# Patient Record
Sex: Male | Born: 1950 | Race: White | Hispanic: No | Marital: Married | Smoking: Former smoker
Health system: Southern US, Community
[De-identification: ages and names within clinical notes are randomized; demographics above are authoritative.]

## PROBLEM LIST (undated history)

## (undated) DIAGNOSIS — K589 Irritable bowel syndrome without diarrhea: Secondary | ICD-10-CM

## (undated) DIAGNOSIS — K219 Gastro-esophageal reflux disease without esophagitis: Secondary | ICD-10-CM

## (undated) DIAGNOSIS — R7303 Prediabetes: Secondary | ICD-10-CM

## (undated) DIAGNOSIS — R0989 Other specified symptoms and signs involving the circulatory and respiratory systems: Secondary | ICD-10-CM

## (undated) DIAGNOSIS — J449 Chronic obstructive pulmonary disease, unspecified: Secondary | ICD-10-CM

## (undated) DIAGNOSIS — Z8639 Personal history of other endocrine, nutritional and metabolic disease: Secondary | ICD-10-CM

## (undated) DIAGNOSIS — D126 Benign neoplasm of colon, unspecified: Secondary | ICD-10-CM

## (undated) DIAGNOSIS — E785 Hyperlipidemia, unspecified: Secondary | ICD-10-CM

## (undated) DIAGNOSIS — E559 Vitamin D deficiency, unspecified: Secondary | ICD-10-CM

## (undated) HISTORY — DX: Vitamin D deficiency, unspecified: E55.9

## (undated) HISTORY — DX: Personal history of other endocrine, nutritional and metabolic disease: Z86.39

## (undated) HISTORY — DX: Other specified symptoms and signs involving the circulatory and respiratory systems: R09.89

## (undated) HISTORY — DX: Prediabetes: R73.03

## (undated) HISTORY — PX: TESTICLE SURGERY: SHX794

## (undated) HISTORY — DX: Irritable bowel syndrome, unspecified: K58.9

## (undated) HISTORY — DX: Chronic obstructive pulmonary disease, unspecified: J44.9

## (undated) HISTORY — DX: Benign neoplasm of colon, unspecified: D12.6

## (undated) HISTORY — DX: Gastro-esophageal reflux disease without esophagitis: K21.9

---

## 1998-04-06 ENCOUNTER — Ambulatory Visit (HOSPITAL_COMMUNITY): Admission: RE | Admit: 1998-04-06 | Discharge: 1998-04-06 | Payer: Self-pay | Admitting: Internal Medicine

## 1998-08-27 ENCOUNTER — Ambulatory Visit (HOSPITAL_COMMUNITY): Admission: RE | Admit: 1998-08-27 | Discharge: 1998-08-27 | Payer: Self-pay | Admitting: Urology

## 1999-09-11 ENCOUNTER — Encounter: Payer: Self-pay | Admitting: Internal Medicine

## 1999-09-11 ENCOUNTER — Ambulatory Visit (HOSPITAL_COMMUNITY): Admission: RE | Admit: 1999-09-11 | Discharge: 1999-09-11 | Payer: Self-pay | Admitting: Internal Medicine

## 2001-10-25 ENCOUNTER — Ambulatory Visit (HOSPITAL_COMMUNITY): Admission: RE | Admit: 2001-10-25 | Discharge: 2001-10-25 | Payer: Self-pay | Admitting: Internal Medicine

## 2001-10-25 ENCOUNTER — Encounter: Payer: Self-pay | Admitting: Internal Medicine

## 2004-12-30 ENCOUNTER — Ambulatory Visit (HOSPITAL_COMMUNITY): Admission: RE | Admit: 2004-12-30 | Discharge: 2004-12-30 | Payer: Self-pay | Admitting: Internal Medicine

## 2005-12-31 ENCOUNTER — Ambulatory Visit (HOSPITAL_COMMUNITY): Admission: RE | Admit: 2005-12-31 | Discharge: 2005-12-31 | Payer: Self-pay | Admitting: Internal Medicine

## 2006-09-01 HISTORY — PX: BASAL CELL CARCINOMA EXCISION: SHX1214

## 2008-10-19 ENCOUNTER — Encounter (INDEPENDENT_AMBULATORY_CARE_PROVIDER_SITE_OTHER): Payer: Self-pay | Admitting: *Deleted

## 2011-03-06 ENCOUNTER — Other Ambulatory Visit (HOSPITAL_COMMUNITY): Payer: Self-pay | Admitting: Internal Medicine

## 2011-03-06 ENCOUNTER — Ambulatory Visit (HOSPITAL_COMMUNITY)
Admission: RE | Admit: 2011-03-06 | Discharge: 2011-03-06 | Disposition: A | Discharge status: Home / Self Care | Payer: Managed Care, Other (non HMO) | Source: Ambulatory Visit | Attending: Internal Medicine | Admitting: Internal Medicine

## 2011-03-06 DIAGNOSIS — R0602 Shortness of breath: Secondary | ICD-10-CM | POA: Insufficient documentation

## 2011-03-06 DIAGNOSIS — R059 Cough, unspecified: Secondary | ICD-10-CM | POA: Insufficient documentation

## 2011-03-06 DIAGNOSIS — J4489 Other specified chronic obstructive pulmonary disease: Secondary | ICD-10-CM | POA: Insufficient documentation

## 2011-03-06 DIAGNOSIS — J449 Chronic obstructive pulmonary disease, unspecified: Secondary | ICD-10-CM | POA: Insufficient documentation

## 2011-03-06 DIAGNOSIS — R05 Cough: Secondary | ICD-10-CM

## 2011-03-06 DIAGNOSIS — F17200 Nicotine dependence, unspecified, uncomplicated: Secondary | ICD-10-CM | POA: Insufficient documentation

## 2011-11-07 ENCOUNTER — Encounter: Payer: Self-pay | Admitting: Gastroenterology

## 2012-06-10 ENCOUNTER — Other Ambulatory Visit (HOSPITAL_COMMUNITY): Payer: Self-pay | Admitting: Internal Medicine

## 2012-06-10 ENCOUNTER — Ambulatory Visit (HOSPITAL_COMMUNITY)
Admission: RE | Admit: 2012-06-10 | Discharge: 2012-06-10 | Disposition: A | Discharge status: Home / Self Care | Payer: 59 | Source: Ambulatory Visit | Attending: Internal Medicine | Admitting: Internal Medicine

## 2012-06-10 DIAGNOSIS — J449 Chronic obstructive pulmonary disease, unspecified: Secondary | ICD-10-CM | POA: Insufficient documentation

## 2012-06-10 DIAGNOSIS — R911 Solitary pulmonary nodule: Secondary | ICD-10-CM | POA: Insufficient documentation

## 2012-06-10 DIAGNOSIS — J4489 Other specified chronic obstructive pulmonary disease: Secondary | ICD-10-CM | POA: Insufficient documentation

## 2012-06-10 DIAGNOSIS — I10 Essential (primary) hypertension: Secondary | ICD-10-CM

## 2012-06-10 DIAGNOSIS — Z0000 Encounter for general adult medical examination without abnormal findings: Secondary | ICD-10-CM

## 2012-06-10 DIAGNOSIS — F17200 Nicotine dependence, unspecified, uncomplicated: Secondary | ICD-10-CM | POA: Insufficient documentation

## 2012-06-11 ENCOUNTER — Other Ambulatory Visit (HOSPITAL_COMMUNITY): Payer: Self-pay | Admitting: Internal Medicine

## 2012-06-11 ENCOUNTER — Ambulatory Visit (HOSPITAL_COMMUNITY)
Admission: RE | Admit: 2012-06-11 | Discharge: 2012-06-11 | Disposition: A | Discharge status: Home / Self Care | Payer: PRIVATE HEALTH INSURANCE | Source: Ambulatory Visit | Attending: Internal Medicine | Admitting: Internal Medicine

## 2012-06-11 DIAGNOSIS — R918 Other nonspecific abnormal finding of lung field: Secondary | ICD-10-CM

## 2012-06-11 DIAGNOSIS — R911 Solitary pulmonary nodule: Secondary | ICD-10-CM | POA: Insufficient documentation

## 2012-12-06 ENCOUNTER — Encounter: Payer: Self-pay | Admitting: Gastroenterology

## 2013-06-27 ENCOUNTER — Other Ambulatory Visit: Payer: Self-pay | Admitting: Internal Medicine

## 2013-06-27 ENCOUNTER — Ambulatory Visit (HOSPITAL_COMMUNITY)
Admission: RE | Admit: 2013-06-27 | Discharge: 2013-06-27 | Disposition: A | Discharge status: Home / Self Care | Payer: BC Managed Care – PPO | Source: Ambulatory Visit | Attending: Internal Medicine | Admitting: Internal Medicine

## 2013-06-27 DIAGNOSIS — J449 Chronic obstructive pulmonary disease, unspecified: Secondary | ICD-10-CM | POA: Insufficient documentation

## 2013-06-27 DIAGNOSIS — J4489 Other specified chronic obstructive pulmonary disease: Secondary | ICD-10-CM | POA: Insufficient documentation

## 2013-06-27 DIAGNOSIS — J984 Other disorders of lung: Secondary | ICD-10-CM | POA: Insufficient documentation

## 2013-06-27 DIAGNOSIS — I10 Essential (primary) hypertension: Secondary | ICD-10-CM

## 2013-06-27 DIAGNOSIS — R0989 Other specified symptoms and signs involving the circulatory and respiratory systems: Secondary | ICD-10-CM | POA: Insufficient documentation

## 2013-06-30 ENCOUNTER — Encounter: Payer: Self-pay | Admitting: Gastroenterology

## 2013-09-06 ENCOUNTER — Ambulatory Visit (AMBULATORY_SURGERY_CENTER): Payer: Self-pay

## 2013-09-06 VITALS — Ht 64.0 in | Wt 170.4 lb

## 2013-09-06 DIAGNOSIS — Z1211 Encounter for screening for malignant neoplasm of colon: Secondary | ICD-10-CM

## 2013-09-06 MED ORDER — MOVIPREP 100 G PO SOLR: ORAL | Status: DC

## 2013-09-09 ENCOUNTER — Encounter: Payer: Self-pay | Admitting: Gastroenterology

## 2013-09-14 ENCOUNTER — Other Ambulatory Visit: Payer: Self-pay | Admitting: Emergency Medicine

## 2013-09-14 DIAGNOSIS — J449 Chronic obstructive pulmonary disease, unspecified: Secondary | ICD-10-CM

## 2013-09-14 MED ORDER — FORMOTEROL FUMARATE 20 MCG/2ML IN NEBU: INHALATION_SOLUTION | RESPIRATORY_TRACT | Status: DC

## 2013-09-20 ENCOUNTER — Ambulatory Visit (AMBULATORY_SURGERY_CENTER): Payer: BC Managed Care – PPO | Admitting: Gastroenterology

## 2013-09-20 ENCOUNTER — Encounter: Payer: Self-pay | Admitting: Gastroenterology

## 2013-09-20 VITALS — BP 98/67 | HR 75 | Temp 96.8°F | Resp 17 | Ht 64.0 in | Wt 170.0 lb

## 2013-09-20 DIAGNOSIS — Z1211 Encounter for screening for malignant neoplasm of colon: Secondary | ICD-10-CM

## 2013-09-20 DIAGNOSIS — D126 Benign neoplasm of colon, unspecified: Secondary | ICD-10-CM

## 2013-09-20 MED ORDER — SODIUM CHLORIDE 0.9 % IV SOLN: 500.0000 mL | INTRAVENOUS | Status: DC

## 2013-09-20 NOTE — Op Note (Signed)
[place]  [address] [place], [id number]   COLONOSCOPY PROCEDURE REPORT  PATIENT: Douglas Barrera, Douglas Barrera  MR#: [id number] BIRTHDATE: [birth year] , 62  yrs. old GENDER: Male ENDOSCOPIST: Ladene Artist, MD, [facility] REFERRED UX:[id number] Melford Aase, M.D. PROCEDURE DATE:  09/20/2013 PROCEDURE:   Colonoscopy with snare polypectomy First Screening Colonoscopy - Avg.  risk and is 50 yrs.  old or older - No.  Prior Negative Screening - Now for repeat screening. 10 or more years since last screening  History of Adenoma - Now for follow-up colonoscopy & has been > or = to 3 yrs.  N/A  Polyps Removed Today? Yes. ASA CLASS:   Class II INDICATIONS:average risk screening. MEDICATIONS: MAC sedation, administered by CRNA and propofol (Diprivan) 350mg  IV DESCRIPTION OF PROCEDURE:   After the risks benefits and alternatives of the procedure were thoroughly explained, informed consent was obtained.  A digital rectal exam revealed no abnormalities of the rectum.   The     endoscope was introduced through the anus and advanced to the cecum, which was identified by both the appendix and ileocecal valve. No adverse events experienced.   The quality of the prep was good, using MoviPrep The instrument was then slowly withdrawn as the colon was fully examined.  COLON FINDINGS: A sessile polyp measuring 7 mm in size was found in the transverse colon.  A polypectomy was performed with a cold snare.  The resection was complete and the polyp tissue was completely retrieved. Moderate diverticulosis was noted in the descending colon and sigmoid colon.   The colon was otherwise normal.  There was no diverticulosis, inflammation, polyps or cancers unless previously stated. Retroflexed views revealed mdoerate internal hemorrhoids. The time to cecum=1 minutes 22 seconds.  Withdrawal time=11 minutes 48 seconds.  The scope was withdrawn and the procedure completed. COMPLICATIONS: There were no  complications.  ENDOSCOPIC IMPRESSION: 1.   Sessile polyp measuring 7 mm in the transverse colon; polypectomy performed with a cold snare 2.   Moderate diverticulosis in the descending colon and sigmoid colon 3.   Moderate internal hemorrhoids  RECOMMENDATIONS: 1.  Await pathology results 2.  Repeat colonoscopy in 5 years if polyp adenomatous; otherwise 10 years 3.  High fiber diet with liberal fluid intake.  eSigned:  Ladene Artist, MD, [facility] 09/20/2013 8:52 AM

## 2013-09-20 NOTE — Progress Notes (Signed)
Report to pacu rn, vss, bbs=clear 

## 2013-09-20 NOTE — Patient Instructions (Signed)

## 2013-09-20 NOTE — Progress Notes (Signed)
Called to room to assist during endoscopic procedure.  Patient ID and intended procedure confirmed with present staff. Received instructions for my participation in the procedure from the performing physician.  

## 2013-09-21 ENCOUNTER — Telehealth: Payer: Self-pay

## 2013-09-21 NOTE — Telephone Encounter (Signed)
  Follow up Call-  Call back number 09/20/2013  Post procedure Call Back phone  # [phone]  Permission to leave phone message Yes     Patient questions:  Do you have a fever, pain , or abdominal swelling? no Pain Score  0 *  Have you tolerated food without any problems? yes  Have you been able to return to your normal activities? yes  Do you have any questions about your discharge instructions: Diet   no Medications  no Follow up visit  no  Do you have questions or concerns about your Care? no  Actions: * If pain score is 4 or above: No action needed, pain <4.

## 2013-09-27 ENCOUNTER — Encounter: Payer: Self-pay | Admitting: Gastroenterology

## 2014-02-07 DIAGNOSIS — R0989 Other specified symptoms and signs involving the circulatory and respiratory systems: Secondary | ICD-10-CM | POA: Insufficient documentation

## 2014-02-07 DIAGNOSIS — K589 Irritable bowel syndrome without diarrhea: Secondary | ICD-10-CM | POA: Insufficient documentation

## 2014-02-07 DIAGNOSIS — E782 Mixed hyperlipidemia: Secondary | ICD-10-CM | POA: Insufficient documentation

## 2014-02-07 DIAGNOSIS — R7309 Other abnormal glucose: Secondary | ICD-10-CM | POA: Insufficient documentation

## 2014-02-07 DIAGNOSIS — E559 Vitamin D deficiency, unspecified: Secondary | ICD-10-CM | POA: Insufficient documentation

## 2014-02-07 DIAGNOSIS — J449 Chronic obstructive pulmonary disease, unspecified: Secondary | ICD-10-CM | POA: Insufficient documentation

## 2014-02-07 DIAGNOSIS — K219 Gastro-esophageal reflux disease without esophagitis: Secondary | ICD-10-CM | POA: Insufficient documentation

## 2014-02-08 ENCOUNTER — Encounter: Payer: Self-pay | Admitting: Internal Medicine

## 2014-02-08 ENCOUNTER — Other Ambulatory Visit: Payer: Self-pay

## 2014-02-08 ENCOUNTER — Ambulatory Visit (INDEPENDENT_AMBULATORY_CARE_PROVIDER_SITE_OTHER): Payer: BC Managed Care – PPO | Admitting: Internal Medicine

## 2014-02-08 VITALS — BP 102/64 | HR 100 | Temp 98.2°F | Resp 18 | Ht 65.25 in | Wt 169.4 lb

## 2014-02-08 DIAGNOSIS — Z79899 Other long term (current) drug therapy: Secondary | ICD-10-CM | POA: Insufficient documentation

## 2014-02-08 DIAGNOSIS — J4489 Other specified chronic obstructive pulmonary disease: Secondary | ICD-10-CM

## 2014-02-08 DIAGNOSIS — I10 Essential (primary) hypertension: Secondary | ICD-10-CM

## 2014-02-08 DIAGNOSIS — J449 Chronic obstructive pulmonary disease, unspecified: Secondary | ICD-10-CM

## 2014-02-08 DIAGNOSIS — R0989 Other specified symptoms and signs involving the circulatory and respiratory systems: Secondary | ICD-10-CM

## 2014-02-08 MED ORDER — FLUTICASONE PROPIONATE 50 MCG/ACT NA SUSP: 2.0000 | Freq: Every day | NASAL | Status: DC

## 2014-02-08 MED ORDER — ALBUTEROL SULFATE HFA 108 (90 BASE) MCG/ACT IN AERS: INHALATION_SPRAY | RESPIRATORY_TRACT | Status: DC

## 2014-02-08 NOTE — Patient Instructions (Signed)
Asthma Attack Prevention Although there is no way to prevent asthma from starting, you can take steps to control the disease and reduce its symptoms. Learn about your asthma and how to control it. Take an active role to control your asthma by working with your health care provider to create and follow an asthma action plan. An asthma action plan guides you in:  Taking your medicines properly.  Avoiding things that set off your asthma or make your asthma worse (asthma triggers).  Tracking your level of asthma control.  Responding to worsening asthma.  Seeking emergency care when needed. To track your asthma, keep records of your symptoms, check your peak flow number using a handheld device that shows how well air moves out of your lungs (peak flow meter), and get regular asthma checkups.  WHAT ARE SOME WAYS TO PREVENT AN ASTHMA ATTACK?  Take medicines as directed by your health care provider.  Keep track of your asthma symptoms and level of control.  With your health care provider, write a detailed plan for taking medicines and managing an asthma attack. Then be sure to follow your action plan. Asthma is an ongoing condition that needs regular monitoring and treatment.  Identify and avoid asthma triggers. Many outdoor allergens and irritants (such as pollen, mold, cold air, and air pollution) can trigger asthma attacks. Find out what your asthma triggers are and take steps to avoid them.  Monitor your breathing. Learn to recognize warning signs of an attack, such as coughing, wheezing, or shortness of breath. Your lung function may decrease before you notice any signs or symptoms, so regularly measure and record your peak airflow with a home peak flow meter.  Identify and treat attacks early. If you act quickly, you are less likely to have a severe attack. You will also need less medicine to control your symptoms. When your peak flow measurements decrease and alert you to an upcoming attack,  take your medicine as instructed and immediately stop any activity that may have triggered the attack. If your symptoms do not improve, get medical help.  Pay attention to increasing quick-relief inhaler use. If you find yourself relying on your quick-relief inhaler, your asthma is not under control. See your health care provider about adjusting your treatment. WHAT CAN MAKE MY SYMPTOMS WORSE? A number of common things can set off or make your asthma symptoms worse and cause temporary increased inflammation of your airways. Keep track of your asthma symptoms for several weeks, detailing all the environmental and emotional factors that are linked with your asthma. When you have an asthma attack, go back to your asthma diary to see which factor, or combination of factors, might have contributed to it. Once you know what these factors are, you can take steps to control many of them. If you have allergies and asthma, it is important to take asthma prevention steps at home. Minimizing contact with the substance to which you are allergic will help prevent an asthma attack. Some triggers and ways to avoid these triggers are: Animal Dander:  Some people are allergic to the flakes of skin or dried saliva from animals with fur or feathers.   There is no such thing as a hypoallergenic dog or cat breed. All dogs or cats can cause allergies, even if they don't shed.  Keep these pets out of your home.  If you are not able to keep a pet outdoors, keep the pet out of your bedroom and other sleeping areas at all   times, and keep the door closed.  Remove carpets and furniture covered with cloth from your home. If that is not possible, keep the pet away from fabric-covered furniture and carpets. Dust Mites: Many people with asthma are allergic to dust mites. Dust mites are tiny bugs that are found in every home in mattresses, pillows, carpets, fabric-covered furniture, bedcovers, clothes, stuffed toys, and other  fabric-covered items.   Cover your mattress in a special dust-proof cover.  Cover your pillow in a special dust-proof cover, or wash the pillow each week in hot water. Water must be hotter than 130 F (54.4 C) to kill dust mites. Cold or warm water used with detergent and bleach can also be effective.  Wash the sheets and blankets on your bed each week in hot water.  Try not to sleep or lie on cloth-covered cushions.  Call ahead when traveling and ask for a smoke-free hotel room. Bring your own bedding and pillows in case the hotel only supplies feather pillows and down comforters, which may contain dust mites and cause asthma symptoms.  Remove carpets from your bedroom and those laid on concrete, if you can.  Keep stuffed toys out of the bed, or wash the toys weekly in hot water or cooler water with detergent and bleach. Cockroaches: Many people with asthma are allergic to the droppings and remains of cockroaches.   Keep food and garbage in closed containers. Never leave food out.  Use poison baits, traps, powders, gels, or paste (for example, boric acid).  If a spray is used to kill cockroaches, stay out of the room until the odor goes away. Indoor Mold:  Fix leaky faucets, pipes, or other sources of water that have mold around them.  Clean floors and moldy surfaces with a fungicide or diluted bleach.  Avoid using humidifiers, vaporizers, or swamp coolers. These can spread molds through the air. Pollen and Outdoor Mold:  When pollen or mold spore counts are high, try to keep your windows closed.  Stay indoors with windows closed from late morning to afternoon. Pollen and some mold spore counts are highest at that time.  Ask your health care provider whether you need to take anti-inflammatory medicine or increase your dose of the medicine before your allergy season starts. Other Irritants to Avoid:  Tobacco smoke is an irritant. If you smoke, ask your health care provider how  you can quit. Ask family members to quit smoking too. Do not allow smoking in your home or car.  If possible, do not use a wood-burning stove, kerosene heater, or fireplace. Minimize exposure to all sources of smoke, including to incense, candles, fires, and fireworks.  Try to stay away from strong odors and sprays, such as perfume, talcum powder, hair spray, and paints.  Decrease humidity in your home and use an indoor air cleaning device. Reduce indoor humidity to below 60%. Dehumidifiers or central air conditioners can do this.  Decrease house dust exposure by changing furnace and air cooler filters frequently.  Try to have someone else vacuum for you once or twice a week. Stay out of rooms while they are being vacuumed and for a short while afterward.  If you vacuum, use a dust mask from a hardware store, a double-layered or microfilter vacuum cleaner bag, or a vacuum cleaner with a HEPA filter.  Sulfites in foods and beverages can be irritants. Do not drink beer or wine or eat dried fruit, processed potatoes, or shrimp if they cause asthma symptoms.    Cold air can trigger an asthma attack. Cover your nose and mouth with a scarf on cold or windy days.  Several health conditions can make asthma more difficult to manage, including a runny nose, sinus infections, reflux disease, psychological stress, and sleep apnea. Work with your health care provider to manage these conditions.  Avoid close contact with people who have a respiratory infection such as a cold or the flu, since your asthma symptoms may get worse if you catch the infection. Wash your hands thoroughly after touching items that may have been handled by people with a respiratory infection.  Get a flu shot every year to protect against the flu virus, which often makes asthma worse for days or weeks. Also get a pneumonia shot if you have not previously had one. Unlike the flu shot, the pneumonia shot does not need to be given  yearly. Medicines:  Talk to your health care provider about whether it is safe for you to take aspirin or non-steroidal anti-inflammatory medicines (NSAIDs). In a small number of people with asthma, aspirin and NSAIDs can cause asthma attacks. These medicines must be avoided by people who have known aspirin-sensitive asthma. It is important that people with aspirin-sensitive asthma read labels of all over-the-counter medicines used to treat pain, colds, coughs, and fever.  Beta blockers and ACE inhibitors are other medicines you should discuss with your health care provider. HOW CAN I FIND OUT WHAT I AM ALLERGIC TO? Ask your asthma health care provider about allergy skin testing or blood testing (the RAST test) to identify the allergens to which you are sensitive. If you are found to have allergies, the most important thing to do is to try to avoid exposure to any allergens that you are sensitive to as much as possible. Other treatments for allergies, such as medicines and allergy shots (immunotherapy) are available.  CAN I EXERCISE? Follow your health care provider's advice regarding asthma treatment before exercising. It is important to maintain a regular exercise program, but vigorous exercise, or exercise in cold, humid, or dry environments can cause asthma attacks, especially for those people who have exercise-induced asthma. Document Released: 08/06/2009 Document Revised: 04/20/2013 Document Reviewed: 02/23/2013 [facility] Patient Information 2014 [place].   Asthma, Adult Asthma is a recurring condition in which the airways tighten and narrow. Asthma can make it difficult to breathe. It can cause coughing, wheezing, and shortness of breath. Asthma episodes (also called asthma attacks) range from minor to life-threatening. Asthma cannot be cured, but medicines and lifestyle changes can help control it. CAUSES Asthma is believed to be caused by inherited (genetic) and environmental  factors, but its exact cause is unknown. Asthma may be triggered by allergens, lung infections, or irritants in the air. Asthma triggers are different for each person. Common triggers include:   Animal dander.  Dust mites.  Cockroaches.  Pollen from trees or grass.  Mold.  Smoke.  Air pollutants such as dust, household cleaners, hair sprays, aerosol sprays, paint fumes, strong chemicals, or strong odors.  Cold air, weather changes, and winds (which increase molds and pollens in the air).  Strong emotional expressions such as crying or laughing hard.  Stress.  Certain medicines (such as aspirin) or types of drugs (such as beta-blockers).  Sulfites in foods and drinks. Foods and drinks that may contain sulfites include dried fruit, potato chips, and sparkling grape juice.  Infections or inflammatory conditions such as the flu, a cold, or an inflammation of the nasal membranes (rhinitis).  Gastroesophageal reflux disease (GERD).  Exercise or strenuous activity. SYMPTOMS Symptoms may occur immediately after asthma is triggered or many hours later. Symptoms include:  Wheezing.  Excessive nighttime or early morning coughing.  Frequent or severe coughing with a common cold.  Chest tightness.  Shortness of breath. DIAGNOSIS  The diagnosis of asthma is made by a review of your medical history and a physical exam. Tests may also be performed. These may include:  Lung function studies. These tests show how much air you breath in and out.  Allergy tests.  Imaging tests such as X-rays. TREATMENT  Asthma cannot be cured, but it can usually be controlled. Treatment involves identifying and avoiding your asthma triggers. It also involves medicines. There are 2 classes of medicine used for asthma treatment:   Controller medicines. These prevent asthma symptoms from occurring. They are usually taken every day.  Reliever or rescue medicines. These quickly relieve asthma symptoms.  They are used as needed and provide short-term relief. Your health care provider will help you create an asthma action plan. An asthma action plan is a written plan for managing and treating your asthma attacks. It includes a list of your asthma triggers and how they may be avoided. It also includes information on when medicines should be taken and when their dosage should be changed. An action plan may also involve the use of a device called a peak flow meter. A peak flow meter measures how well the lungs are working. It helps you monitor your condition. HOME CARE INSTRUCTIONS   Take medicine as directed by your health care provider. Speak with your health care provider if you have questions about how or when to take the medicines.  Use a peak flow meter as directed by your health care provider. Record and keep track of readings.  Understand and use the action plan to help minimize or stop an asthma attack without needing to seek medical care.  Control your home environment in the following ways to help prevent asthma attacks:  Do not smoke. Avoid being exposed to secondhand smoke.  Change your heating and air conditioning filter regularly.  Limit your use of fireplaces and wood stoves.  Get rid of pests (such as roaches and mice) and their droppings.  Throw away plants if you see mold on them.  Clean your floors and dust regularly. Use unscented cleaning products.  Try to have someone else vacuum for you regularly. Stay out of rooms while they are being vacuumed and for a short while afterward. If you vacuum, use a dust mask from a hardware store, a double-layered or microfilter vacuum cleaner bag, or a vacuum cleaner with a HEPA filter.  Replace carpet with wood, tile, or vinyl flooring. Carpet can trap dander and dust.  Use allergy-proof pillows, mattress covers, and box spring covers.  Wash bed sheets and blankets every week in hot water and dry them in a dryer.  Use blankets  that are made of polyester or cotton.  Clean bathrooms and kitchens with bleach. If possible, have someone repaint the walls in these rooms with mold-resistant paint. Keep out of the rooms that are being cleaned and painted.  Wash hands frequently. SEEK MEDICAL CARE IF:   You have wheezing, shortness of breath, or a cough even if taking medicine to prevent attacks.  The colored mucus you cough up (sputum) is thicker than usual.  Your sputum changes from clear or white to yellow, green, gray, or bloody.  You have any  problems that may be related to the medicines you are taking (such as a rash, itching, swelling, or trouble breathing).  You are using a reliever medicine more than 2 3 times per week.  Your peak flow is still at 50 79% of you personal best after following your action plan for 1 hour. SEEK IMMEDIATE MEDICAL CARE IF:   You seem to be getting worse and are unresponsive to treatment during an asthma attack.  You are short of breath even at rest.  You get short of breath when doing very little physical activity.  You have difficulty eating, drinking, or talking due to asthma symptoms.  You develop chest pain.  You develop a fast heartbeat.  You have a bluish color to your lips or fingernails.  You are lightheaded, dizzy, or faint.  Your peak flow is less than 50% of your personal best.  You have a fever or persistent symptoms for more than 2 3 days.  You have a fever and symptoms suddenly get worse. MAKE SURE YOU:   Understand these instructions.  Will watch your condition.  Will get help right away if you are not doing well or get worse. Document Released: 08/18/2005 Document Revised: 04/20/2013 Document Reviewed: 03/17/2013 [facility] Patient Information 2014 [place], [place].

## 2014-02-08 NOTE — Progress Notes (Signed)
   Subjective:    Patient ID: Douglas Barrera, male    DOB: 10-29-1950, 63 y.o.   MRN: [id number]  HPI Patient presents with c/o recent flare of allergy/Asthma Sx's and his Nebulizer Machine has recently broken.Marland Kitchen He also requests refills of inhalers.    Medication List   albuterol 108 (90 BASE) MCG/ACT inhaler  Commonly known as:  PROVENTIL HFA;VENTOLIN HFA  Inhale  1 to 2 puffs 4 x day or every 4 hours to rescue asthma     aspirin 81 MG tablet  Take 81 mg by mouth daily.     diphenhydrAMINE 25 MG tablet  Commonly known as:  BENADRYL  Take 25 mg by mouth. Take 2 tablets bid     fluticasone 50 MCG/ACT nasal spray  Commonly known as:  FLONASE  Place 2 sprays into both nostrils daily.     formoterol 20 MCG/2ML nebulizer solution  Commonly known as:  PERFOROMIST  41ml vial solution BID in nebulizer     guaifenesin 400 MG Tabs tablet  Commonly known as:  HUMIBID E  Take 400 mg by mouth 2 (two) times daily.     Vitamin D3 10000 UNITS capsule  Take 10,000 Units by mouth daily.      No Known Allergies  Past Medical History  Diagnosis Date  . COPD (chronic obstructive pulmonary disease)   . Labile hypertension   . History of elevated lipids   . GERD (gastroesophageal reflux disease)   . Prediabetes   . IBS (irritable bowel syndrome)   . Vitamin D deficiency    Review of Systems In addition to the HPI above,  No Fever-chills,  No Headache, No changes with Vision or hearing,  No problems swallowing food or Liquids,  No Chest pain or productive Cough,  No Abdominal pain, No Nausea or Vommitting, Bowel movements are regular,  No new skin rashes or bruises,  No new joints pains-aches,  Alleges  weight loss,  No polyuria, polydypsia or polyphagia,  No significant Mental Stressors.  A full 10 point Review of Systems was done, except as stated above, all other Review of Systems were negative  Objective:   Physical Exam  BP 102/64  Pulse 100  Temp(Src) 98.2 F (36.8 C)  (Temporal)  Resp 18  Ht 5' 5.25" (1.657 m)  Wt 169 lb 6.4 oz (76.839 kg)  BMI 27.99 kg/m2  HEENT - Eac's patent. TM's Nl.EOM's full. PERRLA. NasoOroPharynx clear. Neck - supple. Nl Thyroid. No bruits nodes JVD Chest - Clear equal BS Cor - Nl HS. RRR w/o sig MGR. PP 1(+) No edema. Abd - No palpable organomegaly, masses or tenderness. BS nl. MS- FROM. w/o deformities. Muscle power tone and bulk Nl. Gait Nl. Neuro - No obvious Cr N abnormalities. Sensory, motor and Cerebellar functions appear Nl w/o focal abnormalities.  Assessment & Plan:   1. Labile hypertension  2. COPD (chronic obstructive pulmonary disease)  3. Encounter for long-term (current) use of other medications  Refill Rx's for Albuterol & flonase and given Sx's of Breo to try (14 doses) ROV prn

## 2014-05-19 ENCOUNTER — Other Ambulatory Visit: Payer: Self-pay | Admitting: Internal Medicine

## 2014-06-16 ENCOUNTER — Other Ambulatory Visit: Payer: Self-pay

## 2014-06-20 ENCOUNTER — Ambulatory Visit (INDEPENDENT_AMBULATORY_CARE_PROVIDER_SITE_OTHER): Payer: BC Managed Care – PPO | Admitting: Internal Medicine

## 2014-06-20 ENCOUNTER — Encounter: Payer: Self-pay | Admitting: Internal Medicine

## 2014-06-20 VITALS — BP 114/82 | HR 80 | Temp 98.1°F | Resp 16 | Ht 64.75 in | Wt 167.8 lb

## 2014-06-20 DIAGNOSIS — Z125 Encounter for screening for malignant neoplasm of prostate: Secondary | ICD-10-CM

## 2014-06-20 DIAGNOSIS — R7989 Other specified abnormal findings of blood chemistry: Secondary | ICD-10-CM

## 2014-06-20 DIAGNOSIS — Z79899 Other long term (current) drug therapy: Secondary | ICD-10-CM

## 2014-06-20 DIAGNOSIS — R945 Abnormal results of liver function studies: Secondary | ICD-10-CM

## 2014-06-20 DIAGNOSIS — Z111 Encounter for screening for respiratory tuberculosis: Secondary | ICD-10-CM

## 2014-06-20 DIAGNOSIS — R7303 Prediabetes: Secondary | ICD-10-CM

## 2014-06-20 DIAGNOSIS — E782 Mixed hyperlipidemia: Secondary | ICD-10-CM

## 2014-06-20 DIAGNOSIS — I10 Essential (primary) hypertension: Secondary | ICD-10-CM

## 2014-06-20 DIAGNOSIS — K219 Gastro-esophageal reflux disease without esophagitis: Secondary | ICD-10-CM

## 2014-06-20 DIAGNOSIS — Z1211 Encounter for screening for malignant neoplasm of colon: Secondary | ICD-10-CM

## 2014-06-20 DIAGNOSIS — R0989 Other specified symptoms and signs involving the circulatory and respiratory systems: Secondary | ICD-10-CM

## 2014-06-20 DIAGNOSIS — Z23 Encounter for immunization: Secondary | ICD-10-CM

## 2014-06-20 DIAGNOSIS — R6889 Other general symptoms and signs: Secondary | ICD-10-CM

## 2014-06-20 DIAGNOSIS — Z0001 Encounter for general adult medical examination with abnormal findings: Secondary | ICD-10-CM

## 2014-06-20 DIAGNOSIS — K589 Irritable bowel syndrome without diarrhea: Secondary | ICD-10-CM

## 2014-06-20 DIAGNOSIS — Z113 Encounter for screening for infections with a predominantly sexual mode of transmission: Secondary | ICD-10-CM

## 2014-06-20 DIAGNOSIS — E559 Vitamin D deficiency, unspecified: Secondary | ICD-10-CM

## 2014-06-20 LAB — CBC WITH DIFFERENTIAL/PLATELET
Basophils Absolute: 0 10*3/uL (ref 0.0–0.1)
Basophils Relative: 0 % (ref 0–1)
Eosinophils Absolute: 0.1 10*3/uL (ref 0.0–0.7)
Eosinophils Relative: 1 % (ref 0–5)
HCT: 48.9 % (ref 39.0–52.0)
Hemoglobin: 17.3 g/dL — ABNORMAL HIGH (ref 13.0–17.0)
Lymphocytes Relative: 30 % (ref 12–46)
Lymphs Abs: 2.3 10*3/uL (ref 0.7–4.0)
MCH: 32.3 pg (ref 26.0–34.0)
MCHC: 35.4 g/dL (ref 30.0–36.0)
MCV: 91.4 fL (ref 78.0–100.0)
Monocytes Absolute: 0.9 10*3/uL (ref 0.1–1.0)
Monocytes Relative: 12 % (ref 3–12)
Neutro Abs: 4.3 10*3/uL (ref 1.7–7.7)
Neutrophils Relative %: 57 % (ref 43–77)
Platelets: 225 10*3/uL (ref 150–400)
RBC: 5.35 MIL/uL (ref 4.22–5.81)
RDW: 13.2 % (ref 11.5–15.5)
WBC: 7.5 10*3/uL (ref 4.0–10.5)

## 2014-06-20 LAB — HEMOGLOBIN A1C
Hgb A1c MFr Bld: 6 % — ABNORMAL HIGH (ref <5.7)
Mean Plasma Glucose: 126 mg/dL — ABNORMAL HIGH (ref <117)

## 2014-06-20 MED ORDER — ALBUTEROL SULFATE HFA 108 (90 BASE) MCG/ACT IN AERS: INHALATION_SPRAY | RESPIRATORY_TRACT | Status: DC

## 2014-06-20 NOTE — Progress Notes (Signed)
Patient ID: Douglas Barrera, male   DOB: 04/30/1951, 63 y.o.   MRN: [id number]  Annual Screening/ Preventative Comprehensive Examination  This very nice 63 y.o.MWM presents for complete physical.  Patient has been followed for HTN, COPD,  Prediabetes, Hyperlipidemia, and Vitamin D Deficiency. Patient's COPD is stable and symptomatically improved since essentially stopping his cigar smoking. He does admit occasional cough productive of a clear sputum.   Labile HTN predates since 1997 and has been monitored expectantly. Patient's BP has been controlled at home.Today's BP: 114/82 mmHg. Patient had a Neg Cardiolite in 2003 by Dr Ron Parker.  Patient denies any cardiac symptoms as chest pain, palpitations, shortness of breath, dizziness or ankle swelling.   Patient's hyperlipidemia is controlled with diet and medications. Patient denies myalgias or other medication SE's. Last TC 182, HDL 39, LDL 112 - near goal and TG 155.    Patient has prediabetes since Oct 2010 (A1c 6.0%)  and patient denies reactive hypoglycemic symptoms, visual blurring, diabetic polys or paresthesias. Last A1c was 6.2% in Oct 2014   Finally, patient has history of Vitamin D Deficiency of 24 in 2008 and last vitamin D was 31 in Oct 2014.  Medication Sig  . albuterol HFA  inhaler Inhale  1 to 2 puffs 4 x day or every 4 hours to rescue asthma  . aspirin 81 MG tablet Take 81 mg by mouth daily.  Marland Kitchen VITAMIN D 24401 UNITS capsule Take 10,000 Units by mouth daily.  Marland Kitchen BENADRYL 25 MG tablet Take 25 mg by mouth. Take 2 tablets bid  . fluticasone (FLONASE) nasal spray USE 2 SPRAYS IN EACH       NOSTRIL DAILY  . PERFOROMIST 20 MCG/2ML neb soln 48ml vial solution BID in nebulizer  . guaifenesin 400 MG TABS  Take 400 mg by mouth 2 (two) times daily.   No Known Allergies  Past Medical History  Diagnosis Date  . COPD (chronic obstructive pulmonary disease)   . Labile hypertension   . History of elevated lipids   . GERD (gastroesophageal reflux  disease)   . Prediabetes   . IBS (irritable bowel syndrome)   . Vitamin D deficiency    Health Maintenance  Topic Date Due  . Zostavax  12/17/2011  . Influenza Vaccine  04/01/2014  . Colonoscopy  09/27/2018  . Tetanus/tdap  09/02/2019   Immunization History  Administered Date(s) Administered  . Influenza Split 06/20/2013, 06/20/2014  . PPD Test 06/20/2014  . Pneumococcal Conjugate-13 06/20/2014  . Pneumococcal Polysaccharide-23 04/30/2009  . Tdap 09/01/2009   Past Surgical History  Procedure Laterality Date  . Testicle surgery      testicle removed  . Basal cell carcinoma excision  2008    Left [place]   Family History  Problem Relation Age of Onset  . Breast cancer Mother   . Stomach cancer Mother    History   Social History  . Marital Status: Married    Spouse Name: N/A    Number of Children: N/A  . Years of Education: N/A   Occupational History  . Not on file.   Social History Main Topics  . Smoking status: Current Every Day Smoker -- 0.50 packs/day    Types: Cigarettes  . Smokeless tobacco: Never Used  . Alcohol Use: No  . Drug Use: No  . Sexual Activity: Not on file   ROS Constitutional: Denies fever, chills, weight loss/gain, headaches, insomnia, fatigue, night sweats or change in appetite. Eyes: Denies redness, blurred vision, diplopia, discharge,  itchy or watery eyes.  ENT: Denies discharge, congestion, post nasal drip, epistaxis, sore throat, earache, hearing loss, dental pain, Tinnitus, Vertigo, Sinus pain or snoring.  Cardio: Denies chest pain, palpitations, irregular heartbeat, syncope, dyspnea, diaphoresis, orthopnea, PND, claudication or edema Respiratory: denies cough, dyspnea, DOE, pleurisy, hoarseness, laryngitis or wheezing.  Gastrointestinal: Denies dysphagia, heartburn, reflux, water brash, pain, cramps, nausea, vomiting, bloating, diarrhea, constipation, hematemesis, melena, hematochezia, jaundice or hemorrhoids Genitourinary: Denies  dysuria, frequency, urgency, nocturia, hesitancy, discharge, hematuria or flank pain Musculoskeletal: Denies arthralgia, myalgia, stiffness, Jt. Swelling, pain, limp or strain/sprain. Denies Falls. Skin: Denies puritis, rash, hives, warts, acne, eczema or change in skin lesion Neuro: No weakness, tremor, incoordination, spasms, paresthesia or pain Psychiatric: Denies confusion, memory loss or sensory loss. Denies Depression. Endocrine: Denies change in weight, skin, hair change, nocturia, and paresthesia, diabetic polys, visual blurring or hyper / hypo glycemic episodes.  Heme/Lymph: No excessive bleeding, bruising or enlarged lymph nodes.  Physical Exam  BP 114/82  Pulse 80  Temp 98.1 F   Resp 16  Ht 5' 4.75"   Wt 167 lb 12.8 oz   BMI 28.13   General Appearance: Well nourished, in no apparent distress. Eyes: PERRLA, EOMs, conjunctiva no swelling or erythema, normal fundi and vessels. Sinuses: No frontal/maxillary tenderness ENT/Mouth: EACs patent / TMs  nl. Nares clear without erythema, swelling, mucoid exudates. Oral hygiene is good. No erythema, swelling, or exudate. Tongue normal, non-obstructing. Tonsils not swollen or erythematous. Hearing normal.  Neck: Supple, thyroid normal. No bruits, nodes or JVD. Respiratory: Respiratory effort normal.  BS equal and clear bilateral without rales, rhonci, wheezing or stridor. Cardio: Heart sounds are normal with regular rate and rhythm and no murmurs, rubs or gallops. Peripheral pulses are normal and equal bilaterally without edema. No aortic or femoral bruits. Chest: symmetric with normal excursions and percussion.  Abdomen: Flat, soft, with bowl sounds. Nontender, no guarding, rebound, hernias, masses, or organomegaly.  Lymphatics: Non tender without lymphadenopathy.  Genitourinary: No hernias.Testes nl. DRE - prostate nl for age - smooth & firm w/o nodules. Musculoskeletal: Full ROM all peripheral extremities, joint stability, 5/5  strength, and normal gait. Skin: Warm and dry without rashes, lesions, cyanosis, clubbing or  ecchymosis.  Neuro: Cranial nerves intact, reflexes equal bilaterally. Normal muscle tone, no cerebellar symptoms. Sensation intact.  Pysch: Awake and oriented X 3 with normal affect, insight and judgment appropriate.   Assessment and Plan  1. Encounter for general adult medical examination with abnormal findings  - Urine Microscopic - EKG 12-Lead - Vitamin B12 - Testosterone - CBC with Differential - BASIC METABOLIC PANEL WITH GFR - Hepatic function panel - Magnesium - Lipid panel - TSH - Hemoglobin A1c - Insulin, fasting  2. Labile hypertension  - Microalbumin / creatinine urine ratio - [place], RETROPERITNL ABD,  LTD  3. Hyperlipidemia  4. Prediabetes  5. Vitamin D deficiency  - Vit D  25 hydroxy (rtn osteoporosis monitoring)  6. IBS (irritable bowel syndrome)   7. Gastroesophageal reflux disease, esophagitis presence not specified   8. Special screening for malignant neoplasms, colon  - POC Hemoccult Bld/Stl (3-Cd Home Screen); Future  9. Screening for prostate cancer  - PSA  10. Abnormal LFTs  11. Screening for venereal disease (VD)  41. Need for prophylactic vaccination and inoculation against influenza  - Flu vaccine greater than or equal to 3yo with preservative IM  13. Screening examination for pulmonary tuberculosis  - TB Skin Test  14. Need for prophylactic vaccination against Streptococcus  pneumoniae (pneumococcus)  - Pneumococcal conjugate vaccine 13-valent  15. COPD - Recc CXR   Continue prudent diet as discussed, weight control, BP monitoring, regular exercise, and medications as discussed.  Discussed med effects and SE's. Routine screening labs and tests as requested with regular follow-up as recommended.

## 2014-06-20 NOTE — Patient Instructions (Signed)
Recommend the book "The END of DIETING" by Dr Baker Janus   and the book "The END of DIABETES " by Dr Excell Seltzer  At [email or web address] - get book & Audio CD's      Being diabetic has a  300% increased risk for heart attack, stroke, cancer, and alzheimer- type vascular dementia. It is very important that you work harder with diet by avoiding all foods that are white except chicken & fish. Avoid white rice (brown & wild rice is OK), white potatoes (sweetpotatoes in moderation is OK), White bread or wheat bread or anything made out of white flour like bagels, donuts, rolls, buns, biscuits, cakes, pastries, cookies, pizza crust, and pasta (made from white flour & egg whites) - vegetarian pasta or spinach or wheat pasta is OK. Multigrain breads like Arnold's or Pepperidge [employer], or multigrain sandwich thins or flatbreads.  Diet, exercise and weight loss can reverse and cure diabetes in the early stages.  Diet, exercise and weight loss is very important in the control and prevention of complications of diabetes which affects every system in your body, ie. Brain - dementia/stroke, eyes - glaucoma/blindness, heart - heart attack/heart failure, kidneys - dialysis, stomach - gastric paralysis, intestines - malabsorption, nerves - severe painful neuritis, circulation - gangrene & loss of a leg(s), and finally cancer and Alzheimers.    I recommend avoid fried & greasy foods,  sweets/candy, white rice (brown or wild rice or Quinoa is OK), white potatoes (sweet potatoes are OK) - anything made from white flour - bagels, doughnuts, rolls, buns, biscuits,white and wheat breads, pizza crust and traditional pasta made of white flour & egg white(vegetarian pasta or spinach or wheat pasta is OK).  Multi-grain bread is OK - like multi-grain flat bread or sandwich thins. Avoid alcohol in excess. Exercise is also important.    Eat all the vegetables you want - avoid meat, especially red meat and dairy - especially cheese.  Cheese  is the most concentrated form of trans-fats which is the worst thing to clog up our arteries. Veggie cheese is OK which can be found in the fresh produce section at [employer] or [employer] or [employer]  Preventive Care for Adults A healthy lifestyle and preventive care can promote health and wellness. Preventive health guidelines for men include the following key practices:  A routine yearly physical is a good way to check with your health care provider about your health and preventative screening. It is a chance to share any concerns and updates on your health and to receive a thorough exam.  Visit your dentist for a routine exam and preventative care every 6 months. Brush your teeth twice a day and floss once a day. Good oral hygiene prevents tooth decay and gum disease.  The frequency of eye exams is based on your age, health, family medical history, use of contact lenses, and other factors. Follow your health care provider's recommendations for frequency of eye exams.  Eat a healthy diet. Foods such as vegetables, fruits, whole grains, low-fat dairy products, and lean protein foods contain the nutrients you need without too many calories. Decrease your intake of foods high in solid fats, added sugars, and salt. Eat the right amount of calories for you.Get information about a proper diet from your health care provider, if necessary.  Regular physical exercise is one of the most important things you can do for your health. Most adults should get at least 150 minutes of moderate-intensity exercise (any activity that  increases your heart rate and causes you to sweat) each week. In addition, most adults need muscle-strengthening exercises on 2 or more days a week.  Maintain a healthy weight. The body mass index (BMI) is a screening tool to identify possible weight problems. It provides an estimate of body fat based on height and weight. Your health care provider can find your BMI and can help you  achieve or maintain a healthy weight.For adults 20 years and older:  A BMI below 18.5 is considered underweight.  A BMI of 18.5 to 24.9 is normal.  A BMI of 25 to 29.9 is considered overweight.  A BMI of 30 and above is considered obese.  Maintain normal blood lipids and cholesterol levels by exercising and minimizing your intake of saturated fat. Eat a balanced diet with plenty of fruit and vegetables. Blood tests for lipids and cholesterol should begin at age 20 and be repeated every 5 years. If your lipid or cholesterol levels are high, you are over 50, or you are at high risk for heart disease, you may need your cholesterol levels checked more frequently.Ongoing high lipid and cholesterol levels should be treated with medicines if diet and exercise are not working.  If you smoke, find out from your health care provider how to quit. If you do not use tobacco, do not start.  Lung cancer screening is recommended for adults aged 72-80 years who are at high risk for developing lung cancer because of a history of smoking. A yearly low-dose CT scan of the lungs is recommended for people who have at least a 30-pack-year history of smoking and are a current smoker or have quit within the past 15 years. A pack year of smoking is smoking an average of 1 pack of cigarettes a day for 1 year (for example: 1 pack a day for 30 years or 2 packs a day for 15 years). Yearly screening should continue until the smoker has stopped smoking for at least 15 years. Yearly screening should be stopped for people who develop a health problem that would prevent them from having lung cancer treatment.  If you choose to drink alcohol, do not have more than 2 drinks per day. One drink is considered to be 12 ounces (355 mL) of beer, 5 ounces (148 mL) of wine, or 1.5 ounces (44 mL) of liquor.  Avoid use of street drugs. Do not share needles with anyone. Ask for help if you need support or instructions about stopping the use of  drugs.  High blood pressure causes heart disease and increases the risk of stroke. Your blood pressure should be checked at least every 1-2 years. Ongoing high blood pressure should be treated with medicines, if weight loss and exercise are not effective.  If you are 28-64 years old, ask your health care provider if you should take aspirin to prevent heart disease.  Diabetes screening involves taking a blood sample to check your fasting blood sugar level. This should be done once every 3 years, after age 13, if you are within normal weight and without risk factors for diabetes. Testing should be considered at a younger age or be carried out more frequently if you are overweight and have at least 1 risk factor for diabetes.  Colorectal cancer can be detected and often prevented. Most routine colorectal cancer screening begins at the age of 78 and continues through age 56. However, your health care provider may recommend screening at an earlier age if you have risk  factors for colon cancer. On a yearly basis, your health care provider may provide home test kits to check for hidden blood in the stool. Use of a small camera at the end of a tube to directly examine the colon (sigmoidoscopy or colonoscopy) can detect the earliest forms of colorectal cancer. Talk to your health care provider about this at age 43, when routine screening begins. Direct exam of the colon should be repeated every 5-10 years through age 53, unless early forms of precancerous polyps or small growths are found.   Talk with your health care provider about prostate cancer screening.  Testicular cancer screening isrecommended for adult males. Screening includes self-exam, a health care provider exam, and other screening tests. Consult with your health care provider about any symptoms you have or any concerns you have about testicular cancer.  Use sunscreen. Apply sunscreen liberally and repeatedly throughout the day. You should seek  shade when your shadow is shorter than you. Protect yourself by wearing long sleeves, pants, a wide-brimmed hat, and sunglasses year round, whenever you are outdoors.  Once a month, do a whole-body skin exam, using a mirror to look at the skin on your back. Tell your health care provider about new moles, moles that have irregular borders, moles that are larger than a pencil eraser, or moles that have changed in shape or color.  Stay current with required vaccines (immunizations).  Influenza vaccine. All adults should be immunized every year.  Tetanus, diphtheria, and acellular pertussis (Td, Tdap) vaccine. An adult who has not previously received Tdap or who does not know his vaccine status should receive 1 dose of Tdap. This initial dose should be followed by tetanus and diphtheria toxoids (Td) booster doses every 10 years. Adults with an unknown or incomplete history of completing a 3-dose immunization series with Td-containing vaccines should begin or complete a primary immunization series including a Tdap dose. Adults should receive a Td booster every 10 years.  Varicella vaccine. An adult without evidence of immunity to varicella should receive 2 doses or a second dose if he has previously received 1 dose.  Human papillomavirus (HPV) vaccine. Males aged 77-21 years who have not received the vaccine previously should receive the 3-dose series. Males aged 22-26 years may be immunized. Immunization is recommended through the age of 17 years for any male who has sex with males and did not get any or all doses earlier. Immunization is recommended for any person with an immunocompromised condition through the age of 87 years if he did not get any or all doses earlier. During the 3-dose series, the second dose should be obtained 4-8 weeks after the first dose. The third dose should be obtained 24 weeks after the first dose and 16 weeks after the second dose.  Zoster vaccine. One dose is recommended for  adults aged 27 years or older unless certain conditions are present.    PREVNAR  - Pneumococcal 13-valent conjugate (PCV13) vaccine. When indicated, a person who is uncertain of his immunization history and has no record of immunization should receive the PCV13 vaccine. An adult aged 74 years or older who has certain medical conditions and has not been previously immunized should receive 1 dose of PCV13 vaccine. This PCV13 should be followed with a dose of pneumococcal polysaccharide (PPSV23) vaccine. The PPSV23 vaccine dose should be obtained at least 8 weeks after the dose of PCV13 vaccine. An adult aged 75 years or older who has certain medical conditions and previously received  1 or more doses of PPSV23 vaccine should receive 1 dose of PCV13. The PCV13 vaccine dose should be obtained 1 or more years after the last PPSV23 vaccine dose.    PNEUMOVAX - Pneumococcal polysaccharide (PPSV23) vaccine. When PCV13 is also indicated, PCV13 should be obtained first. All adults aged 52 years and older should be immunized. An adult younger than age 70 years who has certain medical conditions should be immunized. Any person who resides in a nursing home or long-term care facility should be immunized. An adult smoker should be immunized. People with an immunocompromised condition and certain other conditions should receive both PCV13 and PPSV23 vaccines. People with human immunodeficiency virus (HIV) infection should be immunized as soon as possible after diagnosis. Immunization during chemotherapy or radiation therapy should be avoided. Routine use of PPSV23 vaccine is not recommended for American Indians, [place] Natives, or people younger than 65 years unless there are medical conditions that require PPSV23 vaccine. When indicated, people who have unknown immunization and have no record of immunization should receive PPSV23 vaccine. One-time revaccination 5 years after the first dose of PPSV23 is recommended for  people aged 19-64 years who have chronic kidney failure, nephrotic syndrome, asplenia, or immunocompromised conditions. People who received 1-2 doses of PPSV23 before age 33 years should receive another dose of PPSV23 vaccine at age 65 years or later if at least 5 years have passed since the previous dose. Doses of PPSV23 are not needed for people immunized with PPSV23 at or after age 65 years.    Hepatitis A vaccine. Adults who wish to be protected from this disease, have certain high-risk conditions, work with hepatitis A-infected animals, work in hepatitis A research labs, or travel to or work in countries with a high rate of hepatitis A should be immunized. Adults who were previously unvaccinated and who anticipate close contact with an international adoptee during the first 60 days after arrival in the [place] from a country with a high rate of hepatitis A should be immunized.    Hepatitis B vaccine. Adults should be immunized if they wish to be protected from this disease, have certain high-risk conditions, may be exposed to blood or other infectious body fluids, are household contacts or sex partners of hepatitis B positive people, are clients or workers in certain care facilities, or travel to or work in countries with a high rate of hepatitis B.   Preventive Service / Frequency   Ages 27 to 46  Blood pressure check.  Lipid and cholesterol check.  Lung cancer screening. / Every year if you are aged 83-80 years and have a 30-pack-year history of smoking and currently smoke or have quit within the past 15 years. Yearly screening is stopped once you have quit smoking for at least 15 years or develop a health problem that would prevent you from having lung cancer treatment.  Fecal occult blood test (FOBT) of stool. / Every year beginning at age 82 and continuing until age 21. You may not have to do this test if you get a colonoscopy every 10 years.  Flexible sigmoidoscopy** or  colonoscopy.** / Every 5 years for a flexible sigmoidoscopy or every 10 years for a colonoscopy beginning at age 47 and continuing until age 49.  Hepatitis C blood test.** / For all people born from 1 through 1964 and any individual with known risks for hepatitis C.  Skin self-exam. / Monthly.  Influenza vaccine. / Every year.  Tetanus, diphtheria, and acellular pertussis (Tdap/Td)  vaccine.** / Consult your health care provider. 1 dose of Td every 10 years.  Zoster vaccine.** / 1 dose for adults aged 41 years or older.  Pneumococcal 13-valent conjugate (PCV13) vaccine.** / Consult your health care provider.  Pneumococcal polysaccharide (PPSV23) vaccine.** / 1 to 2 doses if you smoke cigarettes or if you have certain conditions.  Hepatitis A vaccine.** / Consult your health care provider.  Hepatitis B vaccine.** / Consult your health care provider.

## 2014-06-21 LAB — HEPATIC FUNCTION PANEL
ALT: 40 U/L (ref 0–53)
AST: 26 U/L (ref 0–37)
Albumin: 4.7 g/dL (ref 3.5–5.2)
Alkaline Phosphatase: 74 U/L (ref 39–117)
Bilirubin, Direct: 0.1 mg/dL (ref 0.0–0.3)
Indirect Bilirubin: 0.3 mg/dL (ref 0.2–1.2)
Total Bilirubin: 0.4 mg/dL (ref 0.2–1.2)
Total Protein: 7.4 g/dL (ref 6.0–8.3)

## 2014-06-21 LAB — BASIC METABOLIC PANEL WITH GFR
BUN: 22 mg/dL (ref 6–23)
CO2: 25 mEq/L (ref 19–32)
Calcium: 9.6 mg/dL (ref 8.4–10.5)
Chloride: 98 mEq/L (ref 96–112)
Creat: 0.91 mg/dL (ref 0.50–1.35)
GFR, Est African American: >89 mL/min
GFR, Est Non African American: >89 mL/min
Glucose, Bld: 115 mg/dL — ABNORMAL HIGH (ref 70–99)
Potassium: 4.9 mEq/L (ref 3.5–5.3)
Sodium: 134 mEq/L — ABNORMAL LOW (ref 135–145)

## 2014-06-21 LAB — LIPID PANEL
Cholesterol: 152 mg/dL (ref 0–200)
HDL: 29 mg/dL — ABNORMAL LOW (ref >39)
LDL Cholesterol: 82 mg/dL (ref 0–99)
Total CHOL/HDL Ratio: 5.2 Ratio
Triglycerides: 207 mg/dL — ABNORMAL HIGH (ref <150)
VLDL: 41 mg/dL — ABNORMAL HIGH (ref 0–40)

## 2014-06-21 LAB — URINALYSIS, MICROSCOPIC ONLY
Bacteria, UA: NONE SEEN
Casts: NONE SEEN
Crystals: NONE SEEN
Squamous Epithelial / LPF: NONE SEEN

## 2014-06-21 LAB — MICROALBUMIN / CREATININE URINE RATIO
Creatinine, Urine: 60.7 mg/dL
Microalb Creat Ratio: 4.9 mg/g (ref 0.0–30.0)
Microalb, Ur: 0.3 mg/dL (ref <2.0)

## 2014-06-21 LAB — TESTOSTERONE: Testosterone: 442 ng/dL (ref 300–890)

## 2014-06-21 LAB — PSA: PSA: 0.58 ng/mL (ref <=4.00)

## 2014-06-21 LAB — MAGNESIUM: Magnesium: 1.8 mg/dL (ref 1.5–2.5)

## 2014-06-21 LAB — VITAMIN D 25 HYDROXY (VIT D DEFICIENCY, FRACTURES): Vit D, 25-Hydroxy: 86 ng/mL (ref 30–89)

## 2014-06-21 LAB — TSH: TSH: 2.175 u[IU]/mL (ref 0.350–4.500)

## 2014-06-21 LAB — VITAMIN B12: Vitamin B-12: 391 pg/mL (ref 211–911)

## 2014-06-21 LAB — INSULIN, FASTING: Insulin fasting, serum: 31.2 u[IU]/mL — ABNORMAL HIGH (ref 2.0–19.6)

## 2014-06-22 ENCOUNTER — Telehealth: Payer: Self-pay | Admitting: *Deleted

## 2014-06-22 NOTE — Telephone Encounter (Signed)
Faxed form to insurance company [phone]) at patient request.

## 2014-06-22 NOTE — Telephone Encounter (Signed)
Form faxed to insurance company-[phone].

## 2014-06-23 LAB — TB SKIN TEST
Induration: 0 mm
TB Skin Test: NEGATIVE

## 2014-07-03 ENCOUNTER — Encounter: Payer: Self-pay | Admitting: Physician Assistant

## 2014-07-03 ENCOUNTER — Ambulatory Visit (INDEPENDENT_AMBULATORY_CARE_PROVIDER_SITE_OTHER): Payer: BC Managed Care – PPO | Admitting: Physician Assistant

## 2014-07-03 ENCOUNTER — Ambulatory Visit (HOSPITAL_COMMUNITY)
Admission: RE | Admit: 2014-07-03 | Discharge: 2014-07-03 | Disposition: A | Discharge status: Home / Self Care | Payer: BC Managed Care – PPO | Source: Ambulatory Visit | Attending: Physician Assistant | Admitting: Physician Assistant

## 2014-07-03 VITALS — BP 138/70 | HR 112 | Temp 100.2°F | Resp 18 | Ht 64.5 in | Wt 168.0 lb

## 2014-07-03 DIAGNOSIS — R5383 Other fatigue: Secondary | ICD-10-CM | POA: Diagnosis not present

## 2014-07-03 DIAGNOSIS — Z87891 Personal history of nicotine dependence: Secondary | ICD-10-CM | POA: Diagnosis not present

## 2014-07-03 DIAGNOSIS — R05 Cough: Secondary | ICD-10-CM

## 2014-07-03 DIAGNOSIS — R938 Abnormal findings on diagnostic imaging of other specified body structures: Secondary | ICD-10-CM

## 2014-07-03 DIAGNOSIS — R0789 Other chest pain: Secondary | ICD-10-CM | POA: Diagnosis not present

## 2014-07-03 DIAGNOSIS — R059 Cough, unspecified: Secondary | ICD-10-CM

## 2014-07-03 DIAGNOSIS — J209 Acute bronchitis, unspecified: Secondary | ICD-10-CM

## 2014-07-03 DIAGNOSIS — R9389 Abnormal findings on diagnostic imaging of other specified body structures: Secondary | ICD-10-CM

## 2014-07-03 DIAGNOSIS — J449 Chronic obstructive pulmonary disease, unspecified: Secondary | ICD-10-CM | POA: Insufficient documentation

## 2014-07-03 MED ORDER — PROMETHAZINE-CODEINE 6.25-10 MG/5ML PO SYRP
5.0000 mL | ORAL_SOLUTION | Freq: Four times a day (QID) | ORAL | Status: DC | PRN
Start: 2014-07-03 — End: 2014-07-03

## 2014-07-03 MED ORDER — IPRATROPIUM-ALBUTEROL 0.5-2.5 (3) MG/3ML IN SOLN
3.0000 mL | Freq: Once | RESPIRATORY_TRACT | Status: AC
  Administered 2014-07-03: 3 mL via RESPIRATORY_TRACT

## 2014-07-03 MED ORDER — PREDNISONE 20 MG PO TABS: ORAL_TABLET | ORAL | Status: DC

## 2014-07-03 MED ORDER — PROMETHAZINE-CODEINE 6.25-10 MG/5ML PO SYRP: 5.0000 mL | ORAL_SOLUTION | Freq: Four times a day (QID) | ORAL | Status: AC | PRN

## 2014-07-03 MED ORDER — ALBUTEROL SULFATE HFA 108 (90 BASE) MCG/ACT IN AERS: INHALATION_SPRAY | RESPIRATORY_TRACT | Status: DC

## 2014-07-03 MED ORDER — AZITHROMYCIN 250 MG PO TABS: ORAL_TABLET | ORAL | Status: DC

## 2014-07-03 NOTE — Patient Instructions (Addendum)
-Continue Albuterol inhaler as prescribed. -Take Tylenol/Acetaminiphen 325mg  orally every 6 hours for pain/fever.  Max: 4 per day -Take Z-Pak as prescribed. Take Promethazine with Codeine as prescribed for cough -Take prednisone as prescribed.  Take with food.  Be careful of what you eat due to prednisone can cause weight gain.   -Drink plenty of fluids to stay hydrated and to help thin out mucous.    If you are not feeling better in 7-10 days, then please call the office.  If you have worsening symptoms like chest pain, SOB, dizziness or syncope, then please go to the ER immediately.    Acute Bronchitis Bronchitis is inflammation of the airways that extend from the windpipe into the lungs (bronchi). The inflammation often causes mucus to develop. This leads to a cough, which is the most common symptom of bronchitis.  In acute bronchitis, the condition usually develops suddenly and goes away over time, usually in a couple weeks. Smoking, allergies, and asthma can make bronchitis worse. Repeated episodes of bronchitis may cause further lung problems.  CAUSES Acute bronchitis is most often caused by the same virus that causes a cold. The virus can spread from person to person (contagious) through coughing, sneezing, and touching contaminated objects. SIGNS AND SYMPTOMS   Cough.   Fever.   Coughing up mucus.   Body aches.   Chest congestion.   Chills.   Shortness of breath.   Sore throat.  DIAGNOSIS  Acute bronchitis is usually diagnosed through a physical exam. Your health care provider will also ask you questions about your medical history. Tests, such as chest X-rays, are sometimes done to rule out other conditions.  TREATMENT  Acute bronchitis usually goes away in a couple weeks. Oftentimes, no medical treatment is necessary. Medicines are sometimes given for relief of fever or cough. Antibiotic medicines are usually not needed but may be prescribed in certain situations.  In some cases, an inhaler may be recommended to help reduce shortness of breath and control the cough. A cool mist vaporizer may also be used to help thin bronchial secretions and make it easier to clear the chest.  HOME CARE INSTRUCTIONS  Get plenty of rest.   Drink enough fluids to keep your urine clear or pale yellow (unless you have a medical condition that requires fluid restriction). Increasing fluids may help thin your respiratory secretions (sputum) and reduce chest congestion, and it will prevent dehydration.   Take medicines only as directed by your health care provider.  If you were prescribed an antibiotic medicine, finish it all even if you start to feel better.  Avoid smoking and secondhand smoke. Exposure to cigarette smoke or irritating chemicals will make bronchitis worse. If you are a smoker, consider using nicotine gum or skin patches to help control withdrawal symptoms. Quitting smoking will help your lungs heal faster.   Reduce the chances of another bout of acute bronchitis by washing your hands frequently, avoiding people with cold symptoms, and trying not to touch your hands to your mouth, nose, or eyes.   Keep all follow-up visits as directed by your health care provider.  SEEK MEDICAL CARE IF: Your symptoms do not improve after 1 week of treatment.  SEEK IMMEDIATE MEDICAL CARE IF:  You develop an increased fever or chills.   You have chest pain.   You have severe shortness of breath.  You have bloody sputum.   You develop dehydration.  You faint or repeatedly feel like you are going to pass  out.  You develop repeated vomiting.  You develop a severe headache. MAKE SURE YOU:   Understand these instructions.  Will watch your condition.  Will get help right away if you are not doing well or get worse. Document Released: 09/25/2004 Document Revised: 01/02/2014 Document Reviewed: 02/08/2013 [facility] Patient Information 2015 [place], [place]. This  information is not intended to replace advice given to you by your health care provider. Make sure you discuss any questions you have with your health care provider.

## 2014-07-03 NOTE — Progress Notes (Signed)
Subjective:    Patient ID: Douglas Barrera, male    DOB: 27-Feb-1951, 63 y.o.   MRN: [id number]  Sore Throat  This is a new problem. Episode onset: 2 days ago. The problem has been unchanged. Maximum temperature: No fever at home. The pain is at a severity of 7/10. The pain is moderate. Associated symptoms include congestion, coughing, shortness of breath and swollen glands. Pertinent negatives include no abdominal pain, diarrhea, ear discharge, ear pain, headaches, hoarse voice, plugged ear sensation, neck pain, stridor, trouble swallowing or vomiting. Associated symptoms comments: Fatigue, chills, night sweats.  Sinus pressure on and off.  Has chest tightness/congestion, cough, dyspnea and COPD.  Patient states his glands feel swollen.. Exposure to: Wife was sick at home. Treatments tried: Promethazine with codiene and aleve. The treatment provided no relief.  Cough Associated symptoms include a fever, a sore throat, shortness of breath and wheezing. Pertinent negatives include no chest pain, chills, ear congestion, ear pain, headaches, heartburn, hemoptysis, myalgias, nasal congestion, postnasal drip, rash, rhinorrhea, sweats or weight loss. Risk factors for lung disease include smoking/tobacco exposure (Has COPD and smokes 1/2 pack a day currently.). The treatment provided no relief. His past medical history is significant for COPD.   Review of Systems  Constitutional: Positive for fever and fatigue. Negative for chills, weight loss, diaphoresis and appetite change.  HENT: Positive for congestion and sore throat. Negative for ear discharge, ear pain, hoarse voice, postnasal drip, rhinorrhea, sinus pressure, tinnitus and trouble swallowing.   Eyes: Negative.   Respiratory: Positive for cough, chest tightness, shortness of breath and wheezing. Negative for hemoptysis and stridor.   Cardiovascular: Negative.  Negative for chest pain.  Gastrointestinal: Negative.  Negative for heartburn, nausea,  vomiting, abdominal pain, diarrhea and constipation.  Genitourinary: Negative.   Musculoskeletal: Negative.  Negative for myalgias and neck pain.  Skin: Negative.  Negative for rash.  Neurological: Negative.  Negative for headaches.  Psychiatric/Behavioral: Negative.    Past Medical History  Diagnosis Date  . COPD (chronic obstructive pulmonary disease)   . Labile hypertension   . History of elevated lipids   . GERD (gastroesophageal reflux disease)   . Prediabetes   . IBS (irritable bowel syndrome)   . Vitamin D deficiency    Current Outpatient Prescriptions on File Prior to Visit  Medication Sig Dispense Refill  . aspirin 81 MG tablet Take 81 mg by mouth daily.    . Cholecalciferol (VITAMIN D3) 10000 UNITS capsule Take 10,000 Units by mouth daily.    . diphenhydrAMINE (BENADRYL) 25 MG tablet Take 25 mg by mouth. Take 2 tablets bid    . fluticasone (FLONASE) 50 MCG/ACT nasal spray USE 2 SPRAYS IN EACH       NOSTRIL DAILY 48 g 3  . formoterol (PERFOROMIST) 20 MCG/2ML nebulizer solution 60ml vial solution BID in nebulizer 360 mL 6  . guaifenesin (HUMIBID E) 400 MG TABS tablet Take 400 mg by mouth 2 (two) times daily.     No current facility-administered medications on file prior to visit.   Allergies  Allergen Reactions  . Niacin And Related Other (See Comments)     BP 138/70 mmHg  Pulse 112  Temp(Src) 100.2 F (37.9 C) (Oral)  Resp 18  Ht 5' 4.5" (1.638 m)  Wt 168 lb (76.204 kg)  BMI 28.40 kg/m2  SpO2 90% Objective:   Physical Exam  Constitutional: He is oriented to person, place, and time. He appears well-developed and well-nourished. He has a sickly appearance.  No distress.  HENT:  Head: Normocephalic and atraumatic.  Right Ear: Tympanic membrane, external ear and ear canal normal. No drainage or swelling. Tympanic membrane is not injected, not scarred, not perforated, not erythematous, not retracted and not bulging. No middle ear effusion.  Left Ear: Tympanic  membrane, external ear and ear canal normal. No drainage or swelling. Tympanic membrane is not injected, not scarred, not perforated, not erythematous, not retracted and not bulging.  No middle ear effusion.  Nose: Nose normal. No mucosal edema, rhinorrhea or sinus tenderness. Right sinus exhibits no maxillary sinus tenderness and no frontal sinus tenderness. Left sinus exhibits no maxillary sinus tenderness and no frontal sinus tenderness.  Mouth/Throat: Uvula is midline, oropharynx is clear and moist and mucous membranes are normal. Mucous membranes are not pale and not dry. No trismus in the jaw. No uvula swelling. No oropharyngeal exudate, posterior oropharyngeal edema or tonsillar abscesses.  Mild posterior oropharyngeal erythema.  Eyes: Conjunctivae and lids are normal. Pupils are equal, round, and reactive to light. Right eye exhibits no discharge. Left eye exhibits no discharge. No scleral icterus.  Neck: Trachea normal and normal range of motion. No tracheal tenderness present. No tracheal deviation present.  Cardiovascular: Normal rate, regular rhythm, S1 normal, S2 normal, normal heart sounds, intact distal pulses and normal pulses.  Exam reveals no gallop, no distant heart sounds and no friction rub.   No murmur heard. Pulmonary/Chest: Effort normal. No accessory muscle usage or stridor. No tachypnea and no bradypnea. No respiratory distress. He has no decreased breath sounds. He has wheezes in the right upper field, the right lower field, the left upper field and the left lower field. He has no rhonchi. He has no rales. He exhibits no tenderness.  Abdominal: Soft. Bowel sounds are normal. He exhibits no distension and no mass. There is no hepatosplenomegaly. There is no tenderness. There is no rebound and no guarding. No hernia.  Musculoskeletal: Normal range of motion.  Lymphadenopathy:       Head (right side): No submental, no submandibular, no tonsillar, no preauricular, no posterior  auricular and no occipital adenopathy present.       Head (left side): No submental, no submandibular, no tonsillar, no preauricular, no posterior auricular and no occipital adenopathy present.    He has no cervical adenopathy.       Right: No supraclavicular adenopathy present.       Left: No supraclavicular adenopathy present.  Neurological: He is alert and oriented to person, place, and time. He has normal strength.  Skin: Skin is warm, dry and intact. No rash noted. He is not diaphoretic. No cyanosis. Nails show no clubbing.  Psychiatric: He has a normal mood and affect. His speech is normal and behavior is normal. Judgment and thought content normal. Cognition and memory are normal.  Vitals reviewed.     Assessment & Plan:  1. Acute bronchitis, unspecified organism -Take the Z-Pak as prescribed- azithromycin (ZITHROMAX Z-PAK) 250 MG tablet; Take 2 tablets PO on day 1, then take 1 tablet PO QDaily for 4 days.  Dispense: 6 tablet; Refill: 0 -Take the prednisone as prescribed- predniSONE (DELTASONE) 20 MG tablet; Take 3 tablets PO QDaily for 3 days, then take 2 tablets PO QDaily for 3 days, then take 1 tablet PO QDaily for 3 days  Dispense: 18 tablet; Refill: 0 -Take the albuterol as prescribed to help open your airway- albuterol (PROVENTIL HFA;VENTOLIN HFA) 108 (90 BASE) MCG/ACT inhaler; Inhale  1 to 2 puffs 4  x day or every 4 hours to rescue asthma  Dispense: 18 g; Refill: 99 -Gave DuoNeb in office due to 90% oxygen saturation.  Patient states his SOB felt better when he left the office.- ipratropium-albuterol (DUONEB) 0.5-2.5 (3) MG/3ML nebulizer solution 3 mL; Take 3 mLs by nebulization once. - DG Chest 2 View; Future- ordered to check for pneumonia.    2. Cough -Take the Promethazine with Codeine as prescribed for cough.  Do NOT take your wife's prescription- promethazine-codeine (PHENERGAN WITH CODEINE) 6.25-10 MG/5ML syrup; Take 5 mLs by mouth every 6 (six) hours as needed for cough.  Max: 29mL per day  Dispense: 240 mL; Refill: 0  3. Abnormal chest x-ray -Patient's chest x-ray came back negative for pneumonia.  No changes in COPD since last chest x-ray. -Ordered Dexa Bone Scan due to bony thorax is demineralized but intact - DG Bone Density; Future- R/O osteopenia and osteoporosis.  If you are not feeling better in 7-10 days, then please call the office. If you are having chest pain, SOB, difficulty breathing or coughing up blood, then please go to ER immediately.  Gracieann Stannard, Stephani Police, PA-C 5:29 PM [facility]

## 2014-07-04 NOTE — Progress Notes (Signed)
Patient scheduled at [facility] 07-10-14 at 8:45. No calcium supplements 24 hours prior. Patient aware of appointment and instructions

## 2014-07-05 ENCOUNTER — Ambulatory Visit (HOSPITAL_COMMUNITY): Payer: BC Managed Care – PPO

## 2014-07-10 ENCOUNTER — Ambulatory Visit (HOSPITAL_COMMUNITY)
Admission: RE | Admit: 2014-07-10 | Discharge: 2014-07-10 | Disposition: A | Discharge status: Home / Self Care | Payer: BC Managed Care – PPO | Source: Ambulatory Visit | Attending: Physician Assistant | Admitting: Physician Assistant

## 2014-07-10 DIAGNOSIS — R9389 Abnormal findings on diagnostic imaging of other specified body structures: Secondary | ICD-10-CM

## 2014-07-10 DIAGNOSIS — Z13820 Encounter for screening for osteoporosis: Secondary | ICD-10-CM | POA: Insufficient documentation

## 2014-07-12 ENCOUNTER — Encounter: Payer: Self-pay | Admitting: Physician Assistant

## 2014-07-12 DIAGNOSIS — J209 Acute bronchitis, unspecified: Secondary | ICD-10-CM

## 2014-07-12 MED ORDER — PREDNISONE 20 MG PO TABS: ORAL_TABLET | ORAL | Status: DC

## 2014-07-12 MED ORDER — LEVOFLOXACIN 500 MG PO TABS: 500.0000 mg | ORAL_TABLET | Freq: Every day | ORAL | Status: DC

## 2014-07-19 ENCOUNTER — Other Ambulatory Visit (INDEPENDENT_AMBULATORY_CARE_PROVIDER_SITE_OTHER): Payer: BC Managed Care – PPO | Admitting: *Deleted

## 2014-07-19 DIAGNOSIS — Z1211 Encounter for screening for malignant neoplasm of colon: Secondary | ICD-10-CM

## 2014-07-19 LAB — POC HEMOCCULT BLD/STL (HOME/3-CARD/SCREEN)
Card #2 Fecal Occult Blod, POC: NEGATIVE
Card #3 Fecal Occult Blood, POC: NEGATIVE
Fecal Occult Blood, POC: NEGATIVE

## 2014-11-21 ENCOUNTER — Other Ambulatory Visit: Payer: Self-pay | Admitting: Emergency Medicine

## 2015-06-28 ENCOUNTER — Ambulatory Visit (INDEPENDENT_AMBULATORY_CARE_PROVIDER_SITE_OTHER): Payer: BLUE CROSS/BLUE SHIELD | Admitting: Internal Medicine

## 2015-06-28 ENCOUNTER — Encounter: Payer: Self-pay | Admitting: Internal Medicine

## 2015-06-28 VITALS — BP 124/78 | HR 84 | Temp 97.6°F | Resp 16 | Ht 64.75 in | Wt 164.6 lb

## 2015-06-28 DIAGNOSIS — I10 Essential (primary) hypertension: Secondary | ICD-10-CM

## 2015-06-28 DIAGNOSIS — E559 Vitamin D deficiency, unspecified: Secondary | ICD-10-CM | POA: Diagnosis not present

## 2015-06-28 DIAGNOSIS — K219 Gastro-esophageal reflux disease without esophagitis: Secondary | ICD-10-CM | POA: Diagnosis not present

## 2015-06-28 DIAGNOSIS — E782 Mixed hyperlipidemia: Secondary | ICD-10-CM

## 2015-06-28 DIAGNOSIS — Z111 Encounter for screening for respiratory tuberculosis: Secondary | ICD-10-CM | POA: Diagnosis not present

## 2015-06-28 DIAGNOSIS — Z23 Encounter for immunization: Secondary | ICD-10-CM | POA: Diagnosis not present

## 2015-06-28 DIAGNOSIS — Z6827 Body mass index (BMI) 27.0-27.9, adult: Secondary | ICD-10-CM

## 2015-06-28 DIAGNOSIS — Z79899 Other long term (current) drug therapy: Secondary | ICD-10-CM | POA: Diagnosis not present

## 2015-06-28 DIAGNOSIS — E663 Overweight: Secondary | ICD-10-CM | POA: Insufficient documentation

## 2015-06-28 DIAGNOSIS — Z1212 Encounter for screening for malignant neoplasm of rectum: Secondary | ICD-10-CM

## 2015-06-28 DIAGNOSIS — Z0001 Encounter for general adult medical examination with abnormal findings: Secondary | ICD-10-CM

## 2015-06-28 DIAGNOSIS — R6889 Other general symptoms and signs: Secondary | ICD-10-CM | POA: Diagnosis not present

## 2015-06-28 DIAGNOSIS — F17209 Nicotine dependence, unspecified, with unspecified nicotine-induced disorders: Secondary | ICD-10-CM

## 2015-06-28 DIAGNOSIS — F17200 Nicotine dependence, unspecified, uncomplicated: Secondary | ICD-10-CM

## 2015-06-28 DIAGNOSIS — R5383 Other fatigue: Secondary | ICD-10-CM

## 2015-06-28 DIAGNOSIS — J42 Unspecified chronic bronchitis: Secondary | ICD-10-CM

## 2015-06-28 DIAGNOSIS — R7303 Prediabetes: Secondary | ICD-10-CM

## 2015-06-28 DIAGNOSIS — R0989 Other specified symptoms and signs involving the circulatory and respiratory systems: Secondary | ICD-10-CM

## 2015-06-28 DIAGNOSIS — Z125 Encounter for screening for malignant neoplasm of prostate: Secondary | ICD-10-CM

## 2015-06-28 DIAGNOSIS — Z6825 Body mass index (BMI) 25.0-25.9, adult: Secondary | ICD-10-CM | POA: Insufficient documentation

## 2015-06-28 DIAGNOSIS — Z716 Tobacco abuse counseling: Secondary | ICD-10-CM

## 2015-06-28 DIAGNOSIS — Z720 Tobacco use: Secondary | ICD-10-CM

## 2015-06-28 LAB — MAGNESIUM: Magnesium: 2 mg/dL (ref 1.5–2.5)

## 2015-06-28 LAB — LIPID PANEL
Cholesterol: 148 mg/dL (ref 125–200)
HDL: 28 mg/dL — ABNORMAL LOW (ref >=40)
LDL Cholesterol: 90 mg/dL (ref <130)
Total CHOL/HDL Ratio: 5.3 Ratio — ABNORMAL HIGH (ref <=5.0)
Triglycerides: 152 mg/dL — ABNORMAL HIGH (ref <150)
VLDL: 30 mg/dL (ref <30)

## 2015-06-28 LAB — CBC WITH DIFFERENTIAL/PLATELET
Basophils Absolute: 0 10*3/uL (ref 0.0–0.1)
Basophils Relative: 0 % (ref 0–1)
Eosinophils Absolute: 0.1 10*3/uL (ref 0.0–0.7)
Eosinophils Relative: 1 % (ref 0–5)
HCT: 52.3 % — ABNORMAL HIGH (ref 39.0–52.0)
Hemoglobin: 18.1 g/dL — ABNORMAL HIGH (ref 13.0–17.0)
Lymphocytes Relative: 26 % (ref 12–46)
Lymphs Abs: 1.8 10*3/uL (ref 0.7–4.0)
MCH: 32 pg (ref 26.0–34.0)
MCHC: 34.6 g/dL (ref 30.0–36.0)
MCV: 92.4 fL (ref 78.0–100.0)
MPV: 10.3 fL (ref 8.6–12.4)
Monocytes Absolute: 0.7 10*3/uL (ref 0.1–1.0)
Monocytes Relative: 10 % (ref 3–12)
Neutro Abs: 4.3 10*3/uL (ref 1.7–7.7)
Neutrophils Relative %: 63 % (ref 43–77)
Platelets: 221 10*3/uL (ref 150–400)
RBC: 5.66 MIL/uL (ref 4.22–5.81)
RDW: 12.7 % (ref 11.5–15.5)
WBC: 6.9 10*3/uL (ref 4.0–10.5)

## 2015-06-28 LAB — IRON AND TIBC
%SAT: 37 % (ref 15–60)
Iron: 130 ug/dL (ref 50–180)
TIBC: 355 ug/dL (ref 250–425)
UIBC: 225 ug/dL (ref 125–400)

## 2015-06-28 LAB — BASIC METABOLIC PANEL WITH GFR
BUN: 21 mg/dL (ref 7–25)
CO2: 27 mmol/L (ref 20–31)
Calcium: 9.6 mg/dL (ref 8.6–10.3)
Chloride: 99 mmol/L (ref 98–110)
Creat: 0.99 mg/dL (ref 0.70–1.25)
GFR, Est African American: >89 mL/min (ref >=60)
GFR, Est Non African American: 81 mL/min (ref >=60)
Glucose, Bld: 104 mg/dL — ABNORMAL HIGH (ref 65–99)
Potassium: 5.2 mmol/L (ref 3.5–5.3)
Sodium: 135 mmol/L (ref 135–146)

## 2015-06-28 LAB — HEPATIC FUNCTION PANEL
ALT: 26 U/L (ref 9–46)
AST: 20 U/L (ref 10–35)
Albumin: 4.4 g/dL (ref 3.6–5.1)
Alkaline Phosphatase: 76 U/L (ref 40–115)
Bilirubin, Direct: 0.1 mg/dL (ref <=0.2)
Indirect Bilirubin: 0.5 mg/dL (ref 0.2–1.2)
Total Bilirubin: 0.6 mg/dL (ref 0.2–1.2)
Total Protein: 7.4 g/dL (ref 6.1–8.1)

## 2015-06-28 LAB — TSH: TSH: 1.798 u[IU]/mL (ref 0.350–4.500)

## 2015-06-28 LAB — HEMOGLOBIN A1C
Hgb A1c MFr Bld: 6 % — ABNORMAL HIGH (ref <5.7)
Mean Plasma Glucose: 126 mg/dL — ABNORMAL HIGH (ref <117)

## 2015-06-28 LAB — VITAMIN B12: Vitamin B-12: 337 pg/mL (ref 211–911)

## 2015-06-28 NOTE — Progress Notes (Signed)
Patient ID: Douglas Barrera, male   DOB: [birth year], 64 y.o.   MRN: [id number]   Comprehensive Examination  This very nice 64 y.o. MWM presents for presents for a Wellness/Preventative Visit & comprehensive evaluation and management of multiple medical co-morbidities.  Patient has been followed for labile HTN, Prediabetes, Hyperlipidemia, COPD and Vitamin D Deficiency.He also has COPD using performist via Neb on a maintenance & has a rescue Albuterol MDI  which he uses occasionally  and unfortunately continues to smoke "3" cigars/week.   Patient has been followed expectantly for Labile HTN predating since 1997. Patient's BP has been controlled at home.Today's BP: 124/78 mmHg. Patient denies any cardiac symptoms as chest pain, palpitations, shortness of breath, dizziness or ankle swelling.He did have a Negative & Normal Cardiolite in 2003. 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    Patient's hyperlipidemia is controlled with diet and medications. Patient denies myalgias or other medication SE's. Last lipids were at Goal with Total Chol 152, HDL 29, slightly elevated Trig 2907, and an excellent LDL 82.    Patient has prediabetes since 2010 with A1c 6.0 and in 2014 it was 6.2%and patient denies reactive hypoglycemic symptoms, visual blurring, diabetic polys or paresthesias. Last A1c was 6.0% in Oct 2015.   Finally, patient has history of Vitamin D Deficiency of 24 in 2008  and last vitamin D was 15 in Oct 2015.      Medication Sig  . albuterol  HFA inhaler Inhale  1 to 2 puffs 4 x day or every 4 hours to rescue asthma  . aspirin 81 MG tablet Take 81 mg by mouth daily.  Marland Kitchen VITAMIN D 27062 UNITS capsule Take 10,000 Units by mouth daily.  . diphenhydrAMINE 25 MG tablet Take 25 mg by mouth. Take 2 tablets bid  . FLONASE nasal spray USE 2 SPRAYS IN EACH       NOSTRIL DAILY  . guaifenesin (HUMIBID E) 400 MG TABS  Take 400 mg by mouth 2 (two) times daily.  Marland Kitchen PERFOROMIST 20 MCG/2ML neb soln INHALE THE CONTENTS OF 1    VIAL VIA NEBULIZER TWO     TIMES A DAY   Allergies  Allergen Reactions  . Niacin And Related Other (See Comments)   Past Medical History  Diagnosis Date  . COPD (chronic obstructive pulmonary disease) ([place])   . Labile hypertension   . History of elevated lipids   . GERD (gastroesophageal reflux disease)   . Prediabetes   . IBS (irritable bowel syndrome)   . Vitamin D deficiency    Health Maintenance  Topic Date Due  . Hepatitis C Screening  10/24/1950  . HIV Screening  12/17/1966  . ZOSTAVAX  12/17/2011  . INFLUENZA VACCINE  04/02/2015  . COLONOSCOPY  09/27/2018  . TETANUS/TDAP  09/02/2019   Immunization History  Administered Date(s) Administered  . Influenza Split 06/20/2013, 06/20/2014  . PPD Test 06/20/2014  . Pneumococcal Conjugate-13 06/20/2014  . Pneumococcal Polysaccharide-23 04/30/2009  . Tdap 09/01/2009   Past Surgical History  Procedure Laterality Date  . Testicle surgery      testicle removed  . Basal cell carcinoma excision  2008    Left [place]ner   Family History  Problem Relation Age of Onset  . Breast cancer Mother   . Stomach cancer Mother    Social History   Social History  . Marital Status: Married    Spouse Name: N/A  . Number of Children: N/A  . Years of Education: N/A  Occupational History  . Not on file.   Social History Main Topics  . Smoking status: Current Every Day Smoker    Types: Cigars  . Smokeless tobacco: Never Used     Comment: quit smoking cigarettes, but smokes 3-4 cigars a week.  . Alcohol Use: No  . Drug Use: No  . Sexual Activity: Not on file   Other Topics Concern  . Not on file   Social History Narrative    ROS Constitutional: Denies fever, chills, weight loss/gain, headaches, insomnia,  night sweats or change in appetite. Does c/o fatigue. Eyes: Denies redness, blurred vision, diplopia, discharge, itchy or watery eyes.  ENT: Denies discharge, congestion, post nasal drip, epistaxis, sore throat, earache,  hearing loss, dental pain, Tinnitus, Vertigo, Sinus pain or snoring.  Cardio: Denies chest pain, palpitations, irregular heartbeat, syncope, dyspnea, diaphoresis, orthopnea, PND, claudication or edema Respiratory: denies cough, dyspnea, DOE, pleurisy, hoarseness, laryngitis or wheezing.  Gastrointestinal: Denies dysphagia, heartburn, reflux, water brash, pain, cramps, nausea, vomiting, bloating, diarrhea, constipation, hematemesis, melena, hematochezia, jaundice or hemorrhoids Genitourinary: Denies dysuria, frequency, urgency, nocturia, hesitancy, discharge, hematuria or flank pain Musculoskeletal: Denies arthralgia, myalgia, stiffness, Jt. Swelling, pain, limp or strain/sprain. Denies Falls. Skin: Denies puritis, rash, hives, warts, acne, eczema or change in skin lesion Neuro: No weakness, tremor, incoordination, spasms, paresthesia or pain Psychiatric: Denies confusion, memory loss or sensory loss. Denies Depression. Endocrine: Denies change in weight, skin, hair change, nocturia, and paresthesia, diabetic polys, visual blurring or hyper / hypo glycemic episodes.  Heme/Lymph: No excessive bleeding, bruising or enlarged lymph nodes.  Physical Exam  BP 124/78 mmHg  Pulse 84  Temp(Src) 97.6 F (36.4 C)  Resp 16  Ht 5' 4.75" (1.645 m)  Wt 164 lb 9.6 oz (74.662 kg)  BMI 27.59 kg/m2  General Appearance: Well nourished &  in no apparent distress. Eyes: PERRLA, EOMs, conjunctiva no swelling or erythema, normal fundi and vessels. Sinuses: No frontal/maxillary tenderness ENT/Mouth: EACs patent / TMs  nl. Nares clear without erythema, swelling, mucoid exudates. Oral hygiene is good. No erythema, swelling, or exudate. Tongue normal, non-obstructing. Tonsils not swollen or erythematous. Hearing normal.  Neck: Supple, thyroid normal. No bruits, nodes or JVD. Respiratory: Respiratory effort normal.  BS equal and clear bilateral with a few  Rales,but no  rhonci, wheezing or stridor. Cardio: Heart  sounds are normal with regular rate and rhythm and no murmurs, rubs or gallops. Peripheral pulses are normal and equal bilaterally without edema. No aortic or femoral bruits. Chest: symmetric with normal excursions and percussion.  Abdomen: Flat, soft, with bowel sounds. Nontender, no guarding, rebound, hernias, masses, or organomegaly.  Lymphatics: Non tender without lymphadenopathy.  Genitourinary: No hernias.Testes nl. DRE - prostate nl for age - smooth & firm w/o nodules. Musculoskeletal: Full ROM all peripheral extremities, joint stability, 5/5 strength, and normal gait. Skin: Warm and dry without rashes, lesions, cyanosis, clubbing or  ecchymosis.  Neuro: Cranial nerves intact, reflexes equal bilaterally. Normal muscle tone, no cerebellar symptoms. Sensation intact.  Pysch: Alert and oriented X 3 with normal affect, insight and judgment appropriate.   Assessment and Plan    1. Encounter for general adult medical examination with abnormal findings   2. Labile hypertension   3. Hyperlipidemia   4. Prediabetes   5. Vitamin D deficiency   6. Gastroesophageal reflux disease, esophagitis presence not specified   7. Chronic bronchitis, unspecified chronic bronchitis type ([place])   8. Screening for rectal cancer   9. Prostate cancer screening  10. BMI 27.59,  adult   11. Medication management  Continue prudent diet as discussed, weight control, BP monitoring, regular exercise, and medications as discussed.  Discussed med effects and SE's. Routine screening labs and tests as requested with regular follow-up as recommended.  Over 40 minutes of exam, counseling &  chart review was performed

## 2015-06-28 NOTE — Patient Instructions (Signed)
Recommend Adult Low Dose Aspirin or   coated  Aspirin 81 mg daily   To reduce risk of Colon Cancer 20 %,   Skin Cancer 26 % ,   Melanoma 46%   and   Pancreatic cancer 60%   ++++++++++++++++++++++++++++++++++++++++++++++++++++++  Vitamin D goal   is between 70-100.   Please make sure that you are taking your Vitamin D as directed.   It is very important as a natural anti-inflammatory   helping hair, skin, and nails, as well as reducing stroke and heart attack risk.   It helps your bones and helps with mood.  It also decreases numerous cancer risks so please take it as directed.   Low Vit D is associated with a 200-300% higher risk for CANCER   and 200-300% higher risk for HEART   ATTACK  &  STROKE.   ......................................  It is also associated with higher death rate at younger ages,   autoimmune diseases like Rheumatoid arthritis, Lupus, Multiple Sclerosis.     Also many other serious conditions, like depression, Alzheimer's  Dementia, infertility, muscle aches, fatigue, fibromyalgia - just to name a few.  ++++++++++++++++++++++++++++++++++++++++++++++++  Recommend the book "The END of DIETING" by Dr Joel Fuhrman   & the book "The END of DIABETES " by Dr Joel Fuhrman  At [employer] - get book & Audio CD's     Being diabetic has a  300% increased risk for heart attack, stroke, cancer, and alzheimer- type vascular dementia. It is very important that you work harder with diet by avoiding all foods that are white. Avoid white rice (brown & wild rice is OK), white potatoes (sweetpotatoes in moderation is OK), White bread or wheat bread or anything made out of white flour like bagels, donuts, rolls, buns, biscuits, cakes, pastries, cookies, pizza crust, and pasta (made from white flour & egg whites) - vegetarian pasta or spinach or wheat pasta is OK. Multigrain breads like Arnold's or [employer], or multigrain sandwich thins or flatbreads.  Diet,  exercise and weight loss can reverse and cure diabetes in the early stages.  Diet, exercise and weight loss is very important in the control and prevention of complications of diabetes which affects every system in your body, ie. Brain - dementia/stroke, eyes - glaucoma/blindness, heart - heart attack/heart failure, kidneys - dialysis, stomach - gastric paralysis, intestines - malabsorption, nerves - severe painful neuritis, circulation - gangrene & loss of a leg(s), and finally cancer and Alzheimers.    I recommend avoid fried & greasy foods,  sweets/candy, white rice (brown or wild rice or Quinoa is OK), white potatoes (sweet potatoes are OK) - anything made from white flour - bagels, doughnuts, rolls, buns, biscuits,white and wheat breads, pizza crust and traditional pasta made of white flour & egg white(vegetarian pasta or spinach or wheat pasta is OK).  Multi-grain bread is OK - like multi-grain flat bread or sandwich thins. Avoid alcohol in excess. Exercise is also important.    Eat all the vegetables you want - avoid meat, especially red meat and dairy - especially cheese.  Cheese is the most concentrated form of trans-fats which is the worst thing to clog up our arteries. Veggie cheese is OK which can be found in the fresh produce section at [employer] or [employer] or [employer]  ++++++++++++++++++++++++++++++++++++++++++++++++++ DASH Eating Plan  DASH stands for "Dietary Approaches to Stop Hypertension."   The DASH eating plan is a healthy eating plan that has been shown to reduce high   blood pressure (hypertension). Additional health benefits [place] include reducing the risk of type 2 diabetes mellitus, heart disease, and stroke. The DASH eating plan Poet also help with weight loss.  WHAT DO I NEED TO KNOW ABOUT THE DASH EATING PLAN? For the DASH eating plan, you will follow these general guidelines:  Choose foods with a percent daily value for sodium of less than 5% (as listed on the food  label).  Use salt-free seasonings or herbs instead of table salt or sea salt.  Check with your health care provider or pharmacist before using salt substitutes.  Eat lower-sodium products, often labeled as "lower sodium" or "no salt added."  Eat fresh foods.  Eat more vegetables, fruits, and low-fat dairy products.    Choose whole grains. Look for the word "whole" as the first word in the ingredient list.  Choose fish   Limit sweets, desserts, sugars, and sugary drinks.  Choose heart-healthy fats.  Eat veggie cheese   Eat more home-cooked food and less restaurant, buffet, and fast food.  Limit fried foods.  Cook foods using methods other than frying.  Limit canned vegetables. If you do use them, rinse them well to decrease the sodium.  When eating at a restaurant, ask that your food be prepared with less salt, or no salt if possible.                      WHAT FOODS CAN I EAT?  Seek help from a dietitian for individual calorie needs. Grains Whole grain or whole wheat bread. Brown rice. Whole grain or whole wheat pasta. Quinoa, bulgur, and whole grain cereals. Low-sodium cereals. Corn or whole wheat flour tortillas. Whole grain cornbread. Whole grain crackers. Low-sodium crackers.  Vegetables Fresh or frozen vegetables (raw, steamed, roasted, or grilled). Low-sodium or reduced-sodium tomato and vegetable juices. Low-sodium or reduced-sodium tomato sauce and paste. Low-sodium or reduced-sodium canned vegetables.   Fruits All fresh, canned (in natural juice), or frozen fruits.  Meat and Other Protein Products  All fish and seafood.  Dried beans, peas, or lentils. Unsalted nuts and seeds. Unsalted canned beans. Dairy Low-fat dairy products, such as skim or 1% milk, 2% or reduced-fat cheeses, low-fat ricotta or cottage cheese, or plain low-fat yogurt. Low-sodium or reduced-sodium cheeses.  Fats and Oils Tub margarines without trans fats. Light or reduced-fat mayonnaise  and salad dressings (reduced sodium). Avocado. Safflower, olive, or canola oils. Natural peanut or almond butter.  Other Unsalted popcorn and pretzels. The items listed above [place] not be a complete list of recommended foods or beverages. Contact your dietitian for more options.  +++++++++++++++++++++++++++++++++++++++++++  WHAT FOODS ARE NOT RECOMMENDED?  Grains/ White flour or wheat flour  White bread. White pasta. White rice. Refined cornbread. Bagels and croissants. Crackers that contain trans fat.  Vegetables  Creamed or fried vegetables. Vegetables in a . Regular canned vegetables. Regular canned tomato sauce and paste. Regular tomato and vegetable juices.  Fruits Dried fruits. Canned fruit in light or heavy syrup. Fruit juice.  Meat and Other Protein Products Meat in general. Fatty cuts of meat. Ribs, chicken wings, bacon, sausage, bologna, salami, chitterlings, fatback, hot dogs, bratwurst, and packaged luncheon meats. Salted nuts and seeds. Canned beans with salt.  Dairy Whole or 2% milk, cream, half-and-half, and cream cheese. Whole-fat or sweetened yogurt. Full-fat cheeses or blue cheese. Nondairy creamers and whipped toppings. Processed cheese, cheese spreads, or cheese curds.  Condiments Onion and garlic salt, seasoned salt, table salt, and sea  salt. Canned and packaged gravies. Worcestershire sauce. Tartar sauce. Barbecue sauce. Teriyaki sauce. Soy sauce, including reduced sodium. Steak sauce. Fish sauce. Oyster sauce. Cocktail sauce. Horseradish. Ketchup and mustard. Meat flavorings and tenderizers. Bouillon cubes. Hot sauce. Tabasco sauce. Marinades. Taco seasonings. Relishes.  Fats and Oils Butter, stick margarine, lard, shortening, ghee, and bacon fat. Coconut, palm kernel, or palm oils. Regular salad dressings.  Pickles and olives. Salted popcorn and pretzels. The items listed above may not be a complete list of foods and beverages to avoid.   Preventive Care for  Adults  A healthy lifestyle and preventive care can promote health and wellness. Preventive health guidelines for men include the following key practices:  A routine yearly physical is a good way to check with your health care provider about your health and preventative screening. It is a chance to share any concerns and updates on your health and to receive a thorough exam.  Visit your dentist for a routine exam and preventative care every 6 months. Brush your teeth twice a day and floss once a day. Good oral hygiene prevents tooth decay and gum disease.  The frequency of eye exams is based on your age, health, family medical history, use of contact lenses, and other factors. Follow your health care provider's recommendations for frequency of eye exams.  Eat a healthy diet. Foods such as vegetables, fruits, whole grains, low-fat dairy products, and lean protein foods contain the nutrients you need without too many calories. Decrease your intake of foods high in solid fats, added sugars, and salt. Eat the right amount of calories for you.Get information about a proper diet from your health care provider, if necessary.  Regular physical exercise is one of the most important things you can do for your health. Most adults should get at least 150 minutes of moderate-intensity exercise (any activity that increases your heart rate and causes you to sweat) each week. In addition, most adults need muscle-strengthening exercises on 2 or more days a week.  Maintain a healthy weight. The body mass index (BMI) is a screening tool to identify possible weight problems. It provides an estimate of body fat based on height and weight. Your health care provider can find your BMI and can help you achieve or maintain a healthy weight.For adults 20 years and older:  A BMI below 18.5 is considered underweight.  A BMI of 18.5 to 24.9 is normal.  A BMI of 25 to 29.9 is considered overweight.  A BMI of 30 and above  is considered obese.  Maintain normal blood lipids and cholesterol levels by exercising and minimizing your intake of saturated fat. Eat a balanced diet with plenty of fruit and vegetables. Blood tests for lipids and cholesterol should begin at age 20 and be repeated every 5 years. If your lipid or cholesterol levels are high, you are over 50, or you are at high risk for heart disease, you may need your cholesterol levels checked more frequently.Ongoing high lipid and cholesterol levels should be treated with medicines if diet and exercise are not working.  If you smoke, find out from your health care provider how to quit. If you do not use tobacco, do not start.  Lung cancer screening is recommended for adults aged 55-80 years who are at high risk for developing lung cancer because of a history of smoking. A yearly low-dose CT scan of the lungs is recommended for people who have at least a 30-pack-year history of smoking and are a   current smoker or have quit within the past 15 years. A pack year of smoking is smoking an average of 1 pack of cigarettes a day for 1 year (for example: 1 pack a day for 30 years or 2 packs a day for 15 years). Yearly screening should continue until the smoker has stopped smoking for at least 15 years. Yearly screening should be stopped for people who develop a health problem that would prevent them from having lung cancer treatment.  If you choose to drink alcohol, do not have more than 2 drinks per day. One drink is considered to be 12 ounces (355 mL) of beer, 5 ounces (148 mL) of wine, or 1.5 ounces (44 mL) of liquor.  Avoid use of street drugs. Do not share needles with anyone. Ask for help if you need support or instructions about stopping the use of drugs.  High blood pressure causes heart disease and increases the risk of stroke. Your blood pressure should be checked at least every 1-2 years. Ongoing high blood pressure should be treated with medicines, if weight loss  and exercise are not effective.  If you are 54-32 years old, ask your health care provider if you should take aspirin to prevent heart disease.  Diabetes screening involves taking a blood sample to check your fasting blood sugar level. This should be done once every 3 years, after age 60, if you are within normal weight and without risk factors for diabetes. Testing should be considered at a younger age or be carried out more frequently if you are overweight and have at least 1 risk factor for diabetes.  Colorectal cancer can be detected and often prevented. Most routine colorectal cancer screening begins at the age of 21 and continues through age 36. However, your health care provider may recommend screening at an earlier age if you have risk factors for colon cancer. On a yearly basis, your health care provider may provide home test kits to check for hidden blood in the stool. Use of a small camera at the end of a tube to directly examine the colon (sigmoidoscopy or colonoscopy) can detect the earliest forms of colorectal cancer. Talk to your health care provider about this at age 52, when routine screening begins. Direct exam of the colon should be repeated every 5-10 years through age 30, unless early forms of precancerous polyps or small growths are found.   Talk with your health care provider about prostate cancer screening.  Testicular cancer screening isrecommended for adult males. Screening includes self-exam, a health care provider exam, and other screening tests. Consult with your health care provider about any symptoms you have or any concerns you have about testicular cancer.  Use sunscreen. Apply sunscreen liberally and repeatedly throughout the day. You should seek shade when your shadow is shorter than you. Protect yourself by wearing long sleeves, pants, a wide-brimmed hat, and sunglasses year round, whenever you are outdoors.  Once a month, do a whole-body skin exam, using a mirror to  look at the skin on your back. Tell your health care provider about new moles, moles that have irregular borders, moles that are larger than a pencil eraser, or moles that have changed in shape or color.  Stay current with required vaccines (immunizations).  Influenza vaccine. All adults should be immunized every year.  Tetanus, diphtheria, and acellular pertussis (Td, Tdap) vaccine. An adult who has not previously received Tdap or who does not know his vaccine status should receive 1 dose of  Tdap. This initial dose should be followed by tetanus and diphtheria toxoids (Td) booster doses every 10 years. Adults with an unknown or incomplete history of completing a 3-dose immunization series with Td-containing vaccines should begin or complete a primary immunization series including a Tdap dose. Adults should receive a Td booster every 10 years.  Varicella vaccine. An adult without evidence of immunity to varicella should receive 2 doses or a second dose if he has previously received 1 dose.  Human papillomavirus (HPV) vaccine. Males aged 73-21 years who have not received the vaccine previously should receive the 3-dose series. Males aged 22-26 years may be immunized. Immunization is recommended through the age of 33 years for any male who has sex with males and did not get any or all doses earlier. Immunization is recommended for any person with an immunocompromised condition through the age of 61 years if he did not get any or all doses earlier. During the 3-dose series, the second dose should be obtained 4-8 weeks after the first dose. The third dose should be obtained 24 weeks after the first dose and 16 weeks after the second dose.  Zoster vaccine. One dose is recommended for adults aged 54 years or older unless certain conditions are present.    PREVNAR  - Pneumococcal 13-valent conjugate (PCV13) vaccine. When indicated, a person who is uncertain of his immunization history and has no record of  immunization should receive the PCV13 vaccine. An adult aged 25 years or older who has certain medical conditions and has not been previously immunized should receive 1 dose of PCV13 vaccine. This PCV13 should be followed with a dose of pneumococcal polysaccharide (PPSV23) vaccine. The PPSV23 vaccine dose should be obtained at least 8 weeks after the dose of PCV13 vaccine. An adult aged 55 years or older who has certain medical conditions and previously received 1 or more doses of PPSV23 vaccine should receive 1 dose of PCV13. The PCV13 vaccine dose should be obtained 1 or more years after the last PPSV23 vaccine dose.    PNEUMOVAX - Pneumococcal polysaccharide (PPSV23) vaccine. When PCV13 is also indicated, PCV13 should be obtained first. All adults aged 73 years and older should be immunized. An adult younger than age 96 years who has certain medical conditions should be immunized. Any person who resides in a nursing home or long-term care facility should be immunized. An adult smoker should be immunized. People with an immunocompromised condition and certain other conditions should receive both PCV13 and PPSV23 vaccines. People with human immunodeficiency virus (HIV) infection should be immunized as soon as possible after diagnosis. Immunization during chemotherapy or radiation therapy should be avoided. Routine use of PPSV23 vaccine is not recommended for American Indians, [place] Natives, or people younger than 65 years unless there are medical conditions that require PPSV23 vaccine. When indicated, people who have unknown immunization and have no record of immunization should receive PPSV23 vaccine. One-time revaccination 5 years after the first dose of PPSV23 is recommended for people aged 19-64 years who have chronic kidney failure, nephrotic syndrome, asplenia, or immunocompromised conditions. People who received 1-2 doses of PPSV23 before age 62 years should receive another dose of PPSV23 vaccine at age  40 years or later if at least 5 years have passed since the previous dose. Doses of PPSV23 are not needed for people immunized with PPSV23 at or after age 78 years.    Hepatitis A vaccine. Adults who wish to be protected from this disease, have certain high-risk conditions,  work with hepatitis A-infected animals, work in hepatitis A research labs, or travel to or work in countries with a high rate of hepatitis A should be immunized. Adults who were previously unvaccinated and who anticipate close contact with an international adoptee during the first 60 days after arrival in the [place] from a country with a high rate of hepatitis A should be immunized.    Hepatitis B vaccine. Adults should be immunized if they wish to be protected from this disease, have certain high-risk conditions, may be exposed to blood or other infectious body fluids, are household contacts or sex partners of hepatitis B positive people, are clients or workers in certain care facilities, or travel to or work in countries with a high rate of hepatitis B.   Preventive Service / Frequency   Ages 62 to 22  Blood pressure check.  Lipid and cholesterol check  Lung cancer screening. / Every year if you are aged 28-80 years and have a 30-pack-year history of smoking and currently smoke or have quit within the past 15 years. Yearly screening is stopped once you have quit smoking for at least 15 years or develop a health problem that would prevent you from having lung cancer treatment.  Fecal occult blood test (FOBT) of stool. / Every year beginning at age 106 and continuing until age 65. You may not have to do this test if you get a colonoscopy every 10 years.  Flexible sigmoidoscopy** or colonoscopy.** / Every 5 years for a flexible sigmoidoscopy or every 10 years for a colonoscopy beginning at age 19 and continuing until age 65. Screening for abdominal aortic aneurysm (AAA)  by ultrasound is recommended for people who  have history of high blood pressure or who are current or former smokers.

## 2015-06-29 LAB — URINALYSIS, ROUTINE W REFLEX MICROSCOPIC
Bilirubin Urine: NEGATIVE
Glucose, UA: NEGATIVE
Hgb urine dipstick: NEGATIVE
Ketones, ur: NEGATIVE
Leukocytes, UA: NEGATIVE
Nitrite: NEGATIVE
Protein, ur: NEGATIVE
Specific Gravity, Urine: 1.009 (ref 1.001–1.035)
pH: 6.5 (ref 5.0–8.0)

## 2015-06-29 LAB — MICROALBUMIN / CREATININE URINE RATIO
Creatinine, Urine: 28 mg/dL (ref 20–370)
Microalb, Ur: <0.2 mg/dL

## 2015-06-29 LAB — PSA: PSA: 0.72 ng/mL (ref <=4.00)

## 2015-06-29 LAB — VITAMIN D 25 HYDROXY (VIT D DEFICIENCY, FRACTURES): Vit D, 25-Hydroxy: 84 ng/mL (ref 30–100)

## 2015-06-29 LAB — INSULIN, RANDOM: Insulin: 34.4 u[IU]/mL — ABNORMAL HIGH (ref 2.0–19.6)

## 2015-06-29 LAB — TESTOSTERONE: Testosterone: 345 ng/dL (ref 300–890)

## 2015-07-01 ENCOUNTER — Encounter: Payer: Self-pay | Admitting: Internal Medicine

## 2015-07-02 ENCOUNTER — Other Ambulatory Visit: Payer: Self-pay | Admitting: Internal Medicine

## 2015-07-02 DIAGNOSIS — F17200 Nicotine dependence, unspecified, uncomplicated: Secondary | ICD-10-CM

## 2015-07-02 DIAGNOSIS — I10 Essential (primary) hypertension: Secondary | ICD-10-CM

## 2015-07-03 ENCOUNTER — Telehealth: Payer: Self-pay | Admitting: *Deleted

## 2015-07-03 NOTE — Telephone Encounter (Signed)
Called and left a message to remind patient to go to [facility] for his CXR.

## 2015-07-05 ENCOUNTER — Ambulatory Visit (HOSPITAL_COMMUNITY)
Admission: RE | Admit: 2015-07-05 | Discharge: 2015-07-05 | Disposition: A | Discharge status: Home / Self Care | Payer: BLUE CROSS/BLUE SHIELD | Source: Ambulatory Visit | Attending: Internal Medicine | Admitting: Internal Medicine

## 2015-07-05 DIAGNOSIS — F17200 Nicotine dependence, unspecified, uncomplicated: Secondary | ICD-10-CM | POA: Diagnosis not present

## 2015-07-05 DIAGNOSIS — I10 Essential (primary) hypertension: Secondary | ICD-10-CM | POA: Insufficient documentation

## 2015-07-05 DIAGNOSIS — J984 Other disorders of lung: Secondary | ICD-10-CM | POA: Diagnosis not present

## 2015-07-16 ENCOUNTER — Other Ambulatory Visit: Payer: Self-pay | Admitting: *Deleted

## 2015-07-16 DIAGNOSIS — Z0001 Encounter for general adult medical examination with abnormal findings: Secondary | ICD-10-CM

## 2015-07-16 DIAGNOSIS — Z1212 Encounter for screening for malignant neoplasm of rectum: Secondary | ICD-10-CM

## 2015-07-16 LAB — POC HEMOCCULT BLD/STL (HOME/3-CARD/SCREEN)
Card #2 Fecal Occult Blod, POC: NEGATIVE
Card #3 Fecal Occult Blood, POC: NEGATIVE
Fecal Occult Blood, POC: NEGATIVE

## 2015-08-14 ENCOUNTER — Other Ambulatory Visit: Payer: Self-pay | Admitting: Internal Medicine

## 2015-08-14 ENCOUNTER — Encounter: Payer: Self-pay | Admitting: Cardiology

## 2015-11-16 ENCOUNTER — Other Ambulatory Visit: Payer: Self-pay | Admitting: *Deleted

## 2015-11-16 MED ORDER — FORMOTEROL FUMARATE 20 MCG/2ML IN NEBU: INHALATION_SOLUTION | RESPIRATORY_TRACT | Status: DC

## 2015-12-24 ENCOUNTER — Other Ambulatory Visit: Payer: Self-pay | Admitting: *Deleted

## 2015-12-24 ENCOUNTER — Encounter: Payer: Self-pay | Admitting: Internal Medicine

## 2015-12-24 DIAGNOSIS — J209 Acute bronchitis, unspecified: Secondary | ICD-10-CM

## 2015-12-24 MED ORDER — ALBUTEROL SULFATE HFA 108 (90 BASE) MCG/ACT IN AERS: INHALATION_SPRAY | RESPIRATORY_TRACT | Status: DC

## 2016-07-21 ENCOUNTER — Encounter: Payer: Self-pay | Admitting: Internal Medicine

## 2016-07-21 ENCOUNTER — Ambulatory Visit (INDEPENDENT_AMBULATORY_CARE_PROVIDER_SITE_OTHER): Payer: BLUE CROSS/BLUE SHIELD | Admitting: Internal Medicine

## 2016-07-21 ENCOUNTER — Ambulatory Visit (HOSPITAL_COMMUNITY)
Admission: RE | Admit: 2016-07-21 | Discharge: 2016-07-21 | Disposition: A | Discharge status: Home / Self Care | Payer: BLUE CROSS/BLUE SHIELD | Source: Ambulatory Visit | Attending: Internal Medicine | Admitting: Internal Medicine

## 2016-07-21 VITALS — BP 116/76 | HR 88 | Temp 97.5°F | Resp 18 | Ht 64.5 in | Wt 162.0 lb

## 2016-07-21 DIAGNOSIS — Z0000 Encounter for general adult medical examination without abnormal findings: Secondary | ICD-10-CM | POA: Diagnosis not present

## 2016-07-21 DIAGNOSIS — I10 Essential (primary) hypertension: Secondary | ICD-10-CM

## 2016-07-21 DIAGNOSIS — J449 Chronic obstructive pulmonary disease, unspecified: Secondary | ICD-10-CM | POA: Diagnosis present

## 2016-07-21 DIAGNOSIS — R5383 Other fatigue: Secondary | ICD-10-CM

## 2016-07-21 DIAGNOSIS — J441 Chronic obstructive pulmonary disease with (acute) exacerbation: Secondary | ICD-10-CM

## 2016-07-21 DIAGNOSIS — Z23 Encounter for immunization: Secondary | ICD-10-CM

## 2016-07-21 DIAGNOSIS — Z1212 Encounter for screening for malignant neoplasm of rectum: Secondary | ICD-10-CM

## 2016-07-21 DIAGNOSIS — Z111 Encounter for screening for respiratory tuberculosis: Secondary | ICD-10-CM

## 2016-07-21 DIAGNOSIS — Z79899 Other long term (current) drug therapy: Secondary | ICD-10-CM

## 2016-07-21 DIAGNOSIS — Z136 Encounter for screening for cardiovascular disorders: Secondary | ICD-10-CM | POA: Diagnosis not present

## 2016-07-21 DIAGNOSIS — E559 Vitamin D deficiency, unspecified: Secondary | ICD-10-CM | POA: Diagnosis not present

## 2016-07-21 DIAGNOSIS — Z125 Encounter for screening for malignant neoplasm of prostate: Secondary | ICD-10-CM

## 2016-07-21 DIAGNOSIS — R05 Cough: Secondary | ICD-10-CM | POA: Insufficient documentation

## 2016-07-21 DIAGNOSIS — K219 Gastro-esophageal reflux disease without esophagitis: Secondary | ICD-10-CM

## 2016-07-21 DIAGNOSIS — E782 Mixed hyperlipidemia: Secondary | ICD-10-CM

## 2016-07-21 DIAGNOSIS — Z0001 Encounter for general adult medical examination with abnormal findings: Secondary | ICD-10-CM

## 2016-07-21 DIAGNOSIS — R7303 Prediabetes: Secondary | ICD-10-CM

## 2016-07-21 DIAGNOSIS — R0989 Other specified symptoms and signs involving the circulatory and respiratory systems: Secondary | ICD-10-CM

## 2016-07-21 LAB — CBC WITH DIFFERENTIAL/PLATELET
Basophils Absolute: 0 cells/uL (ref 0–200)
Basophils Relative: 0 %
Eosinophils Absolute: 78 cells/uL (ref 15–500)
Eosinophils Relative: 1 %
HCT: 53.1 % — ABNORMAL HIGH (ref 38.5–50.0)
Hemoglobin: 18.1 g/dL — ABNORMAL HIGH (ref 13.2–17.1)
Lymphocytes Relative: 25 %
Lymphs Abs: 1950 cells/uL (ref 850–3900)
MCH: 32 pg (ref 27.0–33.0)
MCHC: 34.1 g/dL (ref 32.0–36.0)
MCV: 94 fL (ref 80.0–100.0)
MPV: 10.1 fL (ref 7.5–12.5)
Monocytes Absolute: 780 cells/uL (ref 200–950)
Monocytes Relative: 10 %
Neutro Abs: 4992 cells/uL (ref 1500–7800)
Neutrophils Relative %: 64 %
Platelets: 203 10*3/uL (ref 140–400)
RBC: 5.65 MIL/uL (ref 4.20–5.80)
RDW: 13.1 % (ref 11.0–15.0)
WBC: 7.8 10*3/uL (ref 3.8–10.8)

## 2016-07-21 LAB — BASIC METABOLIC PANEL WITH GFR
BUN: 22 mg/dL (ref 7–25)
CO2: 25 mmol/L (ref 20–31)
Calcium: 9.6 mg/dL (ref 8.6–10.3)
Chloride: 96 mmol/L — ABNORMAL LOW (ref 98–110)
Creat: 1.04 mg/dL (ref 0.70–1.25)
GFR, Est African American: 87 mL/min (ref >=60)
GFR, Est Non African American: 76 mL/min (ref >=60)
Glucose, Bld: 90 mg/dL (ref 65–99)
Potassium: 4.4 mmol/L (ref 3.5–5.3)
Sodium: 134 mmol/L — ABNORMAL LOW (ref 135–146)

## 2016-07-21 LAB — HEPATIC FUNCTION PANEL
ALT: 21 U/L (ref 9–46)
AST: 19 U/L (ref 10–35)
Albumin: 4.5 g/dL (ref 3.6–5.1)
Alkaline Phosphatase: 80 U/L (ref 40–115)
Bilirubin, Direct: 0.1 mg/dL (ref <=0.2)
Indirect Bilirubin: 0.5 mg/dL (ref 0.2–1.2)
Total Bilirubin: 0.6 mg/dL (ref 0.2–1.2)
Total Protein: 7.4 g/dL (ref 6.1–8.1)

## 2016-07-21 LAB — PSA: PSA: 0.5 ng/mL (ref <=4.0)

## 2016-07-21 LAB — IRON AND TIBC
%SAT: 44 % (ref 15–60)
Iron: 156 ug/dL (ref 50–180)
TIBC: 354 ug/dL (ref 250–425)
UIBC: 198 ug/dL (ref 125–400)

## 2016-07-21 LAB — TSH: TSH: 1.97 mIU/L (ref 0.40–4.50)

## 2016-07-21 LAB — VITAMIN B12: Vitamin B-12: 357 pg/mL (ref 200–1100)

## 2016-07-21 LAB — MAGNESIUM: Magnesium: 2 mg/dL (ref 1.5–2.5)

## 2016-07-21 NOTE — Progress Notes (Signed)
[facility]   Douglas Barrera, M.D.    Douglas Barrera. Douglas Barrera, P.A.-C      Douglas Barrera, P.A.-C  [facility]                [address] [place] [id number] Telephone [phone] Telefax [phone] Annual  Screening/Preventative Visit  & Comprehensive Evaluation & Examination     This very nice 65 y.o. MWM presents for a Screening/Preventative Visit & comprehensive evaluation and management of multiple medical co-morbidities.  Patient has been followed for labile HTN, Prediabetes, Hyperlipidemia and Vitamin D Deficiency. Patient has COPD and his informed decision is to continue to smoke despite information of the consequences that he likely will end op oxygen.     Patient has been followed expectantly for labile HTN circa 1997. Patient's BP has been controlled and today's BP is  116/76. Patient had a negative Cardiolite in 2003. Patient denies any cardiac symptoms as chest pain, palpitations, shortness of breath, dizziness or ankle swelling.     Patient's hyperlipidemia is controlled with diet.  Last lipids were at goal:  Lab Results  Component Value Date   CHOL 148 06/28/2015   HDL 28 (L) 06/28/2015   LDLCALC 90 06/28/2015   TRIG 152 (H) 06/28/2015   CHOLHDL 5.3 (H) 06/28/2015      Patient has prediabetes predating circa with A1c 6.0% in 2010 and then 6.2% in 2014 and patient denies reactive hypoglycemic symptoms, visual blurring, diabetic polys or paresthesias. Last A1c was not at goal: Lab Results  Component Value Date   HGBA1C 6.0 (H) 06/28/2015       Finally, patient has history of Vitamin D Deficiency in 2008 of "24" and last vitamin D was  Lab Results  Component Value Date   VD25OH 76 06/28/2015   Current Outpatient Prescriptions on File Prior to Visit  Medication Sig  . albuterol HFA inhaler Inhale  1 to 2 puffs 4 x day or every 4 hours to rescue asthma  . aspirin 81 MG tablet  Take 81 mg by mouth daily.  Marland Kitchen VITAMIN D3 10,000 UNITS  Take 10,000 Units by mouth daily.  Marland Kitchen BENADRYL 25 MG  Take 25 mg by mouth. Take 2 tablets bid  . FLONASE 50 nasal spray USE 2 SPRAYS IN EACH       NOSTRIL DAILY  . formoterol  neb son  1   VIAL VIA NEB 2 x/day  . HUMIBID E 400 MG TABS  Take 400 mg by mouth 2 (two) times daily.   Allergies  Allergen Reactions  . Niacin And Related Other (See Comments)   Past Medical History:  Diagnosis Date  . COPD (chronic obstructive pulmonary disease) ([place])   . GERD (gastroesophageal reflux disease)   . History of elevated lipids   . IBS (irritable bowel syndrome)   . Labile hypertension   . Prediabetes   . Vitamin D deficiency    Health Maintenance  Topic Date Due  . Hepatitis C Screening  10/04/1950  . HIV Screening  12/17/1966  . ZOSTAVAX  12/17/2011  . INFLUENZA VACCINE  04/01/2016  . COLONOSCOPY  09/27/2018  . TETANUS/TDAP  09/02/2019   Immunization History  Administered Date(s) Administered  . Influenza Split 06/20/2013, 06/20/2014, 06/28/2015  . PPD Test 06/20/2014, 06/28/2015  . Pneumococcal Conjugate-13 06/20/2014  . Pneumococcal Polysaccharide-23 04/30/2009  . Tdap 09/01/2009  Past Surgical History:  Procedure Laterality Date  . BASAL CELL CARCINOMA EXCISION  2008   Left [place]  . TESTICLE SURGERY     testicle removed   Family History  Problem Relation Age of Onset  . Breast cancer Mother   . Stomach cancer Mother    Social History   Social History  . Marital status: Married    Spouse name: N/A  . Number of children: N/A  . Years of education: N/A   Occupational History  . Not on file.   Social History Main Topics  . Smoking status: Current Every Day Smoker    Types: Cigars  . Smokeless tobacco: Never Used     Comment: quit smoking cigarettes, but smokes 3-4 cigars a week.  . Alcohol use No  . Drug use: No  . Sexual activity: Not on file    ROS Constitutional: Denies fever, chills, weight  loss/gain, headaches, insomnia,  night sweats or change in appetite. Does c/o fatigue. Eyes: Denies redness, blurred vision, diplopia, discharge, itchy or watery eyes.  ENT: Denies discharge, congestion, post nasal drip, epistaxis, sore throat, earache, hearing loss, dental pain, Tinnitus, Vertigo, Sinus pain or snoring.  Cardio: Denies chest pain, palpitations, irregular heartbeat, syncope, dyspnea, diaphoresis, orthopnea, PND, claudication or edema Respiratory: denies cough, dyspnea, DOE, pleurisy, hoarseness, laryngitis or wheezing.  Gastrointestinal: Denies dysphagia, heartburn, reflux, water brash, pain, cramps, nausea, vomiting, bloating, diarrhea, constipation, hematemesis, melena, hematochezia, jaundice or hemorrhoids Genitourinary: Denies dysuria, frequency, urgency, nocturia, hesitancy, discharge, hematuria or flank pain Musculoskeletal: Denies arthralgia, myalgia, stiffness, Jt. Swelling, pain, limp or strain/sprain. Denies Falls. Skin: Denies puritis, rash, hives, warts, acne, eczema or change in skin lesion Neuro: No weakness, tremor, incoordination, spasms, paresthesia or pain Psychiatric: Denies confusion, memory loss or sensory loss. Denies Depression. Endocrine: Denies change in weight, skin, hair change, nocturia, and paresthesia, diabetic polys, visual blurring or hyper / hypo glycemic episodes.  Heme/Lymph: No excessive bleeding, bruising or enlarged lymph nodes.  Physical Exam  BP 116/76   P88   T 97.5 F    R 18   Ht 5' 4.5"    Wt 162 lb    BMI 27.38   O2 sat 90-92%  General Appearance: Overweight and sedentary &in no apparent distress.  Eyes: PERRLA, EOMs, conjunctiva no swelling or erythema, normal fundi and vessels. Sinuses: No frontal/maxillary tenderness ENT/Mouth: EACs patent / TMs  nl. Nares clear without erythema, swelling, mucoid exudates. Oral hygiene is good. No erythema, swelling, or exudate. Tongue normal, non-obstructing. Tonsils not swollen or  erythematous. Hearing normal.  Neck: Supple, thyroid normal. No bruits, nodes or JVD. Respiratory: Respiratory effort normal.  BS equal with scattered rales and  rhonchi and no wheezing or stridor. Cardio: Heart sounds are normal with regular rate and rhythm and no murmurs, rubs or gallops. Peripheral pulses are normal and equal bilaterally without edema. No aortic or femoral bruits. Chest: symmetric with normal excursions and percussion.  Abdomen: Soft, with Nl bowel sounds. Nontender, no guarding, rebound, hernias, masses, or organomegaly.  Lymphatics: Non tender without lymphadenopathy.  Genitourinary: No hernias.Testes nl. DRE - prostate nl for age - smooth & firm w/o nodules. Musculoskeletal: Full ROM all peripheral extremities, joint stability, 5/5 strength, and normal gait. Skin: Warm and dry without rashes, lesions, cyanosis, clubbing or  ecchymosis.  Neuro: Cranial nerves intact, reflexes equal bilaterally. Normal muscle tone, no cerebellar symptoms. Sensation intact.  Pysch: Alert and oriented X 3 with normal affect, insight and judgment appropriate.   Assessment  and Plan  1. Annual Preventative/Screening Exam   - Microalbumin / creatinine urine ratio - EKG 12-Lead - [place], RETROPERITNL ABD,  LTD - POC Hemoccult Bld/Stl  - Urinalysis, Routine w reflex microscopic  - Vitamin B12 - Iron and TIBC - PSA - Testosterone - CBC with Differential/Platelet - BASIC METABOLIC PANEL WITH GFR - Hepatic function panel - Magnesium - TSH - Hemoglobin A1c - Insulin, random - VITAMIN D 25 Hydroxy   2. Labile hypertension  - Microalbumin / creatinine urine ratio - EKG 12-Lead - [place], RETROPERITNL ABD,  LTD - TSH  3. Hyperlipidemia  - EKG 12-Lead - [place], RETROPERITNL ABD,  LTD - TSH  4. Prediabetes  - EKG 12-Lead - [place], RETROPERITNL ABD,  LTD - Hemoglobin A1c - Insulin, random  5. Vitamin D deficiency  - VITAMIN D 25 Hydroxy   6. Gastroesophageal reflux disease   7.  Screening for rectal cancer  - POC Hemoccult Bld/Stl   8. Prostate cancer screening  - PSA  9. Screening examination for pulmonary tuberculosis  - PPD  10. Other fatigue  - Vitamin B12 - Iron and TIBC - Testosterone - CBC with Differential/Platelet - TSH  11. Medication management  - Urinalysis, Routine w reflex microscopic  - Iron and TIBC - CBC with Differential/Platelet - BASIC METABOLIC PANEL WITH GFR - Hepatic function panel - Magnesium  12. Screening for ischemic heart disease  - EKG 12-Lead  13. Screening for AAA (aortic abdominal aneurysm)  - [place], RETROPERITNL ABD,  LTD  14. COPD exacerbation ([place])  - DG Chest 2 View; Future  15. Need for prophylactic vaccination and inoculation against influenza  - Flu Vaccine QUAD with presevative        Continue prudent diet as discussed, weight control, BP monitoring, regular exercise, and medications as discussed.  Discussed med effects and SE's. Routine screening labs and tests as requested with regular follow-up as recommended. Over 40 minutes of exam, counseling, chart review and high complex critical decision making was performed

## 2016-07-21 NOTE — Patient Instructions (Signed)

## 2016-07-22 LAB — URINALYSIS, ROUTINE W REFLEX MICROSCOPIC
Bilirubin Urine: NEGATIVE
Glucose, UA: NEGATIVE
Hgb urine dipstick: NEGATIVE
Ketones, ur: NEGATIVE
Leukocytes, UA: NEGATIVE
Nitrite: NEGATIVE
Protein, ur: NEGATIVE
Specific Gravity, Urine: 1.012 (ref 1.001–1.035)
pH: 7 (ref 5.0–8.0)

## 2016-07-22 LAB — MICROALBUMIN / CREATININE URINE RATIO
Creatinine, Urine: 47 mg/dL (ref 20–370)
Microalb Creat Ratio: 4 mcg/mg creat (ref <30)
Microalb, Ur: 0.2 mg/dL

## 2016-07-22 LAB — TESTOSTERONE: Testosterone: 377 ng/dL (ref 250–827)

## 2016-07-22 LAB — VITAMIN D 25 HYDROXY (VIT D DEFICIENCY, FRACTURES): Vit D, 25-Hydroxy: 100 ng/mL (ref 30–100)

## 2016-07-22 LAB — HEMOGLOBIN A1C
Hgb A1c MFr Bld: 5.8 % — ABNORMAL HIGH (ref <5.7)
Mean Plasma Glucose: 120 mg/dL

## 2016-07-22 LAB — INSULIN, RANDOM: Insulin: 19.1 u[IU]/mL (ref 2.0–19.6)

## 2016-08-05 ENCOUNTER — Encounter: Payer: Self-pay | Admitting: Internal Medicine

## 2016-08-06 ENCOUNTER — Encounter: Payer: Self-pay | Admitting: *Deleted

## 2016-08-06 ENCOUNTER — Telehealth: Payer: Self-pay | Admitting: *Deleted

## 2016-08-06 NOTE — Telephone Encounter (Signed)
Patient was informed that an order for his nebulizer has been sent to [facility] to replace his broken machine.

## 2016-08-30 ENCOUNTER — Other Ambulatory Visit: Payer: Self-pay | Admitting: Internal Medicine

## 2016-09-03 ENCOUNTER — Other Ambulatory Visit: Payer: Self-pay

## 2016-09-03 DIAGNOSIS — Z1212 Encounter for screening for malignant neoplasm of rectum: Secondary | ICD-10-CM

## 2016-09-03 DIAGNOSIS — Z0001 Encounter for general adult medical examination with abnormal findings: Secondary | ICD-10-CM

## 2016-09-03 LAB — POC HEMOCCULT BLD/STL (HOME/3-CARD/SCREEN)
Card #2 Fecal Occult Blod, POC: NEGATIVE
Card #3 Fecal Occult Blood, POC: NEGATIVE
Fecal Occult Blood, POC: NEGATIVE

## 2016-11-06 ENCOUNTER — Encounter: Payer: Self-pay | Admitting: Internal Medicine

## 2016-11-19 ENCOUNTER — Other Ambulatory Visit: Payer: Self-pay | Admitting: *Deleted

## 2016-11-19 MED ORDER — FORMOTEROL FUMARATE 20 MCG/2ML IN NEBU: INHALATION_SOLUTION | RESPIRATORY_TRACT | 3 refills | Status: DC

## 2017-08-12 ENCOUNTER — Encounter: Payer: Self-pay | Admitting: Internal Medicine

## 2017-08-12 ENCOUNTER — Ambulatory Visit (INDEPENDENT_AMBULATORY_CARE_PROVIDER_SITE_OTHER): Payer: BLUE CROSS/BLUE SHIELD | Admitting: Internal Medicine

## 2017-08-12 VITALS — BP 104/66 | HR 88 | Temp 97.5°F | Resp 20 | Ht 65.0 in | Wt 164.0 lb

## 2017-08-12 DIAGNOSIS — Z79899 Other long term (current) drug therapy: Secondary | ICD-10-CM

## 2017-08-12 DIAGNOSIS — K219 Gastro-esophageal reflux disease without esophagitis: Secondary | ICD-10-CM

## 2017-08-12 DIAGNOSIS — Z0001 Encounter for general adult medical examination with abnormal findings: Secondary | ICD-10-CM

## 2017-08-12 DIAGNOSIS — Z136 Encounter for screening for cardiovascular disorders: Secondary | ICD-10-CM | POA: Diagnosis not present

## 2017-08-12 DIAGNOSIS — R0989 Other specified symptoms and signs involving the circulatory and respiratory systems: Secondary | ICD-10-CM

## 2017-08-12 DIAGNOSIS — I10 Essential (primary) hypertension: Secondary | ICD-10-CM | POA: Diagnosis not present

## 2017-08-12 DIAGNOSIS — J449 Chronic obstructive pulmonary disease, unspecified: Secondary | ICD-10-CM

## 2017-08-12 DIAGNOSIS — E349 Endocrine disorder, unspecified: Secondary | ICD-10-CM | POA: Diagnosis not present

## 2017-08-12 DIAGNOSIS — Z0000 Encounter for general adult medical examination without abnormal findings: Secondary | ICD-10-CM | POA: Diagnosis not present

## 2017-08-12 DIAGNOSIS — F17200 Nicotine dependence, unspecified, uncomplicated: Secondary | ICD-10-CM

## 2017-08-12 DIAGNOSIS — R5383 Other fatigue: Secondary | ICD-10-CM

## 2017-08-12 DIAGNOSIS — E559 Vitamin D deficiency, unspecified: Secondary | ICD-10-CM

## 2017-08-12 DIAGNOSIS — E782 Mixed hyperlipidemia: Secondary | ICD-10-CM

## 2017-08-12 DIAGNOSIS — Z23 Encounter for immunization: Secondary | ICD-10-CM | POA: Diagnosis not present

## 2017-08-12 DIAGNOSIS — Z125 Encounter for screening for malignant neoplasm of prostate: Secondary | ICD-10-CM

## 2017-08-12 DIAGNOSIS — Z122 Encounter for screening for malignant neoplasm of respiratory organs: Secondary | ICD-10-CM

## 2017-08-12 DIAGNOSIS — N401 Enlarged prostate with lower urinary tract symptoms: Secondary | ICD-10-CM

## 2017-08-12 DIAGNOSIS — R7309 Other abnormal glucose: Secondary | ICD-10-CM

## 2017-08-12 DIAGNOSIS — Z1211 Encounter for screening for malignant neoplasm of colon: Secondary | ICD-10-CM

## 2017-08-12 DIAGNOSIS — Z1212 Encounter for screening for malignant neoplasm of rectum: Secondary | ICD-10-CM

## 2017-08-12 DIAGNOSIS — R7303 Prediabetes: Secondary | ICD-10-CM

## 2017-08-12 MED ORDER — VARENICLINE TARTRATE 0.5 MG PO TABS: 0.5000 mg | ORAL_TABLET | Freq: Two times a day (BID) | ORAL | 0 refills | Status: DC

## 2017-08-12 MED ORDER — VARENICLINE TARTRATE 1 MG PO TABS: 1.0000 mg | ORAL_TABLET | Freq: Two times a day (BID) | ORAL | 5 refills | Status: DC

## 2017-08-12 NOTE — Patient Instructions (Signed)

## 2017-08-12 NOTE — Progress Notes (Signed)
ADULT & ADOLESCENT INTERNAL MEDICINE   [name], M.D.     [name], P.A.-C Liane Comber, [place]                [address] [place], [place] [address] Telephone [phone] Telefax [phone] Annual  Screening/Preventative Visit  & Comprehensive Evaluation & Examination     This very nice 66 y.o. MWM presents for a Screening/Preventative Visit & comprehensive evaluation and management of multiple medical co-morbidities.  Patient has been followed for HTN, Prediabetes, Hyperlipidemia and Vitamin D Deficiency. Patient has COPD controlled with MDI  & nebulized meds and unfortunately continues to smoke well informed of the consequences of continuing smoking. Due to his long history of smoking over 40 pk years,  I discussed lung cancer screening with him. He was agreeable to undergo a screening low dose CT scan of the chest.  We discussed smoking cessation techniques/options. He did agree to a trial on Chantix.  I will refer him her for a LDCT lung scan & lung cancer screening program      Patient has been followed with labile HTN since 1997. Patient's BP has been controlled at home.  Today's BP is at goal - 104/66. He had a negative Cardiolite in 2003.  Patient denies any cardiac symptoms as chest pain, palpitations, shortness of breath, dizziness or ankle swelling.     Patient's hyperlipidemia is controlled with diet. Last lipids were at goal: Lab Results  Component Value Date   CHOL 148 06/28/2015   HDL 28 (L) 06/28/2015   LDLCALC 90 06/28/2015   TRIG 152 (H) 06/28/2015   CHOLHDL 5.3 (H) 06/28/2015      Patient has prediabetes (A1c 6.0% / 2010) and patient denies reactive hypoglycemic symptoms, visual blurring, diabetic polys or paresthesias. Last A1c was not at goal: Lab Results  Component Value Date   HGBA1C 5.8 (H) 07/21/2016       Finally, patient has history of Vitamin D Deficiency ("24" / 2008)   and last vitamin D was at goal: Lab Results  Component Value Date   VD25OH 100 07/21/2016   Current Outpatient Medications on File Prior to Visit  Medication Sig  . albuterol (PROVENTIL HFA;VENTOLIN HFA) 108 (90 Base) MCG/ACT inhaler Inhale  1 to 2 puffs 4 x day or every 4 hours to rescue asthma  . aspirin 81 MG tablet Take 81 mg by mouth daily.  . Cholecalciferol (VITAMIN D3) 10000 UNITS capsule Take 10,000 Units by mouth daily.  . diphenhydrAMINE (BENADRYL) 25 MG tablet Take 25 mg by mouth. Take 2 tablets bid  . fluticasone (FLONASE) 50 MCG/ACT nasal spray USE 2 SPRAYS IN EACH       NOSTRIL DAILY  . formoterol (PERFOROMIST) 20 MCG/2ML nebulizer solution INHALE THE CONTENTS OF 1   VIAL VIA NEBULIZER TWO     TIMES A DAY  . guaifenesin (HUMIBID E) 400 MG TABS tablet Take 400 mg by mouth 2 (two) times daily.   No current facility-administered medications on file prior to visit.    Allergies  Allergen Reactions  . Niacin And Related Other (See Comments)   Past Medical History:  Diagnosis Date  . COPD (chronic obstructive pulmonary disease) ([place])   . GERD (gastroesophageal reflux disease)   . History of elevated lipids   . IBS (irritable bowel syndrome)   . Labile hypertension   .  Prediabetes   . Vitamin D deficiency    Health Maintenance  Topic Date Due  . Hepatitis C Screening  1951/03/12  . HIV Screening  12/17/1966  . PNA vac Low Risk Adult (2 of 2 - PPSV23) 12/16/2016  . COLONOSCOPY  09/27/2018  . TETANUS/TDAP  09/02/2019  . INFLUENZA VACCINE  Completed   Immunization History  Administered Date(s) Administered  . Influenza Split 06/20/2013, 06/20/2014, 06/28/2015  . Influenza,inj,quad, With Preservative 07/21/2016  . Influenza-Unspecified 06/17/2017  . PPD Test 06/20/2014, 06/28/2015, 07/21/2016  . Pneumococcal Conjugate-13 06/20/2014  . Pneumococcal Polysaccharide-23 04/30/2009, 08/12/2017  . Tdap 09/01/2009   Past Surgical History:  Procedure Laterality Date  .  BASAL CELL CARCINOMA EXCISION  2008   Left [place]  . TESTICLE SURGERY     testicle removed   Family History  Problem Relation Age of Onset  . Breast cancer Mother   . Stomach cancer Mother    Socioeconomic History  . Marital status: Married    Spouse name: "Vee"  Occupational History  . Retired Chemical engineer  Tobacco Use  . Smoking status: Current Every Day Smoker    Types: Cigars  . Smokeless tobacco: Never Used  . Tobacco comment: quit smoking cigarettes, but smokes 3-4 cigars a week.  Substance and Sexual Activity  . Alcohol use: No    Alcohol/week: 0.0 oz  . Drug use: No  . Sexual activity: Not on file    ROS Constitutional: Denies fever, chills, weight loss/gain, headaches, insomnia,  night sweats or change in appetite. Does c/o fatigue. Eyes: Denies redness, blurred vision, diplopia, discharge, itchy or watery eyes.  ENT: Denies discharge, congestion, post nasal drip, epistaxis, sore throat, earache, hearing loss, dental pain, Tinnitus, Vertigo, Sinus pain or snoring.  Cardio: Denies chest pain, palpitations, irregular heartbeat, syncope, dyspnea, diaphoresis, orthopnea, PND, claudication or edema Respiratory: denies cough, dyspnea, DOE, pleurisy, hoarseness, laryngitis or wheezing.  Gastrointestinal: Denies dysphagia, heartburn, reflux, water brash, pain, cramps, nausea, vomiting, bloating, diarrhea, constipation, hematemesis, melena, hematochezia, jaundice or hemorrhoids Genitourinary: Denies dysuria, frequency, urgency, nocturia, hesitancy, discharge, hematuria or flank pain Musculoskeletal: Denies arthralgia, myalgia, stiffness, Jt. Swelling, pain, limp or strain/sprain. Denies Falls. Skin: Denies puritis, rash, hives, warts, acne, eczema or change in skin lesion Neuro: No weakness, tremor, incoordination, spasms, paresthesia or pain Psychiatric: Denies confusion, memory loss or sensory loss. Denies Depression. Endocrine: Denies change in weight, skin,  hair change, nocturia, and paresthesia, diabetic polys, visual blurring or hyper / hypo glycemic episodes.  Heme/Lymph: No excessive bleeding, bruising or enlarged lymph nodes.  Physical Exam  BP 104/66   Pulse 88   Temp (!) 97.5 F (36.4 C)   Resp 20   Ht 5\' 5"  (1.651 m)   Wt 164 lb (74.4 kg)   BMI 27.29 kg/m  O2 sat is 90% on room air at rest.   General Appearance: Well nourished and well groomed and in no apparent distress.  Eyes: PERRLA, EOMs, conjunctiva no swelling or erythema, normal fundi and vessels. Sinuses: No frontal/maxillary tenderness ENT/Mouth: EACs patent / TMs  nl. Nares clear without erythema, swelling, mucoid exudates. Oral hygiene is good. No erythema, swelling, or exudate. Tongue normal, non-obstructing. Tonsils not swollen or erythematous. Hearing normal.  Neck: Supple, thyroid normal. No bruits, nodes or JVD. Respiratory: Respiratory effort normal.  BS equal and clear bilateral without rales, rhonchi with few wheezing. Cardio: Heart sounds are normal with regular rate and rhythm and no murmurs, rubs or gallops. Peripheral pulses are normal and  equal bilaterally without edema. No aortic or femoral bruits. Chest: symmetric with normal excursions and percussion.  Abdomen: Soft, with Nl bowel sounds. Nontender, no guarding, rebound, hernias, masses, or organomegaly.  Lymphatics: Non tender without lymphadenopathy.  Genitourinary: No hernias.Testes nl. DRE - prostate nl for age - smooth & firm w/o nodules. Musculoskeletal: Full ROM all peripheral extremities, joint stability, 5/5 strength, and normal gait. Skin: Warm and dry without rashes, lesions, cyanosis, clubbing or  ecchymosis.  Neuro: Cranial nerves intact, reflexes equal bilaterally. Normal muscle tone, no cerebellar symptoms. Sensation intact.  Pysch: Alert and oriented X 3 with normal affect, insight and judgment appropriate.   Assessment and Plan  1. Annual Preventative/Screening Exam   2. Labile  hypertension  - EKG 12-Lead - [place], RETROPERITNL ABD,  LTD - Urinalysis, Routine w reflex microscopic - Microalbumin / creatinine urine ratio - BASIC METABOLIC PANEL WITH GFR - Magnesium - TSH - CBC with Differential/Platelet  3. Hyperlipidemia, mixed  - EKG 12-Lead - [place], RETROPERITNL ABD,  LTD - Hepatic function panel - Lipid panel - TSH  4. Prediabetes  - EKG 12-Lead - [place], RETROPERITNL ABD,  LTD - Hemoglobin A1c - Insulin, random  5. Vitamin D deficiency  - VITAMIN D 25 Hydroxy  6. Abnormal glucose  - Hemoglobin A1c - Insulin, random  7. Gastroesophageal reflux disease  - CBC with Differential/Platelet  8. Chronic obstructive pulmonary disease(HCC)   9. Benign localized prostatic hyperplasia with lower urinary tract symptoms (LUTS)  - PSA  10. Screening for colorectal cancer  - POC Hemoccult Bld/Stl   11. Prostate cancer screening  - PSA  12. Need for prophylactic vaccination against Streptococcus pneumoniae (pneumococcus)  - Pneumococcal polysaccharide vaccine 23-valent greater than or equal to 2yo subcutaneous/IM  13. Screening for ischemic heart disease  - EKG 12-Lead  14. Screening for AAA (aortic abdominal aneurysm)  - [place], RETROPERITNL ABD,  LTD  15. Smoker  - EKG 12-Lead - [place], RETROPERITNL ABD,  LTD - CT CHEST LUNG CA SCREEN LOW DOSE W/O CM; Future  16. Fatigue  - TSH - Iron,Total/Total Iron Binding Cap - Testosterone - Vitamin B12  17. Medication management  - Urinalysis, Routine w reflex microscopic - Microalbumin / creatinine urine ratio - BASIC METABOLIC PANEL WITH GFR - Hepatic function panel - Lipid panel - Magnesium - TSH - Hemoglobin A1c - Insulin, random - VITAMIN D 25 Hydroxy  - CBC with Differential/Platelet  18. Encounter for screening for malignant neoplasm of respiratory organs  - CT CHEST LUNG CA SCREEN LOW DOSE W/O CM; Future       Patient was counseled in prudent diet, weight control to  achieve/maintain BMI less than 25, BP monitoring, regular exercise and medications as discussed.  Discussed med effects and SE's. Routine screening labs and tests as requested with regular follow-up as recommended. Over 40 minutes of exam, counseling, chart review and high complex critical decision making was performed

## 2017-08-13 LAB — BASIC METABOLIC PANEL WITH GFR
BUN: 22 mg/dL (ref 7–25)
CO2: 31 mmol/L (ref 20–32)
Calcium: 10 mg/dL (ref 8.6–10.3)
Chloride: 97 mmol/L — ABNORMAL LOW (ref 98–110)
Creat: 1.14 mg/dL (ref 0.70–1.25)
GFR, Est African American: 78 mL/min/{1.73_m2} (ref >=60)
GFR, Est Non African American: 67 mL/min/{1.73_m2} (ref >=60)
Glucose, Bld: 110 mg/dL — ABNORMAL HIGH (ref 65–99)
Potassium: 5.3 mmol/L (ref 3.5–5.3)
Sodium: 136 mmol/L (ref 135–146)

## 2017-08-13 LAB — CBC WITH DIFFERENTIAL/PLATELET
Basophils Absolute: 44 cells/uL (ref 0–200)
Basophils Relative: 0.3 %
Eosinophils Absolute: 29 cells/uL (ref 15–500)
Eosinophils Relative: 0.2 %
HCT: 55.9 % — ABNORMAL HIGH (ref 38.5–50.0)
Hemoglobin: 19.5 g/dL — ABNORMAL HIGH (ref 13.2–17.1)
Lymphs Abs: 1489 cells/uL (ref 850–3900)
MCH: 31.9 pg (ref 27.0–33.0)
MCHC: 34.9 g/dL (ref 32.0–36.0)
MCV: 91.3 fL (ref 80.0–100.0)
MPV: 10.4 fL (ref 7.5–12.5)
Monocytes Relative: 9 %
Neutro Abs: 11724 cells/uL — ABNORMAL HIGH (ref 1500–7800)
Neutrophils Relative %: 80.3 %
Platelets: 199 10*3/uL (ref 140–400)
RBC: 6.12 10*6/uL — ABNORMAL HIGH (ref 4.20–5.80)
RDW: 11.7 % (ref 11.0–15.0)
Total Lymphocyte: 10.2 %
WBC mixed population: 1314 cells/uL — ABNORMAL HIGH (ref 200–950)
WBC: 14.6 10*3/uL — ABNORMAL HIGH (ref 3.8–10.8)

## 2017-08-13 LAB — VITAMIN D 25 HYDROXY (VIT D DEFICIENCY, FRACTURES): Vit D, 25-Hydroxy: 114 ng/mL — ABNORMAL HIGH (ref 30–100)

## 2017-08-13 LAB — VITAMIN B12: Vitamin B-12: 313 pg/mL (ref 200–1100)

## 2017-08-13 LAB — IRON, TOTAL/TOTAL IRON BINDING CAP
%SAT: 25 % (calc) (ref 15–60)
Iron: 94 ug/dL (ref 50–180)
TIBC: 376 mcg/dL (calc) (ref 250–425)

## 2017-08-13 LAB — HEMOGLOBIN A1C
Hgb A1c MFr Bld: 5.8 % of total Hgb — ABNORMAL HIGH (ref <5.7)
Mean Plasma Glucose: 120 (calc)
eAG (mmol/L): 6.6 (calc)

## 2017-08-13 LAB — URINALYSIS, ROUTINE W REFLEX MICROSCOPIC
Bacteria, UA: NONE SEEN /HPF
Bilirubin Urine: NEGATIVE
Glucose, UA: NEGATIVE
Hgb urine dipstick: NEGATIVE
Hyaline Cast: NONE SEEN /LPF
Ketones, ur: NEGATIVE
Nitrite: NEGATIVE
Protein, ur: NEGATIVE
RBC / HPF: NONE SEEN /HPF (ref 0–2)
Specific Gravity, Urine: 1.014 (ref 1.001–1.03)
Squamous Epithelial / LPF: NONE SEEN /HPF (ref <=5)
WBC, UA: NONE SEEN /HPF (ref 0–5)
pH: 7 (ref 5.0–8.0)

## 2017-08-13 LAB — HEPATIC FUNCTION PANEL
AG Ratio: 1.5 (calc) (ref 1.0–2.5)
ALT: 23 U/L (ref 9–46)
AST: 19 U/L (ref 10–35)
Albumin: 4.5 g/dL (ref 3.6–5.1)
Alkaline phosphatase (APISO): 90 U/L (ref 40–115)
Bilirubin, Direct: 0.1 mg/dL (ref 0.0–0.2)
Globulin: 3.1 g/dL (calc) (ref 1.9–3.7)
Indirect Bilirubin: 0.6 mg/dL (calc) (ref 0.2–1.2)
Total Bilirubin: 0.7 mg/dL (ref 0.2–1.2)
Total Protein: 7.6 g/dL (ref 6.1–8.1)

## 2017-08-13 LAB — TESTOSTERONE: Testosterone: 366 ng/dL (ref 250–827)

## 2017-08-13 LAB — MAGNESIUM: Magnesium: 2 mg/dL (ref 1.5–2.5)

## 2017-08-13 LAB — INSULIN, RANDOM: Insulin: 33.6 u[IU]/mL — ABNORMAL HIGH (ref 2.0–19.6)

## 2017-08-13 LAB — MICROALBUMIN / CREATININE URINE RATIO
Creatinine, Urine: 65 mg/dL (ref 20–320)
Microalb Creat Ratio: 6 mcg/mg creat (ref <30)
Microalb, Ur: 0.4 mg/dL

## 2017-08-13 LAB — LIPID PANEL
Cholesterol: 147 mg/dL (ref <200)
HDL: 38 mg/dL — ABNORMAL LOW (ref >40)
LDL Cholesterol (Calc): 90 mg/dL (calc)
Non-HDL Cholesterol (Calc): 109 mg/dL (calc) (ref <130)
Total CHOL/HDL Ratio: 3.9 (calc) (ref <5.0)
Triglycerides: 91 mg/dL (ref <150)

## 2017-08-13 LAB — TSH: TSH: 1.75 mIU/L (ref 0.40–4.50)

## 2017-08-13 LAB — PSA: PSA: 0.8 ng/mL (ref <=4.0)

## 2017-08-23 ENCOUNTER — Other Ambulatory Visit: Payer: Self-pay | Admitting: Internal Medicine

## 2017-08-23 ENCOUNTER — Encounter (INDEPENDENT_AMBULATORY_CARE_PROVIDER_SITE_OTHER): Payer: Self-pay

## 2017-08-23 DIAGNOSIS — R05 Cough: Secondary | ICD-10-CM

## 2017-08-23 DIAGNOSIS — R059 Cough, unspecified: Secondary | ICD-10-CM

## 2017-08-23 DIAGNOSIS — J209 Acute bronchitis, unspecified: Secondary | ICD-10-CM

## 2017-08-23 MED ORDER — PROMETHAZINE-DM 6.25-15 MG/5ML PO SYRP: ORAL_SOLUTION | ORAL | 1 refills | Status: DC

## 2017-08-23 MED ORDER — PREDNISONE 20 MG PO TABS: ORAL_TABLET | ORAL | 0 refills | Status: AC

## 2017-08-23 MED ORDER — AZITHROMYCIN 250 MG PO TABS: ORAL_TABLET | ORAL | 1 refills | Status: AC

## 2017-08-26 ENCOUNTER — Ambulatory Visit
Admission: RE | Admit: 2017-08-26 | Discharge: 2017-08-26 | Disposition: A | Discharge status: Home / Self Care | Payer: BLUE CROSS/BLUE SHIELD | Source: Ambulatory Visit | Attending: Internal Medicine | Admitting: Internal Medicine

## 2017-08-26 DIAGNOSIS — Z122 Encounter for screening for malignant neoplasm of respiratory organs: Secondary | ICD-10-CM

## 2017-08-26 DIAGNOSIS — F17200 Nicotine dependence, unspecified, uncomplicated: Secondary | ICD-10-CM

## 2017-09-09 ENCOUNTER — Ambulatory Visit (INDEPENDENT_AMBULATORY_CARE_PROVIDER_SITE_OTHER): Payer: BLUE CROSS/BLUE SHIELD | Admitting: Internal Medicine

## 2017-09-09 ENCOUNTER — Encounter: Payer: Self-pay | Admitting: Internal Medicine

## 2017-09-09 VITALS — BP 106/64 | HR 100 | Temp 97.3°F | Resp 20 | Ht 65.0 in | Wt 159.8 lb

## 2017-09-09 DIAGNOSIS — J209 Acute bronchitis, unspecified: Secondary | ICD-10-CM | POA: Diagnosis not present

## 2017-09-09 DIAGNOSIS — J449 Chronic obstructive pulmonary disease, unspecified: Secondary | ICD-10-CM

## 2017-09-09 DIAGNOSIS — J3089 Other allergic rhinitis: Secondary | ICD-10-CM

## 2017-09-09 DIAGNOSIS — I700 Atherosclerosis of aorta: Secondary | ICD-10-CM | POA: Insufficient documentation

## 2017-09-09 DIAGNOSIS — C44300 Unspecified malignant neoplasm of skin of unspecified part of face: Secondary | ICD-10-CM | POA: Diagnosis not present

## 2017-09-09 MED ORDER — IPRATROPIUM BROMIDE 0.06 % NA SOLN: NASAL | 3 refills | Status: DC

## 2017-09-09 MED ORDER — ALBUTEROL SULFATE HFA 108 (90 BASE) MCG/ACT IN AERS: INHALATION_SPRAY | RESPIRATORY_TRACT | 1 refills | Status: DC

## 2017-09-09 MED ORDER — FORMOTEROL FUMARATE 20 MCG/2ML IN NEBU: INHALATION_SOLUTION | RESPIRATORY_TRACT | 3 refills | Status: DC

## 2017-09-09 MED ORDER — FLUTICASONE PROPIONATE 50 MCG/ACT NA SUSP: 2.0000 | Freq: Every day | NASAL | 3 refills | Status: AC

## 2017-09-09 NOTE — Progress Notes (Signed)
Subjective:    Patient ID: Douglas Barrera, male    DOB: Dec 15, 1950, 67 y.o.   MRN: [id number]  HPI  This very nice 67 yo MWM with HTN, HLD, COPD presents with c/o sinus HA & congestion and dry cough & wheezing.  Denies fevers, chills, sweats rash. He also has concerns re: an ulcerated skin lesion in his Left sideburn area.   Medication Sig  . aspirin 81 MG tablet Take 81 mg by mouth daily.  Marland Kitchen b complex vitamins tablet Take 1 tablet by mouth daily.  . Cholecalciferol (VITAMIN D3) 10000 UNITS capsule Take 10,000 Units by mouth daily.  . diphenhydrAMINE (BENADRYL) 25 MG tablet Take 25 mg by mouth. Take 2 tablets bid  . guaifenesin (HUMIBID E) 400 MG TABS tablet Take 400 mg by mouth 2 (two) times daily.  . promethazine-dextromethorphan (PROMETHAZINE-DM) 6.25-15 MG/5ML syrup Take 1 to 2 tsp enery 4 hours if needed for cough  . varenicline (CHANTIX CONTINUING MONTH PAK) 1 MG tablet Take 1 tablet (1 mg total) by mouth 2 (two) times daily.  . varenicline (CHANTIX) 0.5 MG tablet Take 1 tablet (0.5 mg total) by mouth 2 (two) times daily.  Marland Kitchen zinc gluconate 50 MG tablet Take 50 mg by mouth daily.  Marland Kitchen albuterol (PROVENTIL HFA;VENTOLIN HFA) 108 (90 Base) MCG/ACT inhaler Inhale  1 to 2 puffs 4 x day or every 4 hours to rescue asthma  . fluticasone (FLONASE) 50 MCG/ACT nasal spray USE 2 SPRAYS IN EACH       NOSTRIL DAILY  . formoterol (PERFOROMIST) 20 MCG/2ML nebulizer solution INHALE THE CONTENTS OF 1   VIAL VIA NEBULIZER TWO     TIMES A DAY   Allergies  Allergen Reactions  . Niacin And Related Other (See Comments)   Past Medical History:  Diagnosis Date  . COPD (chronic obstructive pulmonary disease) ([place])   . GERD (gastroesophageal reflux disease)   . History of elevated lipids   . IBS (irritable bowel syndrome)   . Labile hypertension   . Prediabetes   . Vitamin D deficiency    Past Surgical History:  Procedure Laterality Date  . BASAL CELL CARCINOMA EXCISION  2008   Left [place]  . TESTICLE  SURGERY     testicle removed   Review of Systems  10 point systems review negative except as above.    Objective:   Physical Exam  BP 106/64   Pulse 100   Temp (!) 97.3 F (36.3 C)   Resp 20   Ht 5\' 5"  (1.651 m)   Wt 159 lb 12.8 oz (72.5 kg)   BMI 26.59 kg/m   Congested wheezy cough. No Stridor.   HEENT -  TM's Nl. (+) tender bilat fronto-maxillary areas. N/O/P clear.  Neck - supple.  Chest - scattered post tussive coarse rales , rhonchi & wheezes. Cor - Nl HS. RRR w/o sig MGR. PP 1(+). No edema. MS- FROM w/o deformities.  Gait Nl. Neuro -  Nl w/o focal abnormalities. Skin - 6 x 30mm pink fleshy raised ulcerated lesion in the Left sideburn area.   Procedure (CPT [phone]) - After informed consent and aseptic prep and local anesthesia with 2 ml of Marcaine 0.5% . 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 Then the opposing wound edges were approximated and aligned and secured with # 4 vertical sutures with 4-0 nylon and wound was  covered with Triple Ab Ung and sterile Dsg. Patient was instructed in wound care.     Assessment & Plan:   1. Acute bronchitis  - albuterol (PROVENTIL HFA;VENTOLIN HFA) 108 (90 Base) MCG/ACT inhaler; Inhale  1 to 2 puffs 4 x day or every 4 hours to rescue asthma  Dispense: 48 g; Refill: 1  2. Non-seasonal allergic rhinitis, unspecified trigger  - fluticasone (FLONASE) 50 MCG/ACT nasal spray; Place 2 sprays into both nostrils daily.  Dispense: 48 g; Refill: 3  - ipratropium (ATROVENT) 0.06 % nasal spray; Use 1 to 2 sprays each nostril 2 to 3 x /day  Dispense: 45 mL; Refill: 3  3. Chronic asthmatic bronchitis (HCC)  - formoterol (PERFOROMIST) 20 MCG/2ML nebulizer solution; INHALE THE CONTENTS OF 1   VIAL VIA NEBULIZER TWO     TIMES A DAY  Dispense: 360 mL; Refill: 3  - Given Sx's Bevespi  X 3-4 months to try 2 inhal  bid  4. Skin cancer of face  - Dermatology pathology  - Excised  - ROV 5-6 days for suture removal

## 2017-09-14 ENCOUNTER — Encounter: Payer: Self-pay | Admitting: Internal Medicine

## 2017-09-14 ENCOUNTER — Ambulatory Visit: Payer: BLUE CROSS/BLUE SHIELD | Admitting: Internal Medicine

## 2017-09-14 ENCOUNTER — Other Ambulatory Visit: Payer: Self-pay | Admitting: Internal Medicine

## 2017-09-14 VITALS — BP 108/64 | HR 88 | Temp 97.3°F | Resp 18 | Ht 65.0 in | Wt 161.5 lb

## 2017-09-14 DIAGNOSIS — C44300 Unspecified malignant neoplasm of skin of unspecified part of face: Secondary | ICD-10-CM

## 2017-09-14 DIAGNOSIS — C44320 Squamous cell carcinoma of skin of unspecified parts of face: Secondary | ICD-10-CM

## 2017-09-14 NOTE — Progress Notes (Signed)
Patient ID: Douglas Barrera, male   DOB: 02/22/1951, 67 y.o.   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 Referral is made to the skin cancer surgery center.   All sutures removed with noted poor healing of the central wound margins. No sign of infection.

## 2017-09-21 ENCOUNTER — Other Ambulatory Visit: Payer: Self-pay

## 2017-09-21 DIAGNOSIS — Z1212 Encounter for screening for malignant neoplasm of rectum: Principal | ICD-10-CM

## 2017-09-21 DIAGNOSIS — Z1211 Encounter for screening for malignant neoplasm of colon: Secondary | ICD-10-CM

## 2017-09-21 LAB — POC HEMOCCULT BLD/STL (HOME/3-CARD/SCREEN)
Card #2 Fecal Occult Blod, POC: NEGATIVE
Card #3 Fecal Occult Blood, POC: NEGATIVE
Fecal Occult Blood, POC: NEGATIVE

## 2017-09-23 DIAGNOSIS — Z1212 Encounter for screening for malignant neoplasm of rectum: Secondary | ICD-10-CM | POA: Diagnosis not present

## 2017-09-26 ENCOUNTER — Encounter (INDEPENDENT_AMBULATORY_CARE_PROVIDER_SITE_OTHER): Payer: Self-pay

## 2017-09-26 ENCOUNTER — Other Ambulatory Visit: Payer: Self-pay | Admitting: Internal Medicine

## 2017-09-26 MED ORDER — DOXYCYCLINE HYCLATE 100 MG PO CAPS: ORAL_CAPSULE | ORAL | 0 refills | Status: DC

## 2017-09-26 MED ORDER — PREDNISONE 20 MG PO TABS: ORAL_TABLET | ORAL | 0 refills | Status: DC

## 2017-10-14 ENCOUNTER — Other Ambulatory Visit: Payer: Self-pay | Admitting: Internal Medicine

## 2017-10-14 DIAGNOSIS — J209 Acute bronchitis, unspecified: Secondary | ICD-10-CM

## 2017-10-14 MED ORDER — ALBUTEROL SULFATE HFA 108 (90 BASE) MCG/ACT IN AERS: INHALATION_SPRAY | RESPIRATORY_TRACT | 1 refills | Status: DC

## 2017-10-14 MED ORDER — ALBUTEROL SULFATE HFA 108 (90 BASE) MCG/ACT IN AERS: INHALATION_SPRAY | RESPIRATORY_TRACT | 3 refills | Status: DC

## 2017-11-01 ENCOUNTER — Encounter (INDEPENDENT_AMBULATORY_CARE_PROVIDER_SITE_OTHER): Payer: Self-pay

## 2017-11-19 ENCOUNTER — Telehealth: Payer: Self-pay | Admitting: *Deleted

## 2017-11-19 NOTE — Telephone Encounter (Signed)
A message was left to inform the patient of the forms for his insurance being ready to pick up.

## 2018-02-17 ENCOUNTER — Ambulatory Visit: Payer: BLUE CROSS/BLUE SHIELD | Admitting: Internal Medicine

## 2018-02-17 ENCOUNTER — Encounter: Payer: Self-pay | Admitting: Internal Medicine

## 2018-02-17 VITALS — BP 118/76 | HR 104 | Temp 97.0°F | Resp 18 | Ht 65.0 in | Wt 158.2 lb

## 2018-02-17 DIAGNOSIS — E782 Mixed hyperlipidemia: Secondary | ICD-10-CM

## 2018-02-17 DIAGNOSIS — Z79899 Other long term (current) drug therapy: Secondary | ICD-10-CM

## 2018-02-17 DIAGNOSIS — G4700 Insomnia, unspecified: Secondary | ICD-10-CM

## 2018-02-17 DIAGNOSIS — E559 Vitamin D deficiency, unspecified: Secondary | ICD-10-CM

## 2018-02-17 DIAGNOSIS — R7303 Prediabetes: Secondary | ICD-10-CM | POA: Diagnosis not present

## 2018-02-17 DIAGNOSIS — J0100 Acute maxillary sinusitis, unspecified: Secondary | ICD-10-CM | POA: Diagnosis not present

## 2018-02-17 DIAGNOSIS — R0989 Other specified symptoms and signs involving the circulatory and respiratory systems: Secondary | ICD-10-CM

## 2018-02-17 MED ORDER — TRAZODONE HCL 150 MG PO TABS: ORAL_TABLET | ORAL | 0 refills | Status: DC

## 2018-02-17 MED ORDER — PREDNISONE 20 MG PO TABS: ORAL_TABLET | ORAL | 0 refills | Status: DC

## 2018-02-17 MED ORDER — LEVOFLOXACIN 500 MG PO TABS: ORAL_TABLET | ORAL | 1 refills | Status: DC

## 2018-02-17 NOTE — Progress Notes (Signed)
This very nice 67 y.o.male presents for 3 month follow up with HTN, HLD, Pre-Diabetes and Vitamin D Deficiency. Patient also has COPD consequent of long standing smoking and id on maintenance inhalers..      Patient is treated for HTN  (1997) & BP has been controlled at home. Today's BP is at goal - 118/76. Patient has had no complaints of any cardiac type chest pain, palpitations, dyspnea / orthopnea / PND, dizziness, claudication, or dependent edema.     Hyperlipidemia is controlled with diet & meds. Patient denies myalgias or other med SE's. Last Lipids were at goal; Lab Results  Component Value Date   CHOL 144 02/17/2018   HDL 42 02/17/2018   LDLCALC 85 02/17/2018   TRIG 82 02/17/2018   CHOLHDL 3.4 02/17/2018      Also, the patient has history of PreDiabetes (A1c 6.0%/2010)  and has had no symptoms of reactive hypoglycemia, diabetic polys, paresthesias or visual blurring.  Last A1c was near goal: Lab Results  Component Value Date   HGBA1C 5.8 (H) 08/12/2017      Further, the patient also has history of Vitamin D Deficiency ("24"/2008)  and supplements vitamin D without any suspected side-effects. Last vitamin D was  Sl elevated and he decreased his Vit D dose from 10,000 u/d to 5,000 u/day:  Lab Results  Component Value Date   VD25OH 114 (H) 08/12/2017   Current Outpatient Medications on File Prior to Visit  Medication Sig  . albuterol (PROVENTIL HFA;VENTOLIN HFA) 108 (90 Base) MCG/ACT inhaler Inhale 1 to 2 puffs 4 x day or every 4 hours to rescue asthma  . aspirin 81 MG tablet Take 81 mg by mouth daily.  Marland Kitchen b complex vitamins tablet Take 1 tablet by mouth daily.  . Cholecalciferol (VITAMIN D3) 10000 UNITS capsule Take 10,000 Units by mouth daily.  . diphenhydrAMINE (BENADRYL) 25 MG tablet Take 25 mg by mouth. Take 2 tablets bid  . fluticasone (FLONASE) 50 MCG/ACT nasal spray Place 2 sprays into both nostrils daily.  . formoterol (PERFOROMIST) 20 MCG/2ML nebulizer solution  INHALE THE CONTENTS OF 1   VIAL VIA NEBULIZER TWO     TIMES A DAY  . guaifenesin (HUMIBID E) 400 MG TABS tablet Take 400 mg by mouth 2 (two) times daily.  Marland Kitchen ipratropium (ATROVENT) 0.06 % nasal spray Use 1 to 2 sprays each nostril 2 to 3 x /day  . zinc gluconate 50 MG tablet Take 50 mg by mouth daily.   No current facility-administered medications on file prior to visit.    Allergies  Allergen Reactions  . Niacin And Related Other (See Comments)   PMHx:   Past Medical History:  Diagnosis Date  . COPD (chronic obstructive pulmonary disease) ([place])   . GERD (gastroesophageal reflux disease)   . History of elevated lipids   . IBS (irritable bowel syndrome)   . Labile hypertension   . Prediabetes   . Vitamin D deficiency    Immunization History  Administered Date(s) Administered  . Influenza Split 06/20/2013, 06/20/2014, 06/28/2015  . Influenza,inj,quad, With Preservative 07/21/2016  . Influenza-Unspecified 06/17/2017  . PPD Test 06/20/2014, 06/28/2015, 07/21/2016  . Pneumococcal Conjugate-13 06/20/2014  . Pneumococcal Polysaccharide-23 04/30/2009, 08/12/2017  . Tdap 09/01/2009   Past Surgical History:  Procedure Laterality Date  . BASAL CELL CARCINOMA EXCISION  2008   Left [place]  . TESTICLE SURGERY     testicle removed   FHx:    Reviewed / unchanged  SHx:    Reviewed / unchanged   Systems Review:  Constitutional: Denies fever, chills, wt changes, headaches, insomnia, fatigue, night sweats, change in appetite. Eyes: Denies redness, blurred vision, diplopia, discharge, itchy, watery eyes.  ENT: Denies discharge, congestion, post nasal drip, epistaxis, sore throat, earache, hearing loss, dental pain, tinnitus, vertigo, sinus pain, snoring.  CV: Denies chest pain, palpitations, irregular heartbeat, syncope, dyspnea, diaphoresis, orthopnea, PND, claudication or edema. Respiratory: denies cough, dyspnea, DOE, pleurisy, hoarseness, laryngitis, wheezing.  Gastrointestinal: Denies  dysphagia, odynophagia, heartburn, reflux, water brash, abdominal pain or cramps, nausea, vomiting, bloating, diarrhea, constipation, hematemesis, melena, hematochezia  or hemorrhoids. Genitourinary: Denies dysuria, frequency, urgency, nocturia, hesitancy, discharge, hematuria or flank pain. Musculoskeletal: Denies arthralgias, myalgias, stiffness, jt. swelling, pain, limping or strain/sprain.  Skin: Denies pruritus, rash, hives, warts, acne, eczema or change in skin lesion(s). Neuro: No weakness, tremor, incoordination, spasms, paresthesia or pain. Psychiatric: Denies confusion, memory loss or sensory loss. Endo: Denies change in weight, skin or hair change.  Heme/Lymph: No excessive bleeding, bruising or enlarged lymph nodes.  Physical Exam  BP 118/76   Pulse (!) 104   Temp (!) 97 F (36.1 C)   Resp 18   Ht 5\' 5"  (1.651 m)   Wt 158 lb 3.2 oz (71.8 kg)   BMI 26.33 kg/m   Appears  well nourished, well groomed  and in no distress.  Eyes: PERRLA, EOMs, conjunctiva no swelling or erythema. Sinuses: No frontal tenderness, but (+) tender at R>L maxillary areas.  ENT/Mouth: EAC's clear, TM's nl w/o erythema, bulging. Nares clear w/o erythema, swelling, exudates. Oropharynx clear without erythema or exudates. Oral hygiene is good. Tongue normal, non obstructing. Hearing intact.  Neck: Supple. Thyroid not palpable. Car 2+/2+ without bruits, nodes or JVD. Chest: Respirations nl with BS clear & equal w/o rales, rhonchi, wheezing or stridor.  Cor: Heart sounds normal w/ regular rate and rhythm without sig. murmurs, gallops, clicks or rubs. Peripheral pulses normal and equal  without edema.  Abdomen: Soft & bowel sounds normal. Non-tender w/o guarding, rebound, hernias, masses or organomegaly.  Lymphatics: Unremarkable.  Musculoskeletal: Full ROM all peripheral extremities, joint stability, 5/5 strength and normal gait.  Skin: Warm, dry without exposed rashes, lesions or ecchymosis apparent.    Neuro: Cranial nerves intact, reflexes equal bilaterally. Sensory-motor testing grossly intact. Tendon reflexes grossly intact.  Pysch: Alert & oriented x 3.  Insight and judgement nl & appropriate. No ideations.  Assessment and Plan:  1. Labile hypertension  - Continue medication, monitor blood pressure at home.  - Continue DASH diet.  Reminder to go to the ER if any CP,  SOB, nausea, dizziness, severe HA, changes vision/speech.  - CBC with Differential/Platelet - COMPLETE METABOLIC PANEL WITH GFR - Magnesium - TSH  2. Hyperlipidemia, mixed  - Continue diet/meds, exercise,& lifestyle modifications.  - Continue monitor periodic cholesterol/liver & renal functions   - Lipid panel - TSH  3. Prediabetes  - Continue diet, exercise, lifestyle modifications.  - Monitor appropriate labs  - Hemoglobin A1c - Insulin, random  4. Vitamin D deficiency  - Continue supplementation.  - VITAMIN D 25 Hydroxyl  5. Medication management  - CBC with Differential/Platelet - COMPLETE METABOLIC PANEL WITH GFR - Magnesium - Lipid panel - TSH - Hemoglobin A1c - Insulin, random - VITAMIN D 25 Hydroxyl  6. Subacute maxillary sinusitis  - predniSONE  20 MG; 1 tab 3 x day for 3 days, then 1 tab 2 x day for 3 days, then 1 tab  1 x day for 5 days  Disp: 20 tab  - levofloxacin  500 MG tablet; Take 1 tablet daily with food for infection  Dispense: 15 tablet; Refill: 1  7. Insomnia - traZODone (DESYREL) 150 MG tablet; Take 1/3 to 1/2 to 1 tablet 1 hour before Sleep -Dispense: 90 tablet; Refill: 0          Discussed  regular exercise, BP monitoring, weight control to achieve/maintain BMI less than 25 and discussed med and SE's. Recommended labs to assess and monitor clinical status with further disposition pending results of labs. Over 30 minutes of exam, counseling, chart review was performed.

## 2018-02-17 NOTE — Patient Instructions (Signed)

## 2018-02-18 ENCOUNTER — Other Ambulatory Visit: Payer: Self-pay | Admitting: Internal Medicine

## 2018-02-18 DIAGNOSIS — D751 Secondary polycythemia: Secondary | ICD-10-CM

## 2018-02-24 ENCOUNTER — Encounter: Payer: Self-pay | Admitting: Internal Medicine

## 2018-02-25 ENCOUNTER — Encounter: Payer: Self-pay | Admitting: Internal Medicine

## 2018-03-02 ENCOUNTER — Encounter: Payer: Self-pay | Admitting: Internal Medicine

## 2018-03-03 ENCOUNTER — Encounter: Payer: Self-pay | Admitting: Internal Medicine

## 2018-03-16 ENCOUNTER — Other Ambulatory Visit: Payer: Self-pay | Admitting: Family

## 2018-03-16 DIAGNOSIS — D582 Other hemoglobinopathies: Secondary | ICD-10-CM

## 2018-03-16 DIAGNOSIS — D45 Polycythemia vera: Secondary | ICD-10-CM

## 2018-03-17 ENCOUNTER — Inpatient Hospital Stay: Payer: BLUE CROSS/BLUE SHIELD | Attending: Family | Admitting: Family

## 2018-03-17 ENCOUNTER — Inpatient Hospital Stay: Payer: BLUE CROSS/BLUE SHIELD

## 2018-03-17 ENCOUNTER — Other Ambulatory Visit: Payer: Self-pay

## 2018-03-17 ENCOUNTER — Encounter: Payer: Self-pay | Admitting: Family

## 2018-03-17 VITALS — BP 113/58 | HR 93 | Temp 98.5°F | Resp 20 | Wt 157.1 lb

## 2018-03-17 DIAGNOSIS — Z79899 Other long term (current) drug therapy: Secondary | ICD-10-CM

## 2018-03-17 DIAGNOSIS — K589 Irritable bowel syndrome without diarrhea: Secondary | ICD-10-CM | POA: Insufficient documentation

## 2018-03-17 DIAGNOSIS — D751 Secondary polycythemia: Secondary | ICD-10-CM | POA: Diagnosis not present

## 2018-03-17 DIAGNOSIS — K219 Gastro-esophageal reflux disease without esophagitis: Secondary | ICD-10-CM | POA: Insufficient documentation

## 2018-03-17 DIAGNOSIS — D45 Polycythemia vera: Secondary | ICD-10-CM

## 2018-03-17 DIAGNOSIS — Z7982 Long term (current) use of aspirin: Secondary | ICD-10-CM | POA: Diagnosis not present

## 2018-03-17 DIAGNOSIS — R0902 Hypoxemia: Secondary | ICD-10-CM

## 2018-03-17 DIAGNOSIS — Z85828 Personal history of other malignant neoplasm of skin: Secondary | ICD-10-CM | POA: Insufficient documentation

## 2018-03-17 DIAGNOSIS — D582 Other hemoglobinopathies: Secondary | ICD-10-CM

## 2018-03-17 DIAGNOSIS — I10 Essential (primary) hypertension: Secondary | ICD-10-CM | POA: Insufficient documentation

## 2018-03-17 DIAGNOSIS — E559 Vitamin D deficiency, unspecified: Secondary | ICD-10-CM | POA: Diagnosis not present

## 2018-03-17 DIAGNOSIS — J449 Chronic obstructive pulmonary disease, unspecified: Secondary | ICD-10-CM | POA: Insufficient documentation

## 2018-03-17 DIAGNOSIS — R7303 Prediabetes: Secondary | ICD-10-CM | POA: Diagnosis not present

## 2018-03-17 DIAGNOSIS — J439 Emphysema, unspecified: Secondary | ICD-10-CM

## 2018-03-17 DIAGNOSIS — F17210 Nicotine dependence, cigarettes, uncomplicated: Secondary | ICD-10-CM | POA: Insufficient documentation

## 2018-03-17 LAB — SAVE SMEAR

## 2018-03-17 LAB — CBC WITH DIFFERENTIAL (CANCER CENTER ONLY)
Basophils Absolute: 0 10*3/uL (ref 0.0–0.1)
Basophils Relative: 0 %
Eosinophils Absolute: 0.1 10*3/uL (ref 0.0–0.5)
Eosinophils Relative: 1 %
HCT: 60.1 % — ABNORMAL HIGH (ref 38.7–49.9)
Hemoglobin: 20.2 g/dL — ABNORMAL HIGH (ref 13.0–17.1)
Lymphocytes Relative: 18 %
Lymphs Abs: 1.3 10*3/uL (ref 0.9–3.3)
MCH: 31.5 pg (ref 28.0–33.4)
MCHC: 33.6 g/dL (ref 32.0–35.9)
MCV: 93.6 fL (ref 82.0–98.0)
Monocytes Absolute: 0.8 10*3/uL (ref 0.1–0.9)
Monocytes Relative: 12 %
Neutro Abs: 4.8 10*3/uL (ref 1.5–6.5)
Neutrophils Relative %: 69 %
Platelet Count: 160 10*3/uL (ref 145–400)
RBC: 6.42 MIL/uL — ABNORMAL HIGH (ref 4.20–5.70)
RDW: 13.9 % (ref 11.1–15.7)
WBC Count: 7 10*3/uL (ref 4.0–10.0)

## 2018-03-17 LAB — CMP (CANCER CENTER ONLY)
ALT: 25 U/L (ref 0–44)
AST: 25 U/L (ref 15–41)
Albumin: 4 g/dL (ref 3.5–5.0)
Alkaline Phosphatase: 99 U/L (ref 38–126)
Anion gap: 9 (ref 5–15)
BUN: 15 mg/dL (ref 8–23)
CO2: 31 mmol/L (ref 22–32)
Calcium: 9.7 mg/dL (ref 8.9–10.3)
Chloride: 97 mmol/L — ABNORMAL LOW (ref 98–111)
Creatinine: 0.98 mg/dL (ref 0.61–1.24)
GFR, Est AFR Am: >60 mL/min
GFR, Estimated: >60 mL/min
Glucose, Bld: 120 mg/dL — ABNORMAL HIGH (ref 70–99)
Potassium: 4.3 mmol/L (ref 3.5–5.1)
Sodium: 137 mmol/L (ref 135–145)
Total Bilirubin: 0.5 mg/dL (ref 0.3–1.2)
Total Protein: 7.2 g/dL (ref 6.5–8.1)

## 2018-03-17 LAB — LACTATE DEHYDROGENASE: LDH: 176 U/L (ref 98–192)

## 2018-03-17 NOTE — Addendum Note (Signed)
Addended by: Eliezer Bottom on: 03/17/2018 10:29 PM   Modules accepted: Orders

## 2018-03-17 NOTE — Progress Notes (Addendum)
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 He states that he checks his O2 sat at home and it typically runs 85-86%. He is 82% on room air at this time and is asymptomatic.  He does not have a pulmonologist.  No personal history of thrombus or cardiac event. His mother had history of stroke.  He denies having had any issue with bleeding. He does bruise easily at times. No petechiae noted.  He takes a baby aspirin intermittently.  No personal history of cancer.  No fever, chills, n/v, rash, dizziness, headaches, blurred vision, chest pain, palpitations, abdominal pain or changes in bowel or bladder habits.  No swelling, tenderness, numbness or tingling in his extremities. No c/o pain.  No lymphadenopathy noted on exam.  He has maintained a good appetite and is staying well hydrated. His weight is stable.  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   ROS: All other 10 point review of systems is negative.   PAST MEDICAL HISTORY:   Past Medical History:  Diagnosis Date  . COPD (chronic obstructive pulmonary disease) ([place])   . GERD (gastroesophageal reflux disease)   . History of elevated lipids   . IBS (irritable bowel syndrome)   . Labile hypertension   . Prediabetes   . Vitamin D deficiency     ALLERGIES: Allergies  Allergen Reactions  . Niacin And Related Other (See Comments)      MEDICATIONS:  Current Outpatient Medications on File Prior to Visit  Medication Sig Dispense Refill  . albuterol (PROVENTIL HFA;VENTOLIN HFA) 108 (90 Base) MCG/ACT inhaler Inhale 1 to 2 puffs 4 x day or every 4 hours to rescue  asthma 3 Inhaler 3  . aspirin 81 MG tablet Take 81 mg by mouth daily.    Marland Kitchen b complex vitamins tablet Take 1 tablet by mouth daily.    . Cholecalciferol (VITAMIN D3) 5000 units CAPS Take 10,000 Units by mouth daily.     . diphenhydrAMINE (BENADRYL) 25 MG tablet Take 25 mg by mouth. Take 2 tablets bid    . fluticasone (FLONASE) 50 MCG/ACT nasal spray Place 2 sprays into both nostrils daily. 48 g 3  . formoterol (PERFOROMIST) 20 MCG/2ML nebulizer solution INHALE THE CONTENTS OF 1   VIAL VIA NEBULIZER TWO     TIMES A DAY 360 mL 3  . traZODone (DESYREL) 150 MG tablet Take 1/3 to 1/2 to 1 tablet 1 hour before Sleep 90 tablet 0  . zinc gluconate 50 MG tablet Take 50 mg by mouth daily.    Marland Kitchen guaifenesin (HUMIBID E) 400 MG TABS tablet Take 400 mg by mouth 2 (two) times daily.    Marland Kitchen ipratropium (ATROVENT) 0.06 % nasal spray Use 1 to 2 sprays each nostril 2 to 3 x /day 45 mL 3  . levofloxacin (LEVAQUIN) 500 MG tablet Take 1 tablet daily with food for infection 15 tablet 1  . predniSONE (DELTASONE) 20 MG tablet 1 tab 3 x day for 3 days, then 1 tab 2 x day for 3 days, then 1 tab 1 x day for 5 days 20 tablet 0  No current facility-administered medications on file prior to visit.      PAST SURGICAL HISTORY Past Surgical History:  Procedure Laterality Date  . BASAL CELL CARCINOMA EXCISION  2008   Left [place]  . TESTICLE SURGERY     testicle removed    FAMILY HISTORY: Family History  Problem Relation Age of Onset  . Breast cancer Mother   . Stomach cancer Mother     SOCIAL HISTORY:  reports that he has been smoking cigars.  He has never used smokeless tobacco. He reports that he does not drink alcohol or use drugs.  PERFORMANCE STATUS: The patient's performance status is 1 - Symptomatic but completely ambulatory  PHYSICAL EXAM: Most Recent Vital Signs: Blood pressure (!) 113/58, pulse 93, temperature 98.5 F (36.9 C), temperature source Oral, resp. rate 20, weight 157 lb 1.9 oz (71.3 kg), SpO2  (!) 82 %. BP (!) 113/58 (BP Location: Left Arm, Patient Position: Sitting)   Pulse 93   Temp 98.5 F (36.9 C) (Oral)   Resp 20   Wt 157 lb 1.9 oz (71.3 kg)   SpO2 (!) 82%   BMI 26.15 kg/m   General Appearance:    Alert, cooperative, no distress, appears stated age  Head:    Normocephalic, without obvious abnormality, atraumatic  Eyes:    PERRL, conjunctiva/corneas clear, EOM's intact, fundi    benign, both eyes             Throat:   Lips, mucosa, and tongue normal; teeth and gums normal  Neck:   Supple, symmetrical, trachea midline, no adenopathy;       thyroid:  No enlargement/tenderness/nodules; no carotid   bruit or JVD  Back:     Symmetric, no curvature, ROM normal, no CVA tenderness  Lungs:     Clear to auscultation bilaterally, respirations unlabored  Chest wall:    No tenderness or deformity  Heart:    Regular rate and rhythm, S1 and S2 normal, no murmur, rub   or gallop  Abdomen:     Soft, non-tender, bowel sounds active all four quadrants,    no masses, no organomegaly        Extremities:   Extremities normal, atraumatic, no cyanosis or edema  Pulses:   2+ and symmetric all extremities  Skin:   Skin color, texture, turgor normal, no rashes or lesions  Lymph nodes:   Cervical, supraclavicular, and axillary nodes normal  Neurologic:   CNII-XII intact. Normal strength, sensation and reflexes      throughout    LABORATORY DATA:  Results for orders placed or performed in visit on 03/17/18 (from the past 48 hour(s))  CBC with Differential ([facility] Only)     Status: Abnormal   Collection Time: 03/17/18 12:52 PM  Result Value Ref Range   WBC Count 7.0 4.0 - 10.0 K/uL   RBC 6.42 (H) 4.20 - 5.70 MIL/uL   Hemoglobin 20.2 (H) 13.0 - 17.1 g/dL   HCT 60.1 (H) 38.7 - 49.9 %   MCV 93.6 82.0 - 98.0 fL   MCH 31.5 28.0 - 33.4 pg   MCHC 33.6 32.0 - 35.9 g/dL   RDW 13.9 11.1 - 15.7 %   Platelet Count 160 145 - 400 K/uL   Neutrophils Relative % 69 %   Neutro Abs 4.8 1.5 -  6.5 K/uL   Lymphocytes Relative 18 %   Lymphs Abs 1.3 0.9 - 3.3 K/uL   Monocytes Relative 12 %   Monocytes Absolute 0.8 0.1 -  0.9 K/uL   Eosinophils Relative 1 %   Eosinophils Absolute 0.1 0.0 - 0.5 K/uL   Basophils Relative 0 %   Basophils Absolute 0.0 0.0 - 0.1 K/uL    Comment: Performed at [facility] at [facility], [address], [place], [place] [address]      RADIOGRAPHY: No results found.     PATHOLOGY: None  ASSESSMENT/PLAN: 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 He has not seen pulmonology.  We will get a pulmonary function test on him within the next week and hold off on phlebotomizing him for now. He is asymptomatic at this time, no s/s of distress.  We will go ahead and plan to see him back in another 3 months for follow-up.  We will determine whether or not to phlebotomize him once we have all his test results back.   All questions were answered and he is in agreement with the plan. The patient knows to call the clinic with any problems, questions or concerns. We can certainly see the patient much sooner if necessary.  The patient was discussed with and also seen by Dr. Marin Olp and he is in agreement with the aforementioned.   Laverna Peace    ADDENDUM: I saw and examined Mr. rhoads with Judson Roch.  I agree with her above assessment.  I really think that his erythrocytosis is physiologic.  He has bad COPD.  I am somewhat surprised that he has not had a pulmonary function test done.  It sounds like he does not like to do all that many tests.  I think that if we phlebotomized him, this might impair his oxygen exchange and could cause more difficulties for him.  I think that as long as he continues to smoke, he will have erythrocytosis.  I looked at his peripheral blood smear.  I do not see anything that looked suspicious.  I do not see anything that looks like iron  deficiency.  He is very nice.  I really think that some pulmonary function test will be helpful.  We spent about 40 minutes with him.  All the time was spent face-to-face.  We explained our recommendations.  He was quite happy to hear that he did not need phlebotomizing.  I told him that if he wanted to we could always give blood to the [employer].  I think we should get him back to see [place] in another few months just for follow-up.  Lattie Haw, MD

## 2018-03-18 LAB — IRON AND TIBC
Iron: 81 ug/dL (ref 42–163)
Saturation Ratios: 23 % — ABNORMAL LOW (ref 42–163)
TIBC: 349 ug/dL (ref 202–409)
UIBC: 268 ug/dL

## 2018-03-18 LAB — FERRITIN: Ferritin: 36 ng/mL (ref 24–336)

## 2018-03-24 ENCOUNTER — Ambulatory Visit (HOSPITAL_COMMUNITY)
Admission: RE | Admit: 2018-03-24 | Discharge: 2018-03-24 | Disposition: A | Discharge status: Home / Self Care | Payer: BLUE CROSS/BLUE SHIELD | Source: Ambulatory Visit | Attending: Family | Admitting: Family

## 2018-03-24 DIAGNOSIS — R0902 Hypoxemia: Secondary | ICD-10-CM | POA: Insufficient documentation

## 2018-03-24 DIAGNOSIS — J439 Emphysema, unspecified: Secondary | ICD-10-CM | POA: Insufficient documentation

## 2018-03-24 DIAGNOSIS — D45 Polycythemia vera: Secondary | ICD-10-CM | POA: Insufficient documentation

## 2018-03-24 LAB — PULMONARY FUNCTION TEST
DL/VA % pred: 49 %
DL/VA: 2.13 ml/min/mmHg/L
DLCO cor % pred: 24 %
DLCO cor: 6.28 ml/min/mmHg
DLCO unc % pred: 27 %
DLCO unc: 7.09 ml/min/mmHg
FEF 25-75 Pre: 0.24 L/sec
FEF2575-%Pred-Pre: 10 %
FEV1-%Pred-Pre: 22 %
FEV1-Pre: 0.61 L
FEV1FVC-%Pred-Pre: 48 %
FEV6-%Pred-Pre: 40 %
FEV6-Pre: 1.41 L
FEV6FVC-%Pred-Pre: 87 %
FVC-%Pred-Pre: 45 %
FVC-Pre: 1.72 L
Pre FEV1/FVC ratio: 36 %
Pre FEV6/FVC Ratio: 82 %
RV % pred: 198 %
RV: 4.18 L
TLC % pred: 97 %
TLC: 5.85 L

## 2018-03-31 LAB — JAK 2 V617F (GENPATH)

## 2018-04-02 ENCOUNTER — Other Ambulatory Visit: Payer: Self-pay | Admitting: Family

## 2018-04-02 DIAGNOSIS — D45 Polycythemia vera: Secondary | ICD-10-CM

## 2018-04-06 ENCOUNTER — Telehealth: Payer: Self-pay | Admitting: *Deleted

## 2018-04-06 NOTE — Telephone Encounter (Addendum)
Patient is aware of results. Report sent to PCP.   ----- Message from Volanda Napoleon, MD sent at 04/05/2018  4:54 PM EDT ----- Call - let him know that he does have significant COPD.  Please send the PFT report to his family MD so a referral can be made to pulmonary!!  Laurey Arrow

## 2018-04-12 ENCOUNTER — Telehealth: Payer: Self-pay | Admitting: Hematology & Oncology

## 2018-04-12 NOTE — Telephone Encounter (Signed)
Sw pt to inform of 8/2 sch msg that provider wanted to change appt from 3 month follow up to 6 week follow. Up. Patient stated that he does not feel the need to come earlier and to keep appts in Oct as scheduled

## 2018-05-14 ENCOUNTER — Other Ambulatory Visit: Payer: BLUE CROSS/BLUE SHIELD

## 2018-05-14 ENCOUNTER — Ambulatory Visit: Payer: BLUE CROSS/BLUE SHIELD | Admitting: Family

## 2018-05-17 ENCOUNTER — Other Ambulatory Visit: Payer: Self-pay | Admitting: Internal Medicine

## 2018-05-17 DIAGNOSIS — G4700 Insomnia, unspecified: Secondary | ICD-10-CM

## 2018-05-18 ENCOUNTER — Ambulatory Visit: Payer: BLUE CROSS/BLUE SHIELD | Admitting: Internal Medicine

## 2018-05-18 ENCOUNTER — Encounter: Payer: Self-pay | Admitting: Internal Medicine

## 2018-05-18 VITALS — BP 114/62 | HR 112 | Temp 97.3°F | Resp 18 | Ht 65.0 in | Wt 153.4 lb

## 2018-05-18 DIAGNOSIS — Z79899 Other long term (current) drug therapy: Secondary | ICD-10-CM

## 2018-05-18 DIAGNOSIS — J42 Unspecified chronic bronchitis: Secondary | ICD-10-CM | POA: Diagnosis not present

## 2018-05-18 DIAGNOSIS — J9611 Chronic respiratory failure with hypoxia: Secondary | ICD-10-CM | POA: Diagnosis not present

## 2018-05-18 DIAGNOSIS — D751 Secondary polycythemia: Secondary | ICD-10-CM

## 2018-05-18 LAB — CBC WITH DIFFERENTIAL/PLATELET
Basophils Absolute: 33 cells/uL (ref 0–200)
Basophils Relative: 0.5 %
Eosinophils Absolute: 59 cells/uL (ref 15–500)
Eosinophils Relative: 0.9 %
HCT: 58.8 % — ABNORMAL HIGH (ref 38.5–50.0)
Hemoglobin: 19.9 g/dL — ABNORMAL HIGH (ref 13.2–17.1)
Lymphs Abs: 1164 cells/uL (ref 850–3900)
MCH: 31.1 pg (ref 27.0–33.0)
MCHC: 33.8 g/dL (ref 32.0–36.0)
MCV: 91.9 fL (ref 80.0–100.0)
MPV: 10.1 fL (ref 7.5–12.5)
Monocytes Relative: 14.2 %
Neutro Abs: 4323 cells/uL (ref 1500–7800)
Neutrophils Relative %: 66.5 %
Platelets: 169 10*3/uL (ref 140–400)
RBC: 6.4 10*6/uL — ABNORMAL HIGH (ref 4.20–5.80)
RDW: 12.5 % (ref 11.0–15.0)
Total Lymphocyte: 17.9 %
WBC mixed population: 923 cells/uL (ref 200–950)
WBC: 6.5 10*3/uL (ref 3.8–10.8)

## 2018-05-18 LAB — COMPLETE METABOLIC PANEL WITH GFR
AG Ratio: 1.6 (calc) (ref 1.0–2.5)
ALT: 17 U/L (ref 9–46)
AST: 14 U/L (ref 10–35)
Albumin: 4.3 g/dL (ref 3.6–5.1)
Alkaline phosphatase (APISO): 80 U/L (ref 40–115)
BUN: 18 mg/dL (ref 7–25)
CO2: 38 mmol/L — ABNORMAL HIGH (ref 20–32)
Calcium: 9.8 mg/dL (ref 8.6–10.3)
Chloride: 96 mmol/L — ABNORMAL LOW (ref 98–110)
Creat: 0.83 mg/dL (ref 0.70–1.25)
GFR, Est African American: 106 mL/min/{1.73_m2} (ref >=60)
GFR, Est Non African American: 92 mL/min/{1.73_m2} (ref >=60)
Globulin: 2.7 g/dL (calc) (ref 1.9–3.7)
Glucose, Bld: 105 mg/dL — ABNORMAL HIGH (ref 65–99)
Potassium: 5.4 mmol/L — ABNORMAL HIGH (ref 3.5–5.3)
Sodium: 138 mmol/L (ref 135–146)
Total Bilirubin: 0.5 mg/dL (ref 0.2–1.2)
Total Protein: 7 g/dL (ref 6.1–8.1)

## 2018-05-18 NOTE — Progress Notes (Signed)
   Subjective:    Patient ID: Douglas Barrera, male    DOB: May 19, 1951, 67 y.o.   MRN: [id number]  HPI   Patient is a 67 yo MWM presenting semi -acutely with hx/o of HTN and COPD who has been seeing Dr Marin Olp for secondary polycythemia for phlebotomies. Etiology is felt due to his severe COPD and he had PFT's done by Dr Marin Olp showing FEV 1  of 0.61 at 22% of predicted.      Today's resting O2 on RA is 77% and with ambulation dropped to 70% . On 4.0 Lit/min, he was able to maintain a resting and exercise O2 of 92%.   Medication Sig  . albuterol (PROVENTIL HFA;VENTOLIN HFA) 108 (90 Base) MCG/ACT inhaler Inhale 1 to 2 puffs 4 x day or every 4 hours to rescue asthma  . aspirin 81 MG tablet Take 81 mg by mouth daily.  Marland Kitchen b complex vitamins tablet Take 1 tablet by mouth daily.  . Cholecalciferol (VITAMIN D3) 5000 units CAPS Take 10,000 Units by mouth daily.   . diphenhydrAMINE (BENADRYL) 25 MG tablet Take 25 mg by mouth. Take 2 tablets bid  . fluticasone (FLONASE) 50 MCG/ACT nasal spray Place 2 sprays into both nostrils daily.  . formoterol (PERFOROMIST) 20 MCG/2ML nebulizer solution INHALE THE CONTENTS OF 1   VIAL VIA NEBULIZER TWO     TIMES A DAY  . guaifenesin (HUMIBID E) 400 MG TABS tablet Take 400 mg by mouth 2 (two) times daily.  Marland Kitchen ipratropium (ATROVENT) 0.06 % nasal spray Use 1 to 2 sprays each nostril 2 to 3 x /day  . traZODone (DESYREL) 150 MG tablet TAKE 1/3 TO 1/2 TO 1 TABLET BY MOUTH 1 HOUR BEFORE SLEEP  . zinc gluconate 50 MG tablet Take 50 mg by mouth daily.    Allergies  Allergen Reactions  . Niacin And Related Other (See Comments)   Past Medical History:  Diagnosis Date  . COPD (chronic obstructive pulmonary disease) ([place])   . GERD (gastroesophageal reflux disease)   . History of elevated lipids   . IBS (irritable bowel syndrome)   . Labile hypertension   . Prediabetes   . Vitamin D deficiency     Review of Systems     Objective:   Physical Exam  BP 114/62   Pulse  (!) 112   Temp (!) 97.3 F (36.3 C)   Resp 18   Ht 5\' 5"  (1.651 m)   Wt 153 lb 6.4 oz (69.6 kg)   BMI 25.53 kg/m   HEENT - WNL. Neck - supple.  Chest - Decreased BS with bilat inspiratory rales and expiratory coarse rhonchi and post tussive end terminal wheezes.  Cor - Nl HS. RRR w/o sig MGR. PP 1(+). No edema. MS- FROM w/o deformities.  Gait Nl. Neuro -  Nl w/o focal abnormalities.    Assessment & Plan:   1. Chronic bronchitis, unspecified chronic bronchitis type ([place])  2. Polycythemia secondary to smoking  3. Chronic respiratory failure with hypoxia (HCC)  - CBC & CMET   Start Oxygen at 4 L/M continuous.  Pulm consult

## 2018-05-19 ENCOUNTER — Encounter: Payer: Self-pay | Admitting: Internal Medicine

## 2018-05-19 DIAGNOSIS — I2510 Atherosclerotic heart disease of native coronary artery without angina pectoris: Secondary | ICD-10-CM | POA: Insufficient documentation

## 2018-05-24 ENCOUNTER — Encounter: Payer: Self-pay | Admitting: Internal Medicine

## 2018-05-24 ENCOUNTER — Institutional Professional Consult (permissible substitution): Payer: BLUE CROSS/BLUE SHIELD | Admitting: Pulmonary Disease

## 2018-05-24 ENCOUNTER — Ambulatory Visit: Payer: BLUE CROSS/BLUE SHIELD | Admitting: Internal Medicine

## 2018-05-24 VITALS — BP 96/54 | HR 92 | Temp 97.9°F | Resp 18 | Ht 65.0 in | Wt 152.2 lb

## 2018-05-24 DIAGNOSIS — Z79899 Other long term (current) drug therapy: Secondary | ICD-10-CM | POA: Diagnosis not present

## 2018-05-24 DIAGNOSIS — Z23 Encounter for immunization: Secondary | ICD-10-CM

## 2018-05-24 DIAGNOSIS — J42 Unspecified chronic bronchitis: Secondary | ICD-10-CM

## 2018-05-24 DIAGNOSIS — J3089 Other allergic rhinitis: Secondary | ICD-10-CM

## 2018-05-24 DIAGNOSIS — R0989 Other specified symptoms and signs involving the circulatory and respiratory systems: Secondary | ICD-10-CM

## 2018-05-24 DIAGNOSIS — J9611 Chronic respiratory failure with hypoxia: Secondary | ICD-10-CM

## 2018-05-24 MED ORDER — IPRATROPIUM BROMIDE 0.06 % NA SOLN: NASAL | 3 refills | Status: DC

## 2018-05-24 NOTE — Progress Notes (Signed)
Subjective:    Patient ID: Douglas Barrera, male    DOB: 02/23/1951, 67 y.o.   MRN: [id number]  HPI   This nice 67 yo MWM w/COPD  was seen 1 week ago for routine f/u and also c/o of worsening dyspnea at rest & on exertion and O2 sats were 77% at rest and dropped to 70% w/exertion and on 4 liters O2  His O2 sat leveled out at 92%. Labs showed Hgb was 19.9%  (followed by Dr Marin Olp) and CO2 before O2 was 38 !  He was started on home O2 w/[employer]. And is using 3-4 L/M. He admits a greater sense of well being since on oxygen. Patient did have recent PFT's done by Dr Marin Olp. Further w/u is deferred pending pulmonary consultation scheduled for tomorrow  anticipating CXR & Lung CT scans.      He admits not using his Perforomist Nebs or Albuterol MDI rescue inhalers regularly or frequently.   Medication Sig  . albuterol HFA MDI  Inhale 1 to 2 puffs 4 x day or every 4 hours to rescue asthma  . aspirin 81 MG Take daily.  Marland Kitchen b complex vitamins  Take 1 tablet daily.  Marland Kitchen VITAMIN D 5000 units  Take 10,000 Units  daily.   . diphenhydrAMINE 25 MG  Take 25 mg by mouth. Take 2 tablets bid  . FLONASE nasal spray Place 2 sprays into both nostrils daily.  Marland Kitchen PERFOROMIST 20 MCG neb soln INHALE  1   VIAL VIA NEB 2 x / day  . guaifenesin 400 MG  Take 400 mg by mouth 2 x /daily.  . traZODone  150 MG  TAKE 1/3-1/2-1 TAB 1 HOUR BEFORE SLEEP  . zinc  50 MG tablet Take 50 mg by mouth daily.  Marland Kitchen ipratropium  0.06 % nasal spray Use 1 to 2 sprays each nostril 2 to 3 x /day    Allergies  Allergen Reactions  . Niacin And Related Other (See Comments)    Past Medical History:  Diagnosis Date  . COPD (chronic obstructive pulmonary disease) ([place])   . GERD (gastroesophageal reflux disease)   . History of elevated lipids   . IBS (irritable bowel syndrome)   . Labile hypertension   . Prediabetes   . Vitamin D deficiency    Past Surgical History:  Procedure Laterality Date  . BASAL CELL CARCINOMA EXCISION  2008   Left [place]   . TESTICLE SURGERY     testicle removed      10 point systems review negative except as above.    Objective:   Physical Exam  BP (!) 96/54   Pulse 92   Temp 97.9 F (36.6 C)   Resp 18   Ht 5\' 5"  (1.651 m)   Wt 152 lb 3.2 oz (69 kg)   SpO2 (!) 83% Comment: on 3L/min  BMI 25.33 kg/m   O2 sat repeated at 90%  HEENT - WNL. Neck - supple.  Chest - Very distant BS w/o rales, rhonchi or wheezes. Cor - Nl HS. RRR w/o sig MGR. PP 1(+). No edema. MS- FROM w/o deformities.  Gait Nl. Neuro -  Nl w/o focal abnormalities.    Assessment & Plan:   1. Chronic bronchitis  ([place])  2. Chronic respiratory failure with hypoxia (HCC)  - CBC with Differential/Platelet - COMPLETE METABOLIC PANEL WITH GFR  r/o CO2 retention  3. Labile hypertension  - CBC with Differential/Platelet - COMPLETE METABOLIC PANEL WITH GFR  4. Medication management  -  CBC with Differential/Platelet - COMPLETE METABOLIC PANEL WITH GFR to r/o CO2 retention  5. Need for prophylactic vaccination and inoculation against influenza  - Flu vaccine HIGH DOSE PF (Fluzone High dose)   6. Non-seasonal allergic rhinitis  - ipratropium (ATROVENT) 0.06 % nasal spray; Use 1 to 2 sprays each nostril 2 to 3 x /day  Dispense: 45 mL; Refill: 3

## 2018-05-25 ENCOUNTER — Other Ambulatory Visit: Payer: Self-pay | Admitting: Internal Medicine

## 2018-05-25 ENCOUNTER — Encounter: Payer: Self-pay | Admitting: Pulmonary Disease

## 2018-05-25 ENCOUNTER — Ambulatory Visit (INDEPENDENT_AMBULATORY_CARE_PROVIDER_SITE_OTHER): Payer: BLUE CROSS/BLUE SHIELD | Admitting: Pulmonary Disease

## 2018-05-25 VITALS — BP 110/60 | HR 76 | Ht 63.0 in | Wt 152.0 lb

## 2018-05-25 DIAGNOSIS — J449 Chronic obstructive pulmonary disease, unspecified: Secondary | ICD-10-CM | POA: Diagnosis not present

## 2018-05-25 DIAGNOSIS — D751 Secondary polycythemia: Secondary | ICD-10-CM | POA: Diagnosis not present

## 2018-05-25 DIAGNOSIS — J9611 Chronic respiratory failure with hypoxia: Secondary | ICD-10-CM | POA: Diagnosis not present

## 2018-05-25 DIAGNOSIS — J3089 Other allergic rhinitis: Secondary | ICD-10-CM

## 2018-05-25 HISTORY — DX: Secondary polycythemia: D75.1

## 2018-05-25 LAB — COMPLETE METABOLIC PANEL WITH GFR
AG Ratio: 1.6 (calc) (ref 1.0–2.5)
ALT: 18 U/L (ref 9–46)
AST: 20 U/L (ref 10–35)
Albumin: 4.2 g/dL (ref 3.6–5.1)
Alkaline phosphatase (APISO): 86 U/L (ref 40–115)
BUN: 21 mg/dL (ref 7–25)
CO2: 38 mmol/L — ABNORMAL HIGH (ref 20–32)
Calcium: 10 mg/dL (ref 8.6–10.3)
Chloride: 96 mmol/L — ABNORMAL LOW (ref 98–110)
Creat: 0.82 mg/dL (ref 0.70–1.25)
GFR, Est African American: 107 mL/min/{1.73_m2} (ref >=60)
GFR, Est Non African American: 92 mL/min/{1.73_m2} (ref >=60)
Globulin: 2.6 g/dL (calc) (ref 1.9–3.7)
Glucose, Bld: 86 mg/dL (ref 65–99)
Potassium: 4.9 mmol/L (ref 3.5–5.3)
Sodium: 140 mmol/L (ref 135–146)
Total Bilirubin: 0.7 mg/dL (ref 0.2–1.2)
Total Protein: 6.8 g/dL (ref 6.1–8.1)

## 2018-05-25 LAB — CBC WITH DIFFERENTIAL/PLATELET
Basophils Absolute: 38 cells/uL (ref 0–200)
Basophils Relative: 0.6 %
Eosinophils Absolute: 109 cells/uL (ref 15–500)
Eosinophils Relative: 1.7 %
HCT: 57 % — ABNORMAL HIGH (ref 38.5–50.0)
Hemoglobin: 19.6 g/dL — ABNORMAL HIGH (ref 13.2–17.1)
Lymphs Abs: 1248 cells/uL (ref 850–3900)
MCH: 31.4 pg (ref 27.0–33.0)
MCHC: 34.4 g/dL (ref 32.0–36.0)
MCV: 91.2 fL (ref 80.0–100.0)
MPV: 10.7 fL (ref 7.5–12.5)
Monocytes Relative: 17.6 %
Neutro Abs: 3878 cells/uL (ref 1500–7800)
Neutrophils Relative %: 60.6 %
Platelets: 132 10*3/uL — ABNORMAL LOW (ref 140–400)
RBC: 6.25 10*6/uL — ABNORMAL HIGH (ref 4.20–5.80)
RDW: 12.6 % (ref 11.0–15.0)
Total Lymphocyte: 19.5 %
WBC mixed population: 1126 cells/uL — ABNORMAL HIGH (ref 200–950)
WBC: 6.4 10*3/uL (ref 3.8–10.8)

## 2018-05-25 MED ORDER — REVEFENACIN 175 MCG/3ML IN SOLN: 3.0000 mL | Freq: Every day | RESPIRATORY_TRACT | 11 refills | Status: DC

## 2018-05-25 MED ORDER — IPRATROPIUM BROMIDE 0.06 % NA SOLN: NASAL | 3 refills | Status: DC

## 2018-05-25 NOTE — Addendum Note (Signed)
Addended by: Vivia Ewing on: 05/25/2018 02:55 PM   Modules accepted: Orders

## 2018-05-25 NOTE — Patient Instructions (Addendum)
Recommend enrollment in lung cancer screening clinic. Continued smoking cessation.  Will start yupelri once daily  We try to get POC o2 delivery  Will refer to pulmonary rehab  Full PFTs, ABG, and 6MWT to on return to clinic  RTC in 3 months

## 2018-05-25 NOTE — Progress Notes (Signed)
Synopsis: Referred in September 2019 for severe COPD by [name], MD  Subjective:   PATIENT ID: Douglas Barrera GENDER: male DOB: Mar 19, 1951, MRN: [id number]  Chief Complaint  Patient presents with  . Consult    COPD for several years, recently start oxygen 4L continous. States since starting oxygen he has not had any SOB. Developed a cough  years ago with mucous but improved since starting oxygen. PFT 03/24/2018.     He was diagnosed with COPD about 3-4 years ago. He quit smoking a week ago after being placed on oxygen. He feels much better after being placed on O2. He has much more energy after start O2. He has never been hospitalized for an exacerbation. Smoked for 40 years, 1 ppd.  Patient denies weight loss, fevers, chills, night sweats.  He does have sputum production.  He is able to complete most of his activities of daily living.  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  He used to travel around to local bakery's as well as a crossed many states to teach institutions and bakeries how to bake certain tasks.  He is able to go to the grocery store, pushes on buggy as well as mow his grass while riding a lawnmower.   Past Medical History:  Diagnosis Date  . COPD (chronic obstructive pulmonary disease) ([place])   . GERD (gastroesophageal reflux disease)   . History of elevated lipids   . IBS (irritable bowel syndrome)   . Labile hypertension   . Prediabetes   . Vitamin D deficiency      Family History  Problem Relation Age of Onset  . Breast cancer Mother   . Stomach cancer Mother      Past Surgical History:  Procedure Laterality Date  . BASAL CELL CARCINOMA EXCISION  2008   Left [place]  . TESTICLE SURGERY     testicle removed    Social History   Socioeconomic History  . Marital status: Married    Spouse name: Not on file  . Number of children: Not on file  . Years of education: Not on file  . Highest education level: Not on file  Occupational History  . Not on file  Social  Needs  . Financial resource strain: Not on file  . Food insecurity:    Worry: Not on file    Inability: Not on file  . Transportation needs:    Medical: Not on file    Non-medical: Not on file  Tobacco Use  . Smoking status: Current Every Day Smoker    Types: Cigars  . Smokeless tobacco: Never Used  . Tobacco comment: quit smoking cigarettes, but smokes 3-4 cigars a week.  Substance and Sexual Activity  . Alcohol use: No    Alcohol/week: 0.0 standard drinks  . Drug use: No  . Sexual activity: Not on file  Lifestyle  . Physical activity:    Days per week: Not on file    Minutes per session: Not on file  . Stress: Not on file  Relationships  . Social connections:    Talks on phone: Not on file    Gets together: Not on file    Attends religious service: Not on file    Active member of club or organization: Not on file    Attends meetings of clubs or organizations: Not on file    Relationship status: Not on file  . Intimate partner violence:    Fear of current or ex partner: Not on file  Emotionally abused: Not on file    Physically abused: Not on file    Forced sexual activity: Not on file  Other Topics Concern  . Not on file  Social History Narrative  . Not on file     Allergies  Allergen Reactions  . Niacin And Related Other (See Comments)     Outpatient Medications Prior to Visit  Medication Sig Dispense Refill  . albuterol (PROVENTIL HFA;VENTOLIN HFA) 108 (90 Base) MCG/ACT inhaler Inhale 1 to 2 puffs 4 x day or every 4 hours to rescue asthma 3 Inhaler 3  . aspirin 81 MG tablet Take 81 mg by mouth daily.    Marland Kitchen b complex vitamins tablet Take 1 tablet by mouth daily.    . Cholecalciferol (VITAMIN D3) 5000 units CAPS Take 10,000 Units by mouth daily.     . diphenhydrAMINE (BENADRYL) 25 MG tablet Take 25 mg by mouth. Take 2 tablets bid    . fluticasone (FLONASE) 50 MCG/ACT nasal spray Place 2 sprays into both nostrils daily. 48 g 3  . formoterol (PERFOROMIST) 20  MCG/2ML nebulizer solution INHALE THE CONTENTS OF 1   VIAL VIA NEBULIZER TWO     TIMES A DAY 360 mL 3  . guaifenesin (HUMIBID E) 400 MG TABS tablet Take 400 mg by mouth 2 (two) times daily.    Marland Kitchen ipratropium (ATROVENT) 0.06 % nasal spray Use 1 to 2 sprays each nostril 2 to 3 x /day 45 mL 3  . traZODone (DESYREL) 150 MG tablet TAKE 1/3 TO 1/2 TO 1 TABLET BY MOUTH 1 HOUR BEFORE SLEEP 90 tablet 0  . zinc gluconate 50 MG tablet Take 50 mg by mouth daily.     No facility-administered medications prior to visit.     Review of Systems  Constitutional: Negative.   HENT: Positive for congestion and sore throat.   Eyes: Negative.   Respiratory: Positive for cough, sputum production and shortness of breath.   Cardiovascular: Negative.   Gastrointestinal: Negative.   Genitourinary: Negative.   Musculoskeletal: Negative.   Skin: Negative.   Neurological: Positive for headaches.  Endo/Heme/Allergies: Positive for environmental allergies.  Psychiatric/Behavioral: Negative.      Objective:  Physical Exam  Constitutional: He is oriented to person, place, and time. He appears well-developed and well-nourished. No distress.  HENT:  Head: Normocephalic and atraumatic.  Mouth/Throat: Oropharynx is clear and moist.  Eyes: Pupils are equal, round, and reactive to light. Conjunctivae are normal. No scleral icterus.  Neck: Neck supple. No JVD present. No tracheal deviation present.  Cardiovascular: Normal rate, regular rhythm, normal heart sounds and intact distal pulses.  No murmur heard. Pulmonary/Chest: Effort normal. No accessory muscle usage or stridor. No tachypnea. No respiratory distress. He has no wheezes. He has no rhonchi. He has no rales.  Diminished breath sounds bilaterally, no wheeze  Abdominal: Soft. He exhibits no distension. There is no tenderness.  Musculoskeletal: He exhibits no edema or tenderness.  Lymphadenopathy:    He has no cervical adenopathy.  Neurological: He is alert and  oriented to person, place, and time.  Skin: Skin is warm and dry. Capillary refill takes less than 2 seconds. No rash noted.  Psychiatric: He has a normal mood and affect. His behavior is normal.  Vitals reviewed.    Vitals:   05/25/18 1332  BP: 110/60  Pulse: 76  SpO2: 96%  Weight: 152 lb (68.9 kg)  Height: 5\' 3"  (1.6 m)   96% on 4 L nasal cannula BMI Readings  from Last 3 Encounters:  05/25/18 26.93 kg/m  05/24/18 25.33 kg/m  05/18/18 25.53 kg/m   Wt Readings from Last 3 Encounters:  05/25/18 152 lb (68.9 kg)  05/24/18 152 lb 3.2 oz (69 kg)  05/18/18 153 lb 6.4 oz (69.6 kg)     CBC    Component Value Date/Time   WBC 6.4 05/24/2018 1446   RBC 6.25 (H) 05/24/2018 1446   HGB 19.6 (H) 05/24/2018 1446   HGB 20.2 (H) 03/17/2018 1252   HCT 57.0 (H) 05/24/2018 1446   PLT 132 (L) 05/24/2018 1446   PLT 160 03/17/2018 1252   MCV 91.2 05/24/2018 1446   MCH 31.4 05/24/2018 1446   MCHC 34.4 05/24/2018 1446   RDW 12.6 05/24/2018 1446   LYMPHSABS 1,248 05/24/2018 1446   MONOABS 0.8 03/17/2018 1252   EOSABS 109 05/24/2018 1446   BASOSABS 38 05/24/2018 1446    Chest Imaging: 08/26/2017 CT chest lung cancer screening, LD CT Lung-RADS 1 radiology, recommending annual continued screening. Evidence of mild bronchial thickening, scattered areas of groundglass, areas of scarring. The patient's images have been independently reviewed by me.    Pulmonary Functions Testing Results: 03/24/2018 FVC 1.72 L, 45% predicted FEV1 0.61 L, 22% predicted Ratio 36 TLC 97% predicted DLCO 27% predicted  FeNO: None   Pathology: None   Echocardiogram: None   Heart Catheterization: None     Assessment & Plan:   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HCC) - FEV1 22% Predicted  - Plan: 6 minute walk, AMB referral to pulmonary rehabilitation, Ambulatory Referral for DME, Pulmonary function test, Ambulatory Referral for Lung Cancer Scre, CANCELED: Pulmonary function test  Secondary polycythemia  Chronic  hypoxemic respiratory failure (HCC)  Elevated serum bicarbonate, this is likely from renal compensation for a chronic respiratory acidosis.  Discussion:  This is a pleasant 67 year old male with very severe COPD FEV1 22% predicted.  He does have an elevated serum bicarbonate which is likely renal compensation for chronic respiratory acidosis.  We do not have a arterial blood gas to confirm this however due to the severity of his lung disease this would be expected.  He also has chronic hypoxemic respiratory failure currently requiring 4 L nasal cannula continuous.  Due to the patient's severity of lung disease we discussed futures of either symptom management alone or potential referral for lung transplantation evaluation as he is under the age of 72.  However he at minimum needs to be smoke-free for the next 6 months and show active participation in pulmonary rehabilitation prior to referral at this point. He has quit smoking for the past week   Plan:  We will plan to refer patient to pulmonary rehabilitation.  Will send for enrollment in our lung cancer screening program.  He will need a repeat low-dose lung cancer screening to be done in December 2019.  We will start patient on nebulized LAMA in addition he should continue his current nebulized formoterol and as needed albuterol.  We will start paperwork for POC. We will obtain arterial blood gas to confirm presence of likely a chronic renal decompensated respiratory acidosis.  Return to clinic in 3 months    Current Outpatient Medications:  .  albuterol (PROVENTIL HFA;VENTOLIN HFA) 108 (90 Base) MCG/ACT inhaler, Inhale 1 to 2 puffs 4 x day or every 4 hours to rescue asthma, Disp: 3 Inhaler, Rfl: 3 .  aspirin 81 MG tablet, Take 81 mg by mouth daily., Disp: , Rfl:  .  b complex vitamins tablet, Take 1 tablet  by mouth daily., Disp: , Rfl:  .  Cholecalciferol (VITAMIN D3) 5000 units CAPS, Take 10,000 Units by mouth daily. , Disp: , Rfl:    .  diphenhydrAMINE (BENADRYL) 25 MG tablet, Take 25 mg by mouth. Take 2 tablets bid, Disp: , Rfl:  .  fluticasone (FLONASE) 50 MCG/ACT nasal spray, Place 2 sprays into both nostrils daily., Disp: 48 g, Rfl: 3 .  formoterol (PERFOROMIST) 20 MCG/2ML nebulizer solution, INHALE THE CONTENTS OF 1   VIAL VIA NEBULIZER TWO     TIMES A DAY, Disp: 360 mL, Rfl: 3 .  guaifenesin (HUMIBID E) 400 MG TABS tablet, Take 400 mg by mouth 2 (two) times daily., Disp: , Rfl:  .  ipratropium (ATROVENT) 0.06 % nasal spray, Use 1 to 2 sprays each nostril 2 to 3 x /day, Disp: 45 mL, Rfl: 3 .  traZODone (DESYREL) 150 MG tablet, TAKE 1/3 TO 1/2 TO 1 TABLET BY MOUTH 1 HOUR BEFORE SLEEP, Disp: 90 tablet, Rfl: 0 .  zinc gluconate 50 MG tablet, Take 50 mg by mouth daily., Disp: , Rfl:    Garner Nash, DO [facility] 05/25/2018 2:25 PM

## 2018-05-31 ENCOUNTER — Ambulatory Visit (HOSPITAL_COMMUNITY)
Admission: RE | Admit: 2018-05-31 | Discharge: 2018-05-31 | Disposition: A | Discharge status: Home / Self Care | Payer: BLUE CROSS/BLUE SHIELD | Source: Ambulatory Visit | Attending: Pulmonary Disease | Admitting: Pulmonary Disease

## 2018-05-31 DIAGNOSIS — J449 Chronic obstructive pulmonary disease, unspecified: Secondary | ICD-10-CM | POA: Insufficient documentation

## 2018-05-31 LAB — BLOOD GAS, ARTERIAL
Acid-Base Excess: 5.3 mmol/L — ABNORMAL HIGH (ref 0.0–2.0)
Bicarbonate: 31.2 mmol/L — ABNORMAL HIGH (ref 20.0–28.0)
Drawn by: 270211
O2 Content: 4 L/min
O2 Saturation: 91.2 %
Patient temperature: 98.6
pCO2 arterial: 50.6 mmHg — ABNORMAL HIGH (ref 32.0–48.0)
pH, Arterial: 7.407 (ref 7.350–7.450)
pO2, Arterial: 61.4 mmHg — ABNORMAL LOW (ref 83.0–108.0)

## 2018-05-31 LAB — PULMONARY FUNCTION TEST
DL/VA % pred: 52 %
DL/VA: 2.22 ml/min/mmHg/L
DLCO cor % pred: 31 %
DLCO cor: 7.96 ml/min/mmHg
DLCO unc % pred: 34 %
DLCO unc: 8.83 ml/min/mmHg
FEF 25-75 Post: 0.31 L/sec
FEF 25-75 Pre: 0.34 L/sec
FEF2575-%Change-Post: -9 %
FEF2575-%Pred-Post: 13 %
FEF2575-%Pred-Pre: 15 %
FEV1-%Change-Post: -22 %
FEV1-%Pred-Post: 23 %
FEV1-%Pred-Pre: 30 %
FEV1-Post: 0.65 L
FEV1-Pre: 0.84 L
FEV1FVC-%Change-Post: -25 %
FEV1FVC-%Pred-Pre: 62 %
FEV6-%Change-Post: 3 %
FEV6-%Pred-Post: 49 %
FEV6-%Pred-Pre: 47 %
FEV6-Post: 1.75 L
FEV6-Pre: 1.69 L
FEV6FVC-%Change-Post: 0 %
FEV6FVC-%Pred-Post: 98 %
FEV6FVC-%Pred-Pre: 98 %
FVC-%Change-Post: 3 %
FVC-%Pred-Post: 50 %
FVC-%Pred-Pre: 48 %
FVC-Post: 1.89 L
FVC-Pre: 1.83 L
Post FEV1/FVC ratio: 34 %
Post FEV6/FVC ratio: 93 %
Pre FEV1/FVC ratio: 46 %
Pre FEV6/FVC Ratio: 92 %

## 2018-05-31 MED ORDER — ALBUTEROL SULFATE (2.5 MG/3ML) 0.083% IN NEBU
2.5000 mg | INHALATION_SOLUTION | Freq: Once | RESPIRATORY_TRACT | Status: AC
  Administered 2018-05-31: 2.5 mg via RESPIRATORY_TRACT

## 2018-06-01 LAB — CBC WITH DIFFERENTIAL/PLATELET
Basophils Absolute: 50 cells/uL (ref 0–200)
Basophils Relative: 0.5 %
Eosinophils Absolute: 40 cells/uL (ref 15–500)
Eosinophils Relative: 0.4 %
HCT: 61.3 % — ABNORMAL HIGH (ref 38.5–50.0)
Hemoglobin: 21.4 g/dL — ABNORMAL HIGH (ref 13.2–17.1)
Lymphs Abs: 1160 cells/uL (ref 850–3900)
MCH: 31.4 pg (ref 27.0–33.0)
MCHC: 34.9 g/dL (ref 32.0–36.0)
MCV: 90 fL (ref 80.0–100.0)
MPV: 10.2 fL (ref 7.5–12.5)
Monocytes Relative: 11.8 %
Neutro Abs: 7570 cells/uL (ref 1500–7800)
Neutrophils Relative %: 75.7 %
Platelets: 155 10*3/uL (ref 140–400)
RBC: 6.81 10*6/uL — ABNORMAL HIGH (ref 4.20–5.80)
RDW: 11.8 % (ref 11.0–15.0)
Total Lymphocyte: 11.6 %
WBC mixed population: 1180 cells/uL — ABNORMAL HIGH (ref 200–950)
WBC: 10 10*3/uL (ref 3.8–10.8)

## 2018-06-01 LAB — MAGNESIUM: Magnesium: 1.9 mg/dL (ref 1.5–2.5)

## 2018-06-01 LAB — COMPLETE METABOLIC PANEL WITH GFR
AG Ratio: 1.6 (calc) (ref 1.0–2.5)
ALT: 23 U/L (ref 9–46)
AST: 21 U/L (ref 10–35)
Albumin: 4.6 g/dL (ref 3.6–5.1)
Alkaline phosphatase (APISO): 92 U/L (ref 40–115)
BUN: 16 mg/dL (ref 7–25)
CO2: 32 mmol/L (ref 20–32)
Calcium: 10 mg/dL (ref 8.6–10.3)
Chloride: 93 mmol/L — ABNORMAL LOW (ref 98–110)
Creat: 0.95 mg/dL (ref 0.70–1.25)
GFR, Est African American: 96 mL/min/{1.73_m2} (ref >=60)
GFR, Est Non African American: 83 mL/min/{1.73_m2} (ref >=60)
Globulin: 2.9 g/dL (calc) (ref 1.9–3.7)
Glucose, Bld: 104 mg/dL — ABNORMAL HIGH (ref 65–99)
Potassium: 4.9 mmol/L (ref 3.5–5.3)
Sodium: 134 mmol/L — ABNORMAL LOW (ref 135–146)
Total Bilirubin: 0.9 mg/dL (ref 0.2–1.2)
Total Protein: 7.5 g/dL (ref 6.1–8.1)

## 2018-06-01 LAB — LIPID PANEL
Cholesterol: 144 mg/dL (ref <200)
HDL: 42 mg/dL (ref >40)
LDL Cholesterol (Calc): 85 mg/dL (calc)
Non-HDL Cholesterol (Calc): 102 mg/dL (calc) (ref <130)
Total CHOL/HDL Ratio: 3.4 (calc) (ref <5.0)
Triglycerides: 82 mg/dL (ref <150)

## 2018-06-01 LAB — HEMOGLOBIN A1C
Hgb A1c MFr Bld: 5.9 % of total Hgb — ABNORMAL HIGH (ref <5.7)
Mean Plasma Glucose: 123 (calc)
eAG (mmol/L): 6.8 (calc)

## 2018-06-01 LAB — TSH: TSH: 2.47 mIU/L (ref 0.40–4.50)

## 2018-06-01 LAB — VITAMIN D 25 HYDROXY (VIT D DEFICIENCY, FRACTURES): Vit D, 25-Hydroxy: 83 ng/mL (ref 30–100)

## 2018-06-01 LAB — INSULIN, RANDOM: Insulin: 16.8 u[IU]/mL (ref 2.0–19.6)

## 2018-06-02 ENCOUNTER — Telehealth (HOSPITAL_COMMUNITY): Payer: Self-pay

## 2018-06-02 ENCOUNTER — Telehealth: Payer: Self-pay

## 2018-06-02 NOTE — Telephone Encounter (Signed)
Called and spoke with patient in regards to Pulmonary Rehab and insurance - patient stated he is not interested in Rehab at this time. Closed referral.

## 2018-06-02 NOTE — Telephone Encounter (Signed)
-----   Message from Garner Nash, DO sent at 06/02/2018 11:28 AM EDT ----- No he doesn't need a PFT in December. Thanks Leory Plowman   ----- Message ----- From: Vivia Ewing, LPN Sent: 32/0/2334   8:38 AM EDT To: Octavio Graves Icard, DO  Good Morning,   I spoke with Douglas Barrera this morning, he is aware of results. He is also scheduled to have a PFT in December as well. Do you want him to repeat his PFT in December? I think whoever set up his follow up appt may have mis-understood your AVS.  ----- Message ----- From: Garner Nash, DO Sent: 06/01/2018   4:51 PM EDT To: Vivia Ewing, LPN  [place], you can let him know his PFTs confirm the diagnosis of COPD. We will discuss this more at his next office visit. His ABG is abnormal but this is expected for his lung disease. Thanks Leory Plowman

## 2018-06-02 NOTE — Telephone Encounter (Signed)
Called and informed patient he does not need a repeat PFT in December and that his appoint for his PFT has been cancelled. Nothing further needed at this time.

## 2018-06-06 NOTE — Patient Instructions (Addendum)
Home Oxygen Use, Adult    When a medical condition keeps you from getting enough oxygen, your health care provider may instruct you to take extra oxygen at home. Your health care provider will let you know:   When to take oxygen.   For how long to take oxygen.   How quickly oxygen should be delivered (flow rate), in liters per minute (LPM or L/M).    Home oxygen can be given through:   A mask.   A nasal cannula. This is a device or tube that goes in the nostrils.   A transtracheal catheter. This is a small, flexible tube placed in the trachea.   A tracheostomy. This is a surgically made opening in the trachea.    These devices are connected with tubing to an oxygen source, such as:   A tank. Tanks hold oxygen in gas form. They must be replaced when the oxygen is used up.   A liquid oxygen device. This holds oxygen in liquid form. It must be replaced when the oxygen is used up.   An oxygen concentrator machine. This filters oxygen in the room. It uses electricity, so you must have a backup cylinder of oxygen in case the power goes out.    Supplies needed:  To use oxygen, you will need:   A mask, nasal cannula, transtracheal catheter, or tracheostomy.   An oxygen tank, a liquid oxygen device, or an oxygen concentrator.   The tape that your health care provider recommends (optional).    If you use a transtracheal catheter and your prescribed flow rate is 1 LPM or greater, you will also need a humidifier.  Risks and complications   Fire. This can happen if the oxygen is exposed to a heat source, flame, or spark.   Injury to skin. This can happen if liquid oxygen touches your skin.   Organ damage. This can happen if you get too little oxygen.  How to use oxygen    Your health care provider will show you how to use your oxygen device. Follow her or his instructions. They may look something like this:  1. Wash your hands.  2. If you use an oxygen concentrator, make sure it is plugged in.  3. Place one  end of the tube into the port on the tank, device, or machine.  4. Place the mask over your nose and mouth. Or, place the nasal cannula and secure it with tape if instructed. If you use a tracheostomy or transtracheal catheter, connect it to the oxygen source as directed.  5. Make sure the liter-flow setting on the machine is at the level prescribed by your health care provider.  6. Turn on the machine or adjust the knob on the tank or device to the correct liter-flow setting.  7. When you are done, turn off and unplug the machine, or turn the knob to OFF.    How to clean and care for the oxygen supplies  Nasal cannula   Clean it with a warm, wet cloth daily or as needed.   Wash it with a liquid soap once a week.   Rinse it thoroughly once or twice a week.   Replace it every 2-4 weeks.   If you have an infection, such as a cold or pneumonia, change the cannula when you get better.  Mask   Replace it every 2-4 weeks.   If you have an infection, such as a cold or pneumonia, change the mask when   least twice a week according to directions from your home medical equipment and service company.  Wipe down the cabinet every day. To do this: ? Unplug the unit. ? Wipe down the cabinet with a damp cloth. ? Dry the cabinet. Other equipment  Change any extra tubing every 1-3 months.  Follow instructions from your health care provider about taking care of any other equipment. Safety tips Fire safety tips   Keep your oxygen and oxygen supplies at least 5 ft away from sources of heat, flames, and sparks at all times.  Do not allow smoking near your oxygen. Put up "no smoking" signs in your home.  Do not use materials that can burn (are  flammable) while you use oxygen.  When you go to a restaurant with portable oxygen, ask to be seated in the nonsmoking section.  Keep a Data processing manager close by. Let your fire department know that you have oxygen in your home.  Test your home smoke detectors regularly. General safety tips  If you use an oxygen cylinder, make sure it is in a stand or secured to an object that will not move (fixed object).  If you use liquid oxygen, make sure its container is kept upright.  If you use an oxygen concentrator: ? Dance movement psychotherapist company. Make sure you are given priority service in the event that your power goes out. ? Avoid using extension cords, if possible. Follow these instructions at home:  Use oxygen only as told by your health care provider.  Do not use alcohol or other drugs that make you relax (sedating drugs) unless instructed. They can slow down your breathing rate and make it hard to get in enough oxygen.  Know how and when to order a refill of oxygen.  Always keep a spare tank of oxygen. Plan ahead for holidays when you may not be able to get a prescription filled.  Use water-based lubricants on your lips or nostrils. Do not use oil-based products like petroleum jelly.  To prevent skin irritation on your cheeks or behind your ears, tuck some gauze under the tubing. Contact a health care provider if:  You get headaches often.  You have shortness of breath.  You have a lasting cough.  You have anxiety.  You are sleepy all the time.  You develop an illness that affects your breathing.  You cannot exercise at your regular level.  You are restless.  You have difficult or irregular breathing, and it is getting worse.  You have a fever.  You have persistent redness under your nose. Get help right away if:  You are confused.  You have blue lips or fingernails.  You are struggling to breathe. +++++++++++++++++++++++++++++++++  Hypoxia Hypoxia is a  condition that happens when there is a lack of oxygen in the body's tissues and organs. When there is not enough oxygen, organs cannot work as they should. This causes serious problems throughout the body and in the brain. What are the causes? This condition may be caused by:  Exposure to high altitude.  A collapsed lung (pneumothorax).  Lung infection (pneumonia).  Lung injury.  Long-term (chronic) lung disease, such as COPD (chronic obstructive pulmonary disease).  Blood collecting in the chest cavity (hemothorax).  Food, saliva, or vomit getting into the airway (aspiration).  Reduced blood flow (ischemia).  Severe blood loss.  Slow or shallow breathing (hypoventilation).  Blood disorders, such as anemia.  Carbon monoxide poisoning.  The heart suddenly stopping (cardiac arrest).  Anesthetic medicines.  Drowning.  Choking.  What are the signs or symptoms? Symptoms of this condition include:  Headache.  Fatigue.  Drowsiness.  Forgetfulness.  Nausea.  Confusion.  Shortness of breath.  Dizziness.  Bluish color of the skin, lips, or nail beds (cyanosis).  Change in consciousness or awareness.  If hypoxia is not treated, it can lead to convulsions, loss of consciousness (coma), or brain damage. How is this diagnosed? This condition may be diagnosed based on:  A physical exam.  Blood tests.  A test that measures how much oxygen is in your blood (pulse oximetry). This is done with a sensor that is placed on your finger, toe, or earlobe.  Chest X-ray.  Tests to check your lung function (pulmonary function tests).  A test to check the electrical activity of your heart (electrocardiogram, ECG).  You may have other tests to determine the cause of your hypoxia. How is this treated? Treatment for this condition depends on what is causing the hypoxia. You will likely be treated with oxygen therapy. This may be done by giving you oxygen through a face  mask or through tubes in your nose. Your health care provider may also recommend other therapies to treat the underlying cause of your hypoxia. Follow these instructions at home:  Take over-the-counter and prescription medicines only as told by your health care provider.  Do not use any products that contain nicotine or tobacco, such as cigarettes and e-cigarettes. If you need help quitting, ask your health care provider.  Avoid secondhand smoke.  Work with your health care provider to manage any chronic conditions you have that may be causing hypoxia, such as COPD.  Keep all follow-up visits as told by your health care provider. This is important. Contact a health care provider if:  You have a fever.  You have trouble breathing, even after treatment.  You become extremely short of breath when you exercise. Get help right away if:  Your shortness of breath gets worse, especially with normal or very little activity.  Your skin, lips, or nail beds have a bluish color.  You become confused or you cannot think properly.  You have chest pain. Summary  Hypoxia is a condition that happens when there is a lack of oxygen in the body's tissues and organs.  If hypoxia is not treated, it can lead to convulsions, loss of consciousness (coma), or brain damage.  Symptoms of hypoxia can include a headache, shortness of breath, confusion, nausea, and a bluish skin color.  Hypoxia has many possible causes, including exposure to high altitude, carbon monoxide poisoning, or other health issues, such as blood disorders or cardiac arrest.  Hypoxia is usually treated with oxygen therapy.

## 2018-06-06 NOTE — Progress Notes (Signed)
  Subjective:    Patient ID: Douglas Barrera, male    DOB: 15-Oct-1950, 67 y.o.   MRN: [id number]  HPI    This very nice 67 yo MWM  W/COPD was recently begun on Oxygen for severe hypoxia and has required moderate flow rates to achieve acceptable O2 saturations.  Reports O2 sat's off of oxygen usually about 85 and drop to the low 80's with walking. He has been seen in consultation by Pulmonary Dr Valeta Harms. CO2 's have been drifting up since on O2 from CO2 of 31-32 range to lastly 38. Patient reports feeling much better since on O2. No recent cough or congestion. Denies CP.  Medication Sig  . albuterol  HFA inhaler Inhale 1 to 2 puffs 4 x day or every 4 hours to rescue asthma  . aspirin 81 MG tablet Take daily.  Marland Kitchen b complex vitamins tablet Take 1 tablet  daily.  Marland Kitchen VITAMIN D 5000 units CAPS Take 10,000 Units  daily.   . diphenhydrAMINE  25 MG tablet Take 25 mg by mouth. Take 2 tablets bid  . FLONASE nasal spray Place 2 sprays into both nostrils daily.  Marland Kitchen PERFOROMIST 20 MCG neb soln INHALE  1 VIAL VIA NEB 2 x/ DAY  . guaifenesin  400 MG TABS Take 400 mg by mouth 2 (two) times daily.  . ATROVENT 0.06 % nasal spray Use 1 to 2 sprays each nostril 2 to 3 x /day  . Revefenacin (YUPELRI) 175 MCG SOLN Inhale 3 mLs into the lungs daily.  . traZODone  150 MG tablet TAKE 1/3-1/2-1 TAB 1 HR BEFORE SLEEP  . zinc  50 MG tablet Take  daily.   Allergies  Allergen Reactions  . Niacin And Related Other (See Comments)   Past Medical History:  Diagnosis Date  . COPD (chronic obstructive pulmonary disease) ([place])   . GERD (gastroesophageal reflux disease)   . History of elevated lipids   . IBS (irritable bowel syndrome)   . Labile hypertension   . Prediabetes   . Vitamin D deficiency    Review of Systems    10 point systems review negative except as above.    Objective:   Physical Exam  BP  113/79   P 92   T97.3 F    Resp 18   Ht 5\' 5"     Wt 151 lb 3.2 oz    SpO2 91%  On 4 Lit/min  BMI 25.16  In no  distress on nasal O2. No rash cyanosis .   HEENT - WNL. Neck - supple.  Chest - scattered rales & rhonchi clear w/cough Cor - Nl HS. RRR w/o sig MGR. PP 1(+). No edema. MS- FROM w/o deformities.  Gait Nl. Neuro -  Nl w/o focal abnormalities.    Assessment & Plan:   1. Chronic bronchitis ([place])  - CBC with Differential/Platelet - COMPLETE METABOLIC PANEL WITH GFR  2. Chronic respiratory failure with hypoxia (HCC)  - CBC with Differential/Platelet - COMPLETE METABOLIC PANEL WITH GFR  3. Labile hypertension  - CBC with Differential/Platelet - COMPLETE METABOLIC PANEL WITH GFR  4. Polycythemia secondary to smoking  - CBC with Differential/Platelet  5. Medication management  - CBC with Differential/Platelet - COMPLETE METABOLIC PANEL WITH GFR   .

## 2018-06-07 ENCOUNTER — Encounter: Payer: Self-pay | Admitting: Internal Medicine

## 2018-06-07 ENCOUNTER — Ambulatory Visit: Payer: BLUE CROSS/BLUE SHIELD | Admitting: Internal Medicine

## 2018-06-07 VITALS — BP 113/79 | HR 90 | Temp 97.3°F | Resp 18 | Ht 65.0 in | Wt 151.2 lb

## 2018-06-07 DIAGNOSIS — R0989 Other specified symptoms and signs involving the circulatory and respiratory systems: Secondary | ICD-10-CM

## 2018-06-07 DIAGNOSIS — J42 Unspecified chronic bronchitis: Secondary | ICD-10-CM

## 2018-06-07 DIAGNOSIS — D751 Secondary polycythemia: Secondary | ICD-10-CM | POA: Diagnosis not present

## 2018-06-07 DIAGNOSIS — J9611 Chronic respiratory failure with hypoxia: Secondary | ICD-10-CM

## 2018-06-07 DIAGNOSIS — Z79899 Other long term (current) drug therapy: Secondary | ICD-10-CM

## 2018-06-08 LAB — CBC WITH DIFFERENTIAL/PLATELET
Basophils Absolute: 33 cells/uL (ref 0–200)
Basophils Relative: 0.5 %
Eosinophils Absolute: 59 cells/uL (ref 15–500)
Eosinophils Relative: 0.9 %
HCT: 56.6 % — ABNORMAL HIGH (ref 38.5–50.0)
Hemoglobin: 19.5 g/dL — ABNORMAL HIGH (ref 13.2–17.1)
Lymphs Abs: 1053 cells/uL (ref 850–3900)
MCH: 31.2 pg (ref 27.0–33.0)
MCHC: 34.5 g/dL (ref 32.0–36.0)
MCV: 90.6 fL (ref 80.0–100.0)
MPV: 9.9 fL (ref 7.5–12.5)
Monocytes Relative: 15 %
Neutro Abs: 4381 cells/uL (ref 1500–7800)
Neutrophils Relative %: 67.4 %
Platelets: 199 10*3/uL (ref 140–400)
RBC: 6.25 10*6/uL — ABNORMAL HIGH (ref 4.20–5.80)
RDW: 12.1 % (ref 11.0–15.0)
Total Lymphocyte: 16.2 %
WBC mixed population: 975 cells/uL — ABNORMAL HIGH (ref 200–950)
WBC: 6.5 10*3/uL (ref 3.8–10.8)

## 2018-06-08 LAB — COMPLETE METABOLIC PANEL WITH GFR
AG Ratio: 1.5 (calc) (ref 1.0–2.5)
ALT: 26 U/L (ref 9–46)
AST: 25 U/L (ref 10–35)
Albumin: 4.4 g/dL (ref 3.6–5.1)
Alkaline phosphatase (APISO): 91 U/L (ref 40–115)
BUN: 17 mg/dL (ref 7–25)
CO2: 33 mmol/L — ABNORMAL HIGH (ref 20–32)
Calcium: 9.9 mg/dL (ref 8.6–10.3)
Chloride: 95 mmol/L — ABNORMAL LOW (ref 98–110)
Creat: 0.93 mg/dL (ref 0.70–1.25)
GFR, Est African American: 99 mL/min/{1.73_m2} (ref >=60)
GFR, Est Non African American: 85 mL/min/{1.73_m2} (ref >=60)
Globulin: 2.9 g/dL (calc) (ref 1.9–3.7)
Glucose, Bld: 90 mg/dL (ref 65–99)
Potassium: 5.1 mmol/L (ref 3.5–5.3)
Sodium: 136 mmol/L (ref 135–146)
Total Bilirubin: 0.8 mg/dL (ref 0.2–1.2)
Total Protein: 7.3 g/dL (ref 6.1–8.1)

## 2018-06-09 ENCOUNTER — Telehealth: Payer: Self-pay | Admitting: Pulmonary Disease

## 2018-06-09 NOTE — Telephone Encounter (Signed)
Spoke with patient-states he only has Medicare Part A and can not go through local DME for medications. He has picked up Rx at local [facility] and will continue to get medication through them. Will send closed encounter to B. Icard as FYI.

## 2018-06-09 NOTE — Telephone Encounter (Signed)
Ok great thank you.  BLI

## 2018-06-09 NOTE — Telephone Encounter (Signed)
Called [facility] to check cost of copay for Yupelri-co-pay is $45. Spoke with Ashly at [facility] patient could most likely get Rx for $0 since he has double coverage and a RT goes to the patient and goes over the medication and teaching for nebulizer,etc.    Icard please advise if you are okay with [place] reviewing this with patient and send Rx to [facility]. Thanks.

## 2018-06-09 NOTE — Telephone Encounter (Signed)
Yes, ok to send to [facility]. We can also contact the yupelri rep if need to help US obtain the drug. Thanks [place], DO [facility] 06/09/2018 2:51 PM

## 2018-06-10 ENCOUNTER — Telehealth: Payer: Self-pay | Admitting: Internal Medicine

## 2018-06-10 NOTE — Telephone Encounter (Signed)
Spoke to patient regarding oxygen componets. Patient would like to speak to [place] regarding Intogen G-5, POC units, pendlum accessories for mobility, and servicing. Riccardo Dubin from [place] to call his cell phone. Patient to call our office with conversation outcome.

## 2018-06-14 ENCOUNTER — Telehealth: Payer: Self-pay | Admitting: Internal Medicine

## 2018-06-14 NOTE — Telephone Encounter (Signed)
[facility] requested Portable Oxygen Concentrator Titration order, to qualify for POC. Per Dr Melford Aase, order was faxed to [facility]

## 2018-06-16 ENCOUNTER — Telehealth: Payer: Self-pay | Admitting: Internal Medicine

## 2018-06-16 ENCOUNTER — Telehealth: Payer: Self-pay | Admitting: Pulmonary Disease

## 2018-06-16 NOTE — Telephone Encounter (Signed)
Patient requested POC Titration Order sent fo [facility]. Patient has exisiting releationship w/ [facility]. Spoke w/ Marguerite Olea and faxed order to [facility] for resp tech to evaluate tolerance to POC vs cylinders.  Also, Patient was prescribed Yupelri by Dr Valeta Harms. He states he is  taking this medicine.

## 2018-06-16 NOTE — Telephone Encounter (Signed)
Called and spoke to Douglas Barrera at patient's PCP office. She stated that she had also called the home health company and was told that with patient requiring 4 L of continuous oxygen that he doesn't currently qualify for a POC and that if he gets down to 3L he could be evaluated.  She asked if we could re-evaluate for POC. Let her know that patient has an appointment in two months and could possibly be re qualified then but we don't have POC to evaluate with and the best fit evaluation is usually done with home health retail store. Douglas Barrera stated that she will follow back up with the patient and let him know. Nothing further needed at this time.

## 2018-06-17 ENCOUNTER — Inpatient Hospital Stay (HOSPITAL_BASED_OUTPATIENT_CLINIC_OR_DEPARTMENT_OTHER): Payer: BLUE CROSS/BLUE SHIELD | Admitting: Family

## 2018-06-17 ENCOUNTER — Encounter: Payer: Self-pay | Admitting: Family

## 2018-06-17 ENCOUNTER — Inpatient Hospital Stay: Payer: BLUE CROSS/BLUE SHIELD | Attending: Family

## 2018-06-17 ENCOUNTER — Ambulatory Visit: Payer: BLUE CROSS/BLUE SHIELD | Admitting: Family

## 2018-06-17 ENCOUNTER — Other Ambulatory Visit: Payer: BLUE CROSS/BLUE SHIELD

## 2018-06-17 ENCOUNTER — Other Ambulatory Visit: Payer: Self-pay

## 2018-06-17 VITALS — BP 126/81 | HR 93 | Temp 97.9°F | Resp 18 | Wt 148.8 lb

## 2018-06-17 DIAGNOSIS — J449 Chronic obstructive pulmonary disease, unspecified: Secondary | ICD-10-CM | POA: Insufficient documentation

## 2018-06-17 DIAGNOSIS — Z7982 Long term (current) use of aspirin: Secondary | ICD-10-CM | POA: Diagnosis not present

## 2018-06-17 DIAGNOSIS — Z9981 Dependence on supplemental oxygen: Secondary | ICD-10-CM | POA: Diagnosis not present

## 2018-06-17 DIAGNOSIS — F17210 Nicotine dependence, cigarettes, uncomplicated: Secondary | ICD-10-CM | POA: Diagnosis not present

## 2018-06-17 DIAGNOSIS — R0902 Hypoxemia: Secondary | ICD-10-CM

## 2018-06-17 DIAGNOSIS — D751 Secondary polycythemia: Secondary | ICD-10-CM | POA: Diagnosis not present

## 2018-06-17 DIAGNOSIS — J439 Emphysema, unspecified: Secondary | ICD-10-CM

## 2018-06-17 DIAGNOSIS — D45 Polycythemia vera: Secondary | ICD-10-CM

## 2018-06-17 LAB — CMP (CANCER CENTER ONLY)
ALT: 25 U/L (ref 0–44)
AST: 26 U/L (ref 15–41)
Albumin: 3.6 g/dL (ref 3.5–5.0)
Alkaline Phosphatase: 93 U/L (ref 38–126)
Anion gap: 11 (ref 5–15)
BUN: 17 mg/dL (ref 8–23)
CO2: 32 mmol/L (ref 22–32)
Calcium: 10.4 mg/dL — ABNORMAL HIGH (ref 8.9–10.3)
Chloride: 98 mmol/L (ref 98–111)
Creatinine: 0.81 mg/dL (ref 0.61–1.24)
GFR, Est AFR Am: >60 mL/min (ref >60)
GFR, Estimated: >60 mL/min (ref >60)
Glucose, Bld: 104 mg/dL — ABNORMAL HIGH (ref 70–99)
Potassium: 5.6 mmol/L — ABNORMAL HIGH (ref 3.5–5.1)
Sodium: 141 mmol/L (ref 135–145)
Total Bilirubin: 0.7 mg/dL (ref 0.3–1.2)
Total Protein: 7.4 g/dL (ref 6.5–8.1)

## 2018-06-17 LAB — CBC WITH DIFFERENTIAL (CANCER CENTER ONLY)
Abs Immature Granulocytes: 0.02 10*3/uL (ref 0.00–0.07)
Basophils Absolute: 0 10*3/uL (ref 0.0–0.1)
Basophils Relative: 0 %
Eosinophils Absolute: 0.1 10*3/uL (ref 0.0–0.5)
Eosinophils Relative: 1 %
HCT: 56.9 % — ABNORMAL HIGH (ref 39.0–52.0)
Hemoglobin: 18.6 g/dL — ABNORMAL HIGH (ref 13.0–17.0)
Immature Granulocytes: 0 %
Lymphocytes Relative: 18 %
Lymphs Abs: 1.3 10*3/uL (ref 0.7–4.0)
MCH: 30.4 pg (ref 26.0–34.0)
MCHC: 32.7 g/dL (ref 30.0–36.0)
MCV: 93 fL (ref 80.0–100.0)
Monocytes Absolute: 1.2 10*3/uL — ABNORMAL HIGH (ref 0.1–1.0)
Monocytes Relative: 17 %
Neutro Abs: 4.6 10*3/uL (ref 1.7–7.7)
Neutrophils Relative %: 64 %
Platelet Count: 196 10*3/uL (ref 150–400)
RBC: 6.12 MIL/uL — ABNORMAL HIGH (ref 4.22–5.81)
RDW: 12.1 % (ref 11.5–15.5)
WBC Count: 7.2 10*3/uL (ref 4.0–10.5)
nRBC: 0 % (ref 0.0–0.2)

## 2018-06-17 NOTE — Progress Notes (Signed)
Hematology and Oncology Follow Up Visit  ZANNIE RUNKLE [id number] 11-07-1950 67 y.o. 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 Overbeck is here today for follow-up. He is doing well now that he is on 4L supplemental O2 24 hours a day. His SOB is stable. He states that he feels much better. He is followed closely by pulmonology.   He is still smoking but states that he turns off his O2 and goes outside. He has almost completely stopped. He verbalized understanding of the dangers of lighting up a cigarette near oxygen and that it could cause severe injury and even death.  His Hct is high but stable at 56.9. He is taking his baby aspirin daily as prescribed.  No bleeding, bruising or petechiae.  No fever, chills, n/v, cough, rash, dizziness, chest pain, palpitations, abdominal pain or changes in bowel or bladder habits.  No swelling, tenderness numbness or tingling in her extremities. No c/o pain.  No lymphadenopathy noted on exam.  He has been eating well and staying hydrated. His weight is stable.   ECOG Performance Status: 1 - Symptomatic but completely ambulatory  Medications:  Allergies as of 06/17/2018      Reactions   Niacin And Related Other (See Comments)      Medication List        Accurate as of 06/17/18  1:26 PM. Always use your most recent med list.          albuterol 108 (90 Base) MCG/ACT inhaler Commonly known as:  PROVENTIL HFA;VENTOLIN HFA Inhale 1 to 2 puffs 4 x day or every 4 hours to rescue asthma   aspirin 81 MG tablet Take 81 mg by mouth daily.   b complex vitamins tablet Take 1 tablet by mouth daily.   diphenhydrAMINE 25 MG tablet Commonly known as:  BENADRYL Take 25 mg by mouth. Take 2 tablets bid   fluticasone 50 MCG/ACT nasal spray Commonly known as:  FLONASE Place 2 sprays into both nostrils daily.   formoterol 20 MCG/2ML nebulizer  solution Commonly known as:  PERFOROMIST INHALE THE CONTENTS OF 1   VIAL VIA NEBULIZER TWO     TIMES A DAY   guaifenesin 400 MG Tabs tablet Commonly known as:  HUMIBID E Take 400 mg by mouth 2 (two) times daily.   ipratropium 0.06 % nasal spray Commonly known as:  ATROVENT Use 1 to 2 sprays each nostril 2 to 3 x /day   Revefenacin 175 MCG/3ML Soln Inhale 3 mLs into the lungs daily.   traZODone 150 MG tablet Commonly known as:  DESYREL TAKE 1/3 TO 1/2 TO 1 TABLET BY MOUTH 1 HOUR BEFORE SLEEP   Vitamin D3 5000 units Caps Take 10,000 Units by mouth daily.   zinc gluconate 50 MG tablet Take 50 mg by mouth daily.       Allergies:  Allergies  Allergen Reactions  . Niacin And Related Other (See Comments)    Past Medical History, Surgical history, Social history, and Family History were reviewed and updated.  Review of Systems: All other 10 point review of systems is negative.   Physical Exam:  weight is 148 lb 12 oz (67.5 kg). His oral temperature is 97.9 F (36.6 C). His blood pressure is 126/81 and his pulse is 93. His respiration is 18 and oxygen saturation is 100%.   Wt Readings from Last 3 Encounters:  06/17/18 148 lb 12 oz (67.5 kg)  06/07/18 151 lb 3.2 oz (68.6 kg)  05/25/18 152 lb (68.9 kg)    Ocular: Sclerae unicteric, pupils equal, round and reactive to light Ear-nose-throat: Oropharynx clear, dentition fair Lymphatic: No cervical, supraclavicular or axillary adenopathy Lungs no rales or rhonchi, good excursion bilaterally Heart regular rate and rhythm, no murmur appreciated Abd soft, nontender, positive bowel sounds, no liver or spleen tip palpated on exam, no fluid wave  MSK no focal spinal tenderness, no joint edema Neuro: non-focal, well-oriented, appropriate affect Breasts: Deferred   Lab Results  Component Value Date   WBC 7.2 06/17/2018   HGB 18.6 (H) 06/17/2018   HCT 56.9 (H) 06/17/2018   MCV 93.0 06/17/2018   PLT 196 06/17/2018   Lab  Results  Component Value Date   FERRITIN 36 03/17/2018   IRON 81 03/17/2018   TIBC 349 03/17/2018   UIBC 268 03/17/2018   IRONPCTSAT 23 (L) 03/17/2018   Lab Results  Component Value Date   RBC 6.12 (H) 06/17/2018   No results found for: KPAFRELGTCHN, LAMBDASER, KAPLAMBRATIO No results found for: IGGSERUM, IGA, IGMSERUM No results found for: Kathrynn Ducking, MSPIKE, SPEI   Chemistry      Component Value Date/Time   NA 136 06/07/2018 1000   K 5.1 06/07/2018 1000   CL 95 (L) 06/07/2018 1000   CO2 33 (H) 06/07/2018 1000   BUN 17 06/07/2018 1000   CREATININE 0.93 06/07/2018 1000      Component Value Date/Time   CALCIUM 9.9 06/07/2018 1000   ALKPHOS 99 03/17/2018 1252   AST 25 06/07/2018 1000   AST 25 03/17/2018 1252   ALT 26 06/07/2018 1000   ALT 25 03/17/2018 1252   BILITOT 0.8 06/07/2018 1000   BILITOT 0.5 03/17/2018 1252      Impression and Plan: Mr. Baig is a very pleasant 67 yo caucasian gentleman with secondary polycythemia due to COPD and smoking. He is now on 4L [place] supplemental O2 24 hours a day and doing well. 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  At this point, we will not phlebotomize him. We will continue to follow along and monitor his counts.  He will take his baby aspirin daily.  We will plan to see him back in another 2-3 months.  He will contact our office with any questions or concerns. We can certainly see him sooner if need be.   Laverna Peace, NP 10/17/20191:26 PM

## 2018-06-25 ENCOUNTER — Telehealth: Payer: Self-pay | Admitting: Pulmonary Disease

## 2018-06-25 NOTE — Telephone Encounter (Signed)
Called [facility] and spoke with Joellen Jersey, they carry G3 POC's which only go up to 3L pulsed, patient requires 4L. Called patient, patient states his oxygen is going to high on 4L, says his other physician told him they did not want his oxygen higher than 93% so he cut his oxygen down to 3L to keep his oxygen at 93%. Informed patient without a face to face visit to re-qualify him for oxygen. Appt made. Nothing further is needed at this time.

## 2018-07-06 ENCOUNTER — Telehealth: Payer: Self-pay | Admitting: Pulmonary Disease

## 2018-07-06 NOTE — Telephone Encounter (Signed)
It appears that pt was given reminder call of our location for 07/07/18 OV.  I have made pt aware that we have not switched offices as of yet. Nothing further is needed.

## 2018-07-07 ENCOUNTER — Ambulatory Visit (INDEPENDENT_AMBULATORY_CARE_PROVIDER_SITE_OTHER): Payer: BLUE CROSS/BLUE SHIELD | Admitting: Pulmonary Disease

## 2018-07-07 ENCOUNTER — Encounter: Payer: Self-pay | Admitting: Pulmonary Disease

## 2018-07-07 VITALS — BP 118/78 | HR 83 | Ht 65.0 in | Wt 151.0 lb

## 2018-07-07 DIAGNOSIS — J449 Chronic obstructive pulmonary disease, unspecified: Secondary | ICD-10-CM | POA: Diagnosis not present

## 2018-07-07 DIAGNOSIS — D751 Secondary polycythemia: Secondary | ICD-10-CM | POA: Diagnosis not present

## 2018-07-07 DIAGNOSIS — J9611 Chronic respiratory failure with hypoxia: Secondary | ICD-10-CM | POA: Diagnosis not present

## 2018-07-07 NOTE — Patient Instructions (Addendum)
Thank you for visiting Dr. Valeta Harms at [facility]. Today we recommend the following:  Walk today for O2 needs. Will arrange getting a POC.  Continue current nebulized medications.   Return in about 3 months (around 10/07/2018), or if symptoms worsen or fail to improve.  We are moving our office in November. The new address will be: [address] [address] Phone: [phone]

## 2018-07-07 NOTE — Progress Notes (Signed)
Synopsis: Referred in September 2019 for severe COPD by [name], MD  Subjective:   PATIENT ID: Douglas Barrera GENDER: male DOB: Sep 17, 1950, MRN: [id number]  Chief Complaint  Patient presents with  . Follow-up    Needs to be requalified for oxygen.     He was diagnosed with COPD about 3-4 years ago. He quit smoking a week ago after being placed on oxygen. He feels much better after being placed on O2. He has much more energy after start O2. He has never been hospitalized for an exacerbation. Smoked for 40 years, 1 ppd.  Patient denies weight loss, fevers, chills, night sweats.  He does have sputum production.  He is able to complete most of his activities of daily living.  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  He used to travel around to local bakery's as well as a crossed many states to teach institutions and bakeries how to bake certain tasks.  He is able to go to the grocery store, pushes on buggy as well as mow his grass while riding a lawnmower.  OV 07/07/2018: Patient been doing well since last office visit.  He is trying to obtain POC.  Insurance has required requalification for O2 needs.  Presents today for walking in the office for that.  He has been using his new nebulized medications.  He is not really sure he seen much difference but he does think that his functional status is good.  Today he plans to go home and mow the grass.  He is able to use right More.  He is able to go to the grocery store.  He still is able to work in his kitchen and bake.  Overall doing well.   Past Medical History:  Diagnosis Date  . COPD (chronic obstructive pulmonary disease) ([place])   . GERD (gastroesophageal reflux disease)   . History of elevated lipids   . IBS (irritable bowel syndrome)   . Labile hypertension   . Prediabetes   . Vitamin D deficiency      Family History  Problem Relation Age of Onset  . Breast cancer Mother   . Stomach cancer Mother      Past Surgical History:  Procedure  Laterality Date  . BASAL CELL CARCINOMA EXCISION  2008   Left Anand  . TESTICLE SURGERY     testicle removed    Social History   Socioeconomic History  . Marital status: Married    Spouse name: Not on file  . Number of children: Not on file  . Years of education: Not on file  . Highest education level: Not on file  Occupational History  . Not on file  Social Needs  . Financial resource strain: Not on file  . Food insecurity:    Worry: Not on file    Inability: Not on file  . Transportation needs:    Medical: Not on file    Non-medical: Not on file  Tobacco Use  . Smoking status: Current Every Day Smoker    Types: Cigars  . Smokeless tobacco: Never Used  . Tobacco comment: quit smoking cigarettes, but smokes 3-4 cigars a week.  Substance and Sexual Activity  . Alcohol use: No    Alcohol/week: 0.0 standard drinks  . Drug use: No  . Sexual activity: Not on file  Lifestyle  . Physical activity:    Days per week: Not on file    Minutes per session: Not on file  . Stress: Not on  file  Relationships  . Social connections:    Talks on phone: Not on file    Gets together: Not on file    Attends religious service: Not on file    Active member of club or organization: Not on file    Attends meetings of clubs or organizations: Not on file    Relationship status: Not on file  . Intimate partner violence:    Fear of current or ex partner: Not on file    Emotionally abused: Not on file    Physically abused: Not on file    Forced sexual activity: Not on file  Other Topics Concern  . Not on file  Social History Narrative  . Not on file     Allergies  Allergen Reactions  . Niacin And Related Other (See Comments)     Outpatient Medications Prior to Visit  Medication Sig Dispense Refill  . albuterol (PROVENTIL HFA;VENTOLIN HFA) 108 (90 Base) MCG/ACT inhaler Inhale 1 to 2 puffs 4 x day or every 4 hours to rescue asthma 3 Inhaler 3  . aspirin 81 MG tablet Take 81 mg by  mouth daily.    Marland Kitchen b complex vitamins tablet Take 1 tablet by mouth daily.    . Cholecalciferol (VITAMIN D3) 5000 units CAPS Take 10,000 Units by mouth daily.     . diphenhydrAMINE (BENADRYL) 25 MG tablet Take 25 mg by mouth. Take 2 tablets bid    . fluticasone (FLONASE) 50 MCG/ACT nasal spray Place 2 sprays into both nostrils daily. 48 g 3  . formoterol (PERFOROMIST) 20 MCG/2ML nebulizer solution INHALE THE CONTENTS OF 1   VIAL VIA NEBULIZER TWO     TIMES A DAY 360 mL 3  . guaifenesin (HUMIBID E) 400 MG TABS tablet Take 400 mg by mouth 2 (two) times daily.    Marland Kitchen ipratropium (ATROVENT) 0.06 % nasal spray Use 1 to 2 sprays each nostril 2 to 3 x /day 45 mL 3  . Revefenacin (YUPELRI) 175 MCG/3ML SOLN Inhale 3 mLs into the lungs daily. 90 mL 11  . traZODone (DESYREL) 150 MG tablet TAKE 1/3 TO 1/2 TO 1 TABLET BY MOUTH 1 HOUR BEFORE SLEEP 90 tablet 0  . zinc gluconate 50 MG tablet Take 50 mg by mouth daily.     No facility-administered medications prior to visit.     Review of Systems  Constitutional: Negative for chills, fever, malaise/fatigue and weight loss.  HENT: Negative for hearing loss, sore throat and tinnitus.   Eyes: Negative for blurred vision and double vision.  Respiratory: Positive for shortness of breath. Negative for cough, hemoptysis, sputum production, wheezing and stridor.   Cardiovascular: Negative for chest pain, palpitations, orthopnea, leg swelling and PND.  Gastrointestinal: Negative for abdominal pain, constipation, diarrhea, heartburn, nausea and vomiting.  Genitourinary: Negative for dysuria, hematuria and urgency.  Musculoskeletal: Negative for joint pain and myalgias.  Skin: Negative for itching and rash.  Neurological: Negative for dizziness, tingling, weakness and headaches.  Endo/Heme/Allergies: Negative for environmental allergies. Does not bruise/bleed easily.  Psychiatric/Behavioral: Negative for depression. The patient is not nervous/anxious and does not have  insomnia.   All other systems reviewed and are negative.    Objective:  Physical Exam  Constitutional: He is oriented to person, place, and time. He appears well-developed and well-nourished. No distress.  HENT:  Head: Normocephalic and atraumatic.  Mouth/Throat: Oropharynx is clear and moist.  Occasional pursed lip breathing.  Eyes: Pupils are equal, round, and reactive to light. Conjunctivae  are normal. No scleral icterus.  Neck: Neck supple. No JVD present. No tracheal deviation present.  Cardiovascular: Normal rate, regular rhythm, normal heart sounds and intact distal pulses.  No murmur heard. Pulmonary/Chest: Effort normal. No accessory muscle usage or stridor. No tachypnea. No respiratory distress. He has no wheezes. He has no rhonchi. He has no rales.  Diminished breath sounds bilaterally, increased anterior posterior diameter of the chest  Abdominal: Soft. Bowel sounds are normal. He exhibits no distension. There is no tenderness.  Musculoskeletal: He exhibits no edema or tenderness.  Lymphadenopathy:    He has no cervical adenopathy.  Neurological: He is alert and oriented to person, place, and time.  Skin: Skin is warm and dry. Capillary refill takes less than 2 seconds. No rash noted.  Psychiatric: He has a normal mood and affect. His behavior is normal.  Vitals reviewed.    Vitals:   07/07/18 1015  BP: 118/78  Pulse: 83  SpO2: 93%  Weight: 151 lb (68.5 kg)  Height: 5\' 5"  (1.651 m)   93% on 4 L nasal cannula BMI Readings from Last 3 Encounters:  07/07/18 25.13 kg/m  06/17/18 24.75 kg/m  06/07/18 25.16 kg/m   Wt Readings from Last 3 Encounters:  07/07/18 151 lb (68.5 kg)  06/17/18 148 lb 12 oz (67.5 kg)  06/07/18 151 lb 3.2 oz (68.6 kg)     CBC    Component Value Date/Time   WBC 7.2 06/17/2018 1301   WBC 6.5 06/07/2018 1000   RBC 6.12 (H) 06/17/2018 1301   HGB 18.6 (H) 06/17/2018 1301   HCT 56.9 (H) 06/17/2018 1301   PLT 196 06/17/2018 1301    MCV 93.0 06/17/2018 1301   MCH 30.4 06/17/2018 1301   MCHC 32.7 06/17/2018 1301   RDW 12.1 06/17/2018 1301   LYMPHSABS 1.3 06/17/2018 1301   MONOABS 1.2 (H) 06/17/2018 1301   EOSABS 0.1 06/17/2018 1301   BASOSABS 0.0 06/17/2018 1301    Chest Imaging: 08/26/2017 CT chest lung cancer screening, LD CT Lung-RADS 1 radiology, recommending annual continued screening. Evidence of mild bronchial thickening, scattered areas of groundglass, areas of scarring. The patient's images have been independently reviewed by me.    Pulmonary Functions Testing Results: 03/24/2018 FVC 1.72 L, 45% predicted FEV1 0.61 L, 22% predicted Ratio 36 TLC 97% predicted DLCO 27% predicted  FeNO: None   Pathology: None   Echocardiogram: None   Heart Catheterization: None     Assessment & Plan:   Chronic hypoxemic respiratory failure (HCC)  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[place])  Secondary polycythemia  Discussion: This is a 67 year old with very severe COPD, FEV1 22% predicted.  With chronic respiratory failure requiring nasal cannula O2 supplementation, secondary polycythemia, compensated chronic respiratory acidosis.  Overall good functional status with relatively low mMRC 1.  Patient has declined pulmonary rehabilitation at this time.  Thankfully has been smoke-free for now greater than 1 month.  Plan:  Patient severity of COPD would likely preclude lung cancer screening. Continue nebulized lama/laba. Continue albuterol as needed nebulized. We will walk today in clinic for oxygen needs. We will help patient obtain POC. Return to clinic in 3 months or if symptoms worsen.    Current Outpatient Medications:  .  albuterol (PROVENTIL HFA;VENTOLIN HFA) 108 (90 Base) MCG/ACT inhaler, Inhale 1 to 2 puffs 4 x day or every 4 hours to rescue asthma, Disp: 3 Inhaler, Rfl: 3 .  aspirin 81 MG tablet, Take 81 mg by mouth daily., Disp: , Rfl:  .  b complex vitamins tablet, Take 1 tablet by mouth daily., Disp: , Rfl:  .   Cholecalciferol (VITAMIN D3) 5000 units CAPS, Take 10,000 Units by mouth daily. , Disp: , Rfl:  .  diphenhydrAMINE (BENADRYL) 25 MG tablet, Take 25 mg by mouth. Take 2 tablets bid, Disp: , Rfl:  .  fluticasone (FLONASE) 50 MCG/ACT nasal spray, Place 2 sprays into both nostrils daily., Disp: 48 g, Rfl: 3 .  formoterol (PERFOROMIST) 20 MCG/2ML nebulizer solution, INHALE THE CONTENTS OF 1   VIAL VIA NEBULIZER TWO     TIMES A DAY, Disp: 360 mL, Rfl: 3 .  guaifenesin (HUMIBID E) 400 MG TABS tablet, Take 400 mg by mouth 2 (two) times daily., Disp: , Rfl:  .  ipratropium (ATROVENT) 0.06 % nasal spray, Use 1 to 2 sprays each nostril 2 to 3 x /day, Disp: 45 mL, Rfl: 3 .  Revefenacin (YUPELRI) 175 MCG/3ML SOLN, Inhale 3 mLs into the lungs daily., Disp: 90 mL, Rfl: 11 .  traZODone (DESYREL) 150 MG tablet, TAKE 1/3 TO 1/2 TO 1 TABLET BY MOUTH 1 HOUR BEFORE SLEEP, Disp: 90 tablet, Rfl: 0 .  zinc gluconate 50 MG tablet, Take 50 mg by mouth daily., Disp: , Rfl:    Garner Nash, DO [facility] 07/07/2018 10:46 AM

## 2018-07-15 ENCOUNTER — Telehealth: Payer: Self-pay | Admitting: Pulmonary Disease

## 2018-07-15 NOTE — Telephone Encounter (Signed)
Called and spoke to [place], per [facility] patient will not qualify for POC needing 4L continous. Also placed a call to [facility] and [facility]. Called and informed patient that he will not qualify for a POC at this time. Patient states "this means I will have to go buy one on my own." I informed this patient that we would not recommend that as he requires 4L to keep his pxygen saturations above 88%. Patient states "I do not need it to walk around, I only need it to use in the car. I have no intentions to use it while up walking around. I want to be able to go the beach and use it in the car and only in the car because my tanks will not last long enough". I informed patient there was no way for [place] to qualify him for a POC to use only while driving. Patient voiced understanding and states he will just buy one on his own. Nothing further is needed at this time.

## 2018-08-16 ENCOUNTER — Ambulatory Visit: Payer: BLUE CROSS/BLUE SHIELD

## 2018-08-16 ENCOUNTER — Ambulatory Visit: Payer: BLUE CROSS/BLUE SHIELD | Admitting: Pulmonary Disease

## 2018-08-19 ENCOUNTER — Inpatient Hospital Stay: Payer: BLUE CROSS/BLUE SHIELD | Attending: Family | Admitting: Family

## 2018-08-19 ENCOUNTER — Inpatient Hospital Stay: Payer: BLUE CROSS/BLUE SHIELD

## 2018-08-19 VITALS — BP 99/58 | HR 91 | Temp 97.8°F | Resp 18 | Ht 65.0 in | Wt 152.0 lb

## 2018-08-19 DIAGNOSIS — Z7982 Long term (current) use of aspirin: Secondary | ICD-10-CM | POA: Diagnosis not present

## 2018-08-19 DIAGNOSIS — D751 Secondary polycythemia: Secondary | ICD-10-CM | POA: Diagnosis present

## 2018-08-19 DIAGNOSIS — Z79899 Other long term (current) drug therapy: Secondary | ICD-10-CM | POA: Diagnosis not present

## 2018-08-19 DIAGNOSIS — J449 Chronic obstructive pulmonary disease, unspecified: Secondary | ICD-10-CM | POA: Diagnosis not present

## 2018-08-19 DIAGNOSIS — F17210 Nicotine dependence, cigarettes, uncomplicated: Secondary | ICD-10-CM | POA: Insufficient documentation

## 2018-08-19 LAB — CBC WITH DIFFERENTIAL (CANCER CENTER ONLY)
Abs Immature Granulocytes: 0.04 10*3/uL (ref 0.00–0.07)
Basophils Absolute: 0 10*3/uL (ref 0.0–0.1)
Basophils Relative: 0 %
Eosinophils Absolute: 0.1 10*3/uL (ref 0.0–0.5)
Eosinophils Relative: 1 %
HCT: 50.5 % (ref 39.0–52.0)
Hemoglobin: 16.8 g/dL (ref 13.0–17.0)
Immature Granulocytes: 1 %
Lymphocytes Relative: 22 %
Lymphs Abs: 1.7 10*3/uL (ref 0.7–4.0)
MCH: 31.2 pg (ref 26.0–34.0)
MCHC: 33.3 g/dL (ref 30.0–36.0)
MCV: 93.7 fL (ref 80.0–100.0)
Monocytes Absolute: 1.3 10*3/uL — ABNORMAL HIGH (ref 0.1–1.0)
Monocytes Relative: 17 %
Neutro Abs: 4.5 10*3/uL (ref 1.7–7.7)
Neutrophils Relative %: 59 %
Platelet Count: 194 10*3/uL (ref 150–400)
RBC: 5.39 MIL/uL (ref 4.22–5.81)
RDW: 12.3 % (ref 11.5–15.5)
WBC Count: 7.5 10*3/uL (ref 4.0–10.5)
nRBC: 0 % (ref 0.0–0.2)

## 2018-08-19 LAB — CMP (CANCER CENTER ONLY)
ALT: 23 U/L (ref 0–44)
AST: 19 U/L (ref 15–41)
Albumin: 4.4 g/dL (ref 3.5–5.0)
Alkaline Phosphatase: 85 U/L (ref 38–126)
Anion gap: 6 (ref 5–15)
BUN: 20 mg/dL (ref 8–23)
CO2: 34 mmol/L — ABNORMAL HIGH (ref 22–32)
Calcium: 9.7 mg/dL (ref 8.9–10.3)
Chloride: 96 mmol/L — ABNORMAL LOW (ref 98–111)
Creatinine: 1 mg/dL (ref 0.61–1.24)
GFR, Est AFR Am: >60 mL/min (ref >60)
GFR, Estimated: >60 mL/min (ref >60)
Glucose, Bld: 87 mg/dL (ref 70–99)
Potassium: 4.8 mmol/L (ref 3.5–5.1)
Sodium: 136 mmol/L (ref 135–145)
Total Bilirubin: 0.6 mg/dL (ref 0.3–1.2)
Total Protein: 6.8 g/dL (ref 6.5–8.1)

## 2018-08-19 NOTE — Progress Notes (Signed)
Hematology and Oncology Follow Up Visit  Douglas Barrera [id number] August 04, 1951 67 y.o. 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 is here today for follow-up. He continues to do well and states that he is feeling great.  He is on 3-4 litis of supplemental O2 via nasal cannula 24 hours a day. He has his portable tank and is getting around nicely.  His Hct continues to improve and is down to 50.5% from 56.9%.  He has almost completely quit smoking and rarely has a cigarette after taking off his oxygen and going outside.  No fever, chills, n/v, cough, rash, dizziness, SOB, chest pain, palpitations, abdominal pain or changes in bowel or bladder habits.  No episodes of bleeding, no bruising or petechiae.  No lymphadenopathy noted on exam.  He is maintained a good appetite and is staying well hydrated. His weight is stable.   ECOG Performance Status: 1 - Symptomatic but completely ambulatory  Medications:  Allergies as of 08/19/2018      Reactions   Niacin And Related Other (See Comments)   Unknown Childhood reaction.      Medication List       Accurate as of August 19, 2018  1:49 PM. Always use your most recent med list.        albuterol 108 (90 Base) MCG/ACT inhaler Commonly known as:  PROVENTIL HFA;VENTOLIN HFA Inhale 1 to 2 puffs 4 x day or every 4 hours to rescue asthma   aspirin 81 MG tablet Take 81 mg by mouth daily.   b complex vitamins tablet Take 1 tablet by mouth daily.   diphenhydrAMINE 25 MG tablet Commonly known as:  BENADRYL Take 25 mg by mouth. Take 2 tablets bid   fluticasone 50 MCG/ACT nasal spray Commonly known as:  FLONASE Place 2 sprays into both nostrils daily.   formoterol 20 MCG/2ML nebulizer solution Commonly known as:  PERFOROMIST INHALE THE CONTENTS OF 1   VIAL VIA NEBULIZER TWO     TIMES A DAY     guaifenesin 400 MG Tabs tablet Commonly known as:  HUMIBID E Take 400 mg by mouth 2 (two) times daily.   ipratropium 0.06 % nasal spray Commonly known as:  ATROVENT Use 1 to 2 sprays each nostril 2 to 3 x /day   Revefenacin 175 MCG/3ML Soln Commonly known as:  YUPELRI Inhale 3 mLs into the lungs daily.   traZODone 150 MG tablet Commonly known as:  DESYREL TAKE 1/3 TO 1/2 TO 1 TABLET BY MOUTH 1 HOUR BEFORE SLEEP   Vitamin D3 125 MCG (5000 UT) Caps Take 10,000 Units by mouth daily.   zinc gluconate 50 MG tablet Take 50 mg by mouth daily.       Allergies:  Allergies  Allergen Reactions  . Niacin And Related Other (See Comments)    Unknown Childhood reaction.    Past Medical History, Surgical history, Social history, and Family History were reviewed and updated.  Review of Systems: All other 10 point review of systems is negative.   Physical Exam:  height is 5\' 5"  (1.651 m) and weight is 152 lb (68.9 kg). His oral temperature is 97.8 F (36.6 C). His blood pressure is 99/58 (abnormal) and his pulse is 91. His respiration is 18 and oxygen saturation is 93%.   Wt Readings from Last 3 Encounters:  08/19/18 152 lb (  68.9 kg)  07/07/18 151 lb (68.5 kg)  06/17/18 148 lb 12 oz (67.5 kg)    Ocular: Sclerae unicteric, pupils equal, round and reactive to light Ear-nose-throat: Oropharynx clear, dentition fair Lymphatic: No cervical, supraclavicular or axillary adenopathy Lungs no rales or rhonchi, good excursion bilaterally Heart regular rate and rhythm, no murmur appreciated Abd soft, nontender, positive bowel sounds, no liver or spleen tip palpated on exam, no fluid wave  MSK no focal spinal tenderness, no joint edema Neuro: non-focal, well-oriented, appropriate affect Breasts: Deferred   Lab Results  Component Value Date   WBC 7.5 08/19/2018   HGB 16.8 08/19/2018   HCT 50.5 08/19/2018   MCV 93.7 08/19/2018   PLT 194 08/19/2018   Lab Results  Component Value  Date   FERRITIN 36 03/17/2018   IRON 81 03/17/2018   TIBC 349 03/17/2018   UIBC 268 03/17/2018   IRONPCTSAT 23 (L) 03/17/2018   Lab Results  Component Value Date   RBC 5.39 08/19/2018   No results found for: KPAFRELGTCHN, LAMBDASER, KAPLAMBRATIO No results found for: IGGSERUM, IGA, IGMSERUM No results found for: Odetta Pink, SPEI   Chemistry      Component Value Date/Time   NA 141 06/17/2018 1301   K 5.6 (H) 06/17/2018 1301   CL 98 06/17/2018 1301   CO2 32 06/17/2018 1301   BUN 17 06/17/2018 1301   CREATININE 0.81 06/17/2018 1301   CREATININE 0.93 06/07/2018 1000      Component Value Date/Time   CALCIUM 10.4 (H) 06/17/2018 1301   ALKPHOS 93 06/17/2018 1301   AST 26 06/17/2018 1301   ALT 25 06/17/2018 1301   BILITOT 0.7 06/17/2018 1301       Impression and Plan: [name] is a very pleasant 67 yo caucasian gentleman with secondary polycythemia due to COPD and smoking. He is doing well and has almost completely stopped smoking.  Hct continues to come down nicely and is 50.5%.  We will continues to follow along with him and plan to see him back in another 6 months.  No phlebotomy needed. He will take his baby aspirin daily with food.  He will contact our office with any questions or concerns. We can certainly see him sooner if need be.   Laverna Peace, NP 12/19/20191:49 PM

## 2018-08-20 ENCOUNTER — Other Ambulatory Visit: Payer: Self-pay | Admitting: Physician Assistant

## 2018-08-20 DIAGNOSIS — G4700 Insomnia, unspecified: Secondary | ICD-10-CM

## 2018-09-07 ENCOUNTER — Encounter: Payer: Self-pay | Admitting: Internal Medicine

## 2018-09-07 NOTE — Progress Notes (Signed)
[facility]   [name], M.D.     [name], P.A.-C Liane Comber, [place]                [address] [place], [place] [address] Telephone [phone] Telefax [phone] Annual  Screening/Preventative Visit  & Comprehensive Evaluation & Examination     This very nice 68 y.o. MWM presents for a Screening /Preventative Visit & comprehensive evaluation and management of multiple medical co-morbidities.  Patient has been followed for HTN, HLD, COPD,  Prediabetes and Vitamin D Deficiency.     Patient has severe COPD controlled with MDI  & nebulized meds and unfortunately continues to smoke well informed of the consequences of continuing smoking. He's followed by Dr Carney Corners (Pulmonary). This past year he has been followed by Hematology (Dr Marin Olp & Laverna Peace, NP)  for secondary polycythemia and he has also begun continuous Oxygen therapy. He moitors O2 sats and regulate his O2 flow rate between 2.5 - 4 l/min to keep his O2 sats in the low 90's range. He does admit still occasionally "sneaking " a cigar  (outside away from his O2).     Due to his long history of smoking over 40+ pk years,  I discussed lung cancer screening with him.He was agreeable to undergo a screening low dose CT scan of the chest.  We discussed smoking cessation techniques/options.  I will refer him for a LDCT lung scan & lung cancer screening      HTN predates since 1997. Patient's BP has been controlled at home.  Today's BP is at goal - 110/76. Patient denies any cardiac symptoms as chest pain, palpitations, shortness of breath, dizziness or ankle swelling.     Patient's hyperlipidemia is controlled with diet and medications. Patient denies myalgias or other medication SE's. Last lipids were at goal: Lab Results  Component Value Date   CHOL 144 02/17/2018   HDL 42 02/17/2018   LDLCALC 85 02/17/2018   TRIG  82 02/17/2018   CHOLHDL 3.4 02/17/2018      Patient has hx/o prediabetes (A1c 6.0% / 2010)  and patient denies reactive hypoglycemic symptoms, visual blurring, diabetic polys or paresthesias. Last A1c was not at goal: Lab Results  Component Value Date   HGBA1C 5.9 (H) 02/17/2018       Finally, patient has history of Vitamin D Deficiency (A1c 6.0% / 2010)  and last vitamin D was at goal: Lab Results  Component Value Date   VD25OH 83 02/17/2018   Current Outpatient Medications on File Prior to Visit  Medication Sig  . Albuterol HFA inhaler 1 to 2 puffs 4 x day or every 4 hours to rescue asthma  . aspirin 81 MG  Take  daily.  Marland Kitchen b complex vitamins  Take 1 tablet  daily.  Marland Kitchen VITAMIN D 5000 units  Take 10,000 Units by mouth daily.   . diphenhydrAMINE  25 MG  Take 2 tablets bid  . FLONASE nasal spray Place 2 sprays into both nostrils daily.  Marland Kitchen PERFOROMIST 20 MCG/2ML neb soln INHALE  1   VIAL VIA NEB 2 x /day   . guaifenesin  400 MG TABS  Take 400 mg  2 x daily.  Marland Kitchen ipratropium  0.06 % nasal spray Use 1 to 2 sprays each nostril 2 to 3 x /day  . YUPELRI 175  MCG/3ML SOLN Inhale 3 mLs  daily. - Not Using  . traZODone  150 MG TAKE 1/3-1/2-1 TAB  1 HR BEFORE SLEEP  . zinc gluconate 50 MG  Take 50 mg  daily.   Allergies  Allergen Reactions  . Niacin And Related Other (See Comments)    Unknown Childhood reaction.   Past Medical History:  Diagnosis Date  . COPD (chronic obstructive pulmonary disease) ([place])   . GERD (gastroesophageal reflux disease)   . History of elevated lipids   . IBS (irritable bowel syndrome)   . Labile hypertension   . Prediabetes   . Vitamin D deficiency    Health Maintenance  Topic Date Due  . Hepatitis C Screening  04/23/1951  . COLONOSCOPY  09/27/2018  . TETANUS/TDAP  09/02/2019  . INFLUENZA VACCINE  Completed  . PNA vac Low Risk Adult  Completed   Immunization History  Administered Date(s) Administered  . Influenza Split 06/20/2013, 06/20/2014, 06/28/2015   . Influenza, High Dose Seasonal PF 06/17/2017, 05/24/2018, 06/14/2018  . Influenza,inj,quad, With Preservative 07/21/2016  . Influenza-Unspecified 06/17/2017  . PPD Test 06/20/2014, 06/28/2015, 07/21/2016  . Pneumococcal Conjugate-13 06/20/2014  . Pneumococcal Polysaccharide-23 04/30/2009, 08/12/2017  . Tdap 09/01/2009   Last Colon - 09/20/2013 - Dr Fuller Plan - recommended 5 yr f/u  Past Surgical History:  Procedure Laterality Date  . BASAL CELL CARCINOMA EXCISION  2008   Left [place]  . TESTICLE SURGERY     testicle removed   Family History  Problem Relation Age of Onset  . Breast cancer Mother   . Stomach cancer Mother    Social History   Socioeconomic History  . Marital status: Married    Spouse name: [place] Scientific laboratory technician)   . Number of children: None  Occupational History  . Retired Art therapist  Tobacco Use  . Smoking status: Current Every Day Smoker    Types: Cigars  . Smokeless tobacco: Never Used  . Tobacco comment: quit smoking cigarettes, but smokes 3-4 cigars a week.  Substance and Sexual Activity  . Alcohol use: No    Alcohol/week: 0.0 standard drinks  . Drug use: No  . Sexual activity: Not on file    ROS Constitutional: Denies fever, chills, weight loss/gain, headaches, insomnia,  night sweats or change in appetite. Does c/o fatigue. Eyes: Denies redness, blurred vision, diplopia, discharge, itchy or watery eyes.  ENT: Denies discharge, congestion, post nasal drip, epistaxis, sore throat, earache, hearing loss, dental pain, Tinnitus, Vertigo, Sinus pain or snoring.  Cardio: Denies chest pain, palpitations, irregular heartbeat, syncope, dyspnea, diaphoresis, orthopnea, PND, claudication or edema Respiratory: denies cough, dyspnea, DOE, pleurisy, hoarseness, laryngitis or wheezing.  Gastrointestinal: Denies dysphagia, heartburn, reflux, water brash, pain, cramps, nausea, vomiting, bloating, diarrhea, constipation, hematemesis, melena, hematochezia, jaundice or  hemorrhoids Genitourinary: Denies dysuria, frequency, urgency, nocturia, hesitancy, discharge, hematuria or flank pain Musculoskeletal: Denies arthralgia, myalgia, stiffness, Jt. Swelling, pain, limp or strain/sprain. Denies Falls. Skin: Denies puritis, rash, hives, warts, acne, eczema or change in skin lesion Neuro: No weakness, tremor, incoordination, spasms, paresthesia or pain Psychiatric: Denies confusion, memory loss or sensory loss. Denies Depression. Endocrine: Denies change in weight, skin, hair change, nocturia, and paresthesia, diabetic polys, visual blurring or hyper / hypo glycemic episodes.  Heme/Lymph: No excessive bleeding, bruising or enlarged lymph nodes.  Physical Exam  BP 110/76   Pulse 92   Temp (!) 97.4 F (36.3 C)   Resp 18   Ht 5\' 5"  (1.651 m)   Wt 154 lb 12.8 oz (70.2  kg)   SpO2 97%   BMI 25.76 kg/m   General Appearance: Well nourished and well groomed and in no apparent distress.  Eyes: PERRLA, EOMs, conjunctiva no swelling or erythema, normal fundi and vessels. Sinuses: No frontal/maxillary tenderness ENT/Mouth: EACs patent / TMs  nl. Nares clear without erythema, swelling, mucoid exudates. Oral hygiene is good. No erythema, swelling, or exudate. Tongue normal, non-obstructing. Tonsils not swollen or erythematous. Hearing normal.  Neck: Supple, thyroid not palpable. No bruits, nodes or JVD. Respiratory: Respiratory effort normal.  BS equal and clear bilateral without rales, rhonci, wheezing or stridor. Cardio: Heart sounds are normal with regular rate and rhythm and no murmurs, rubs or gallops. Peripheral pulses are normal and equal bilaterally without edema. No aortic or femoral bruits. Chest: symmetric with normal excursions and percussion.  Abdomen: Soft, with Nl bowel sounds. Nontender, no guarding, rebound, hernias, masses, or organomegaly.  Lymphatics: Non tender without lymphadenopathy.  Genitourinary: No hernias.Testes nl. DRE - prostate nl for age  - smooth & firm w/o nodules. Musculoskeletal: Full ROM all peripheral extremities, joint stability, 5/5 strength, and normal gait. Skin: Warm and dry without rashes, lesions, cyanosis, clubbing or  ecchymosis.  Neuro: Cranial nerves intact, reflexes equal bilaterally. Normal muscle tone, no cerebellar symptoms. Sensation intact.  Pysch: Alert and oriented X 3 with normal affect, insight and judgment appropriate.   Assessment and Plan  1. Annual Preventative/Screening Exam   2. Labile hypertension  - EKG 12-Lead - [place], RETROPERITNL ABD,  LTD - Urinalysis, Routine w reflex microscopic - Microalbumin / creatinine urine ratio - CBC with Differential/Platelet - COMPLETE METABOLIC PANEL WITH GFR - Magnesium - TSH  3. Hyperlipidemia, mixed  - EKG 12-Lead - [place], RETROPERITNL ABD,  LTD - Lipid panel - TSH  4. Prediabetes  - EKG 12-Lead - [place], RETROPERITNL ABD,  LTD - Hemoglobin A1c - Insulin, random  5. Vitamin D deficiency  - VITAMIN D 25 Hydroxyl  6. Chronic obstructive pulmonary disease  ([place])   7. Polycythemia secondary to smoking  - CBC with Differential/Platelet  8. Gastroesophageal reflux disease  - CBC with Differential/Platelet  9. Screening for colorectal cancer  - POC Hemoccult Bld/Stl  - Ambulatory referral to Gastroenterology  10. Benign localized prostatic hyperplasia with lower urinary tract symptoms (LUTS)  - PSA  11. Prostate cancer screening  - PSA  12. Screening for ischemic heart disease  - EKG 12-Lead  13. Coronary atherosclerosis due to calcified coronary lesion  - EKG 12-Lead  14. Smoker  - [place], RETROPERITNL ABD,  LTD  15. Aortic atherosclerosis (HCC)  - [place], RETROPERITNL ABD,  LTD  16. Screening for AAA (aortic abdominal aneurysm)  - [place], RETROPERITNL ABD,  LTD  17. Fatigue, unspecified type  - Iron,Total/Total Iron Binding Cap - Vitamin B12 - Testosterone - CBC with Differential/Platelet  18. Medication  management  - Urinalysis, Routine w reflex microscopic - Microalbumin / creatinine urine ratio - CBC with Differential/Platelet - COMPLETE METABOLIC PANEL WITH GFR - Magnesium - Lipid panel - TSH - Hemoglobin A1c - Insulin, random - VITAMIN D 25 Hydroxyl  19. Adenomatous polyp of colon, unspecified part of colon  - Ambulatory referral to Gastroenterology  20. Encounter for screening for malignant neoplasm of respiratory organs  - CT CHEST LUNG CA SCREEN LOW DOSE W/O CM; Future        Patient was counseled in prudent diet, weight control to achieve/maintain BMI less than 25, BP monitoring, regular exercise and medications as discussed.  Discussed med  effects and SE's. Routine screening labs and tests as requested with regular follow-up as recommended. Over 40 minutes of exam, counseling, chart review and high complex critical decision making was performed

## 2018-09-07 NOTE — Patient Instructions (Signed)

## 2018-09-08 ENCOUNTER — Ambulatory Visit: Payer: BLUE CROSS/BLUE SHIELD | Admitting: Internal Medicine

## 2018-09-08 VITALS — BP 110/76 | HR 92 | Temp 97.4°F | Resp 18 | Ht 65.0 in | Wt 154.8 lb

## 2018-09-08 DIAGNOSIS — Z79899 Other long term (current) drug therapy: Secondary | ICD-10-CM | POA: Diagnosis not present

## 2018-09-08 DIAGNOSIS — R5383 Other fatigue: Secondary | ICD-10-CM

## 2018-09-08 DIAGNOSIS — R0989 Other specified symptoms and signs involving the circulatory and respiratory systems: Secondary | ICD-10-CM

## 2018-09-08 DIAGNOSIS — R350 Frequency of micturition: Secondary | ICD-10-CM

## 2018-09-08 DIAGNOSIS — I2510 Atherosclerotic heart disease of native coronary artery without angina pectoris: Secondary | ICD-10-CM

## 2018-09-08 DIAGNOSIS — E782 Mixed hyperlipidemia: Secondary | ICD-10-CM

## 2018-09-08 DIAGNOSIS — Z131 Encounter for screening for diabetes mellitus: Secondary | ICD-10-CM | POA: Diagnosis not present

## 2018-09-08 DIAGNOSIS — Z125 Encounter for screening for malignant neoplasm of prostate: Secondary | ICD-10-CM

## 2018-09-08 DIAGNOSIS — R7303 Prediabetes: Secondary | ICD-10-CM

## 2018-09-08 DIAGNOSIS — N401 Enlarged prostate with lower urinary tract symptoms: Secondary | ICD-10-CM

## 2018-09-08 DIAGNOSIS — I2584 Coronary atherosclerosis due to calcified coronary lesion: Secondary | ICD-10-CM

## 2018-09-08 DIAGNOSIS — E559 Vitamin D deficiency, unspecified: Secondary | ICD-10-CM | POA: Diagnosis not present

## 2018-09-08 DIAGNOSIS — Z0001 Encounter for general adult medical examination with abnormal findings: Secondary | ICD-10-CM

## 2018-09-08 DIAGNOSIS — F17200 Nicotine dependence, unspecified, uncomplicated: Secondary | ICD-10-CM

## 2018-09-08 DIAGNOSIS — Z0000 Encounter for general adult medical examination without abnormal findings: Secondary | ICD-10-CM | POA: Diagnosis not present

## 2018-09-08 DIAGNOSIS — D751 Secondary polycythemia: Secondary | ICD-10-CM

## 2018-09-08 DIAGNOSIS — Z1389 Encounter for screening for other disorder: Secondary | ICD-10-CM

## 2018-09-08 DIAGNOSIS — I10 Essential (primary) hypertension: Secondary | ICD-10-CM

## 2018-09-08 DIAGNOSIS — Z1212 Encounter for screening for malignant neoplasm of rectum: Secondary | ICD-10-CM

## 2018-09-08 DIAGNOSIS — Z122 Encounter for screening for malignant neoplasm of respiratory organs: Secondary | ICD-10-CM

## 2018-09-08 DIAGNOSIS — Z130 Encounter for screening for diseases of the blood and blood-forming organs and certain disorders involving the immune mechanism: Secondary | ICD-10-CM | POA: Diagnosis not present

## 2018-09-08 DIAGNOSIS — Z1211 Encounter for screening for malignant neoplasm of colon: Secondary | ICD-10-CM

## 2018-09-08 DIAGNOSIS — J449 Chronic obstructive pulmonary disease, unspecified: Secondary | ICD-10-CM

## 2018-09-08 DIAGNOSIS — I700 Atherosclerosis of aorta: Secondary | ICD-10-CM

## 2018-09-08 DIAGNOSIS — Z1329 Encounter for screening for other suspected endocrine disorder: Secondary | ICD-10-CM | POA: Diagnosis not present

## 2018-09-08 DIAGNOSIS — K219 Gastro-esophageal reflux disease without esophagitis: Secondary | ICD-10-CM

## 2018-09-08 DIAGNOSIS — Z136 Encounter for screening for cardiovascular disorders: Secondary | ICD-10-CM | POA: Diagnosis not present

## 2018-09-08 DIAGNOSIS — D126 Benign neoplasm of colon, unspecified: Secondary | ICD-10-CM

## 2018-09-08 DIAGNOSIS — Z13220 Encounter for screening for lipoid disorders: Secondary | ICD-10-CM | POA: Diagnosis not present

## 2018-09-09 LAB — CBC WITH DIFFERENTIAL/PLATELET
Absolute Monocytes: 567 cells/uL (ref 200–950)
Basophils Absolute: 21 cells/uL (ref 0–200)
Basophils Relative: 0.4 %
Eosinophils Absolute: 21 cells/uL (ref 15–500)
Eosinophils Relative: 0.4 %
HCT: 47.9 % (ref 38.5–50.0)
Hemoglobin: 16.3 g/dL (ref 13.2–17.1)
Lymphs Abs: 634 cells/uL — ABNORMAL LOW (ref 850–3900)
MCH: 31.1 pg (ref 27.0–33.0)
MCHC: 34 g/dL (ref 32.0–36.0)
MCV: 91.4 fL (ref 80.0–100.0)
MPV: 10.5 fL (ref 7.5–12.5)
Monocytes Relative: 10.9 %
Neutro Abs: 3957 cells/uL (ref 1500–7800)
Neutrophils Relative %: 76.1 %
Platelets: 142 10*3/uL (ref 140–400)
RBC: 5.24 10*6/uL (ref 4.20–5.80)
RDW: 12.4 % (ref 11.0–15.0)
Total Lymphocyte: 12.2 %
WBC: 5.2 10*3/uL (ref 3.8–10.8)

## 2018-09-09 LAB — LIPID PANEL
Cholesterol: 103 mg/dL (ref <200)
HDL: 31 mg/dL — ABNORMAL LOW (ref >40)
LDL Cholesterol (Calc): 55 mg/dL (calc)
Non-HDL Cholesterol (Calc): 72 mg/dL (calc) (ref <130)
Total CHOL/HDL Ratio: 3.3 (calc) (ref <5.0)
Triglycerides: 83 mg/dL (ref <150)

## 2018-09-09 LAB — URINALYSIS, ROUTINE W REFLEX MICROSCOPIC
Bacteria, UA: NONE SEEN /HPF
Bilirubin Urine: NEGATIVE
Glucose, UA: NEGATIVE
Hgb urine dipstick: NEGATIVE
Hyaline Cast: NONE SEEN /LPF
Ketones, ur: NEGATIVE
Leukocytes, UA: NEGATIVE
Nitrite: NEGATIVE
Specific Gravity, Urine: 1.025 (ref 1.001–1.03)
Squamous Epithelial / LPF: NONE SEEN /HPF (ref <=5)
WBC, UA: NONE SEEN /HPF (ref 0–5)
pH: 5.5 (ref 5.0–8.0)

## 2018-09-09 LAB — COMPLETE METABOLIC PANEL WITH GFR
AG Ratio: 1.8 (calc) (ref 1.0–2.5)
ALT: 23 U/L (ref 9–46)
AST: 30 U/L (ref 10–35)
Albumin: 4.1 g/dL (ref 3.6–5.1)
Alkaline phosphatase (APISO): 60 U/L (ref 40–115)
BUN: 20 mg/dL (ref 7–25)
CO2: 30 mmol/L (ref 20–32)
Calcium: 8.7 mg/dL (ref 8.6–10.3)
Chloride: 99 mmol/L (ref 98–110)
Creat: 0.83 mg/dL (ref 0.70–1.25)
GFR, Est African American: 106 mL/min/{1.73_m2} (ref >=60)
GFR, Est Non African American: 92 mL/min/{1.73_m2} (ref >=60)
Globulin: 2.3 g/dL (calc) (ref 1.9–3.7)
Glucose, Bld: 95 mg/dL (ref 65–99)
Potassium: 4 mmol/L (ref 3.5–5.3)
Sodium: 136 mmol/L (ref 135–146)
Total Bilirubin: 0.6 mg/dL (ref 0.2–1.2)
Total Protein: 6.4 g/dL (ref 6.1–8.1)

## 2018-09-09 LAB — IRON, TOTAL/TOTAL IRON BINDING CAP
%SAT: 20 % (calc) (ref 20–48)
Iron: 49 ug/dL — ABNORMAL LOW (ref 50–180)
TIBC: 247 mcg/dL (calc) — ABNORMAL LOW (ref 250–425)

## 2018-09-09 LAB — VITAMIN B12: Vitamin B-12: 428 pg/mL (ref 200–1100)

## 2018-09-09 LAB — PSA: PSA: 0.5 ng/mL (ref <=4.0)

## 2018-09-09 LAB — MICROALBUMIN / CREATININE URINE RATIO
Creatinine, Urine: 116 mg/dL (ref 20–320)
Microalb Creat Ratio: 9 mcg/mg creat (ref <30)
Microalb, Ur: 1 mg/dL

## 2018-09-09 LAB — MAGNESIUM: Magnesium: 1.7 mg/dL (ref 1.5–2.5)

## 2018-09-09 LAB — HEMOGLOBIN A1C
Hgb A1c MFr Bld: 5.9 % of total Hgb — ABNORMAL HIGH (ref <5.7)
Mean Plasma Glucose: 123 (calc)
eAG (mmol/L): 6.8 (calc)

## 2018-09-09 LAB — INSULIN, RANDOM: Insulin: 28.1 u[IU]/mL — ABNORMAL HIGH (ref 2.0–19.6)

## 2018-09-09 LAB — TSH: TSH: 2.08 mIU/L (ref 0.40–4.50)

## 2018-09-09 LAB — VITAMIN D 25 HYDROXY (VIT D DEFICIENCY, FRACTURES): Vit D, 25-Hydroxy: 65 ng/mL (ref 30–100)

## 2018-09-09 LAB — TESTOSTERONE: Testosterone: 233 ng/dL — ABNORMAL LOW (ref 250–827)

## 2018-09-15 ENCOUNTER — Ambulatory Visit
Admission: RE | Admit: 2018-09-15 | Discharge: 2018-09-15 | Disposition: A | Discharge status: Home / Self Care | Payer: BLUE CROSS/BLUE SHIELD | Source: Ambulatory Visit | Attending: Internal Medicine | Admitting: Internal Medicine

## 2018-09-15 DIAGNOSIS — Z122 Encounter for screening for malignant neoplasm of respiratory organs: Secondary | ICD-10-CM

## 2018-09-16 ENCOUNTER — Other Ambulatory Visit: Payer: Self-pay | Admitting: Internal Medicine

## 2018-09-16 DIAGNOSIS — Z1212 Encounter for screening for malignant neoplasm of rectum: Principal | ICD-10-CM

## 2018-09-16 DIAGNOSIS — I2584 Coronary atherosclerosis due to calcified coronary lesion: Principal | ICD-10-CM

## 2018-09-16 DIAGNOSIS — Z1211 Encounter for screening for malignant neoplasm of colon: Secondary | ICD-10-CM

## 2018-09-16 DIAGNOSIS — I2510 Atherosclerotic heart disease of native coronary artery without angina pectoris: Secondary | ICD-10-CM

## 2018-09-16 DIAGNOSIS — K635 Polyp of colon: Secondary | ICD-10-CM

## 2018-10-04 ENCOUNTER — Telehealth: Payer: Self-pay

## 2018-10-04 ENCOUNTER — Encounter: Payer: Self-pay | Admitting: Physician Assistant

## 2018-10-04 ENCOUNTER — Ambulatory Visit: Payer: BLUE CROSS/BLUE SHIELD | Admitting: Physician Assistant

## 2018-10-04 VITALS — BP 90/54 | HR 96 | Ht 63.5 in | Wt 153.1 lb

## 2018-10-04 DIAGNOSIS — Z01818 Encounter for other preprocedural examination: Secondary | ICD-10-CM | POA: Diagnosis not present

## 2018-10-04 DIAGNOSIS — Z9981 Dependence on supplemental oxygen: Secondary | ICD-10-CM | POA: Diagnosis not present

## 2018-10-04 DIAGNOSIS — J449 Chronic obstructive pulmonary disease, unspecified: Secondary | ICD-10-CM

## 2018-10-04 DIAGNOSIS — Z86010 Personal history of colonic polyps: Secondary | ICD-10-CM | POA: Diagnosis not present

## 2018-10-04 MED ORDER — NA SULFATE-K SULFATE-MG SULF 17.5-3.13-1.6 GM/177ML PO SOLN: 1.0000 | Freq: Once | ORAL | 0 refills | Status: AC

## 2018-10-04 NOTE — Telephone Encounter (Signed)
PCCM:  Yes. He has COPD and Chronic hypoxemic respiratory failure. He is at moderate risk for peri-operative respiratory complications but should do fine with close anesthesia observation and procedure completed in an endoscopy unit.   Thanks  Garner Nash, DO [facility] 10/04/2018 6:08 PM

## 2018-10-04 NOTE — Progress Notes (Signed)
Chief Complaint: Preprocedural visit for surveillance colonoscopy in a patient on oxygen  HPI:    Douglas Barrera is a 68 year old Caucasian male, known to Dr. Fuller Plan, with a past medical history as listed below including severe COPD on O2 at 3 L, who was referred to me by [name], MD for a preprocedural visit for a colonoscopy due to his history of adenomatous polyps.    09/20/2013 colonoscopy Dr. Fuller Plan with a sessile polyp measuring 7 mm in the transverse colon, moderate diverticulosis and moderate internal hemorrhoids.  Pathology revealed tubular adenoma.  Repeat was recommended in 5 years.    Today, the patient presents clinic and explains that he was recently started on oxygen for his severe COPD.  Feels much better with the oxygen having a lot more energy.  He denies any GI complaints at all.  No change in bowel habits or abdominal pain.    Denies fever, chills, weight loss, nausea, vomiting, heartburn, reflux or symptoms that awaken him from sleep.  Past Medical History:  Diagnosis Date  . COPD (chronic obstructive pulmonary disease) ([place])   . GERD (gastroesophageal reflux disease)   . History of elevated lipids   . IBS (irritable bowel syndrome)   . Labile hypertension   . Prediabetes   . Tubular adenoma of colon   . Vitamin D deficiency     Past Surgical History:  Procedure Laterality Date  . BASAL CELL CARCINOMA EXCISION  2008   Left [place]  . TESTICLE SURGERY     testicle removed    Current Outpatient Medications  Medication Sig Dispense Refill  . albuterol (PROVENTIL HFA;VENTOLIN HFA) 108 (90 Base) MCG/ACT inhaler Inhale 1 to 2 puffs 4 x day or every 4 hours to rescue asthma 3 Inhaler 3  . aspirin 81 MG tablet Take 81 mg by mouth daily.    Marland Kitchen b complex vitamins tablet Take 1 tablet by mouth daily.    . Cholecalciferol (VITAMIN D3) 5000 units CAPS Take 10,000 Units by mouth daily.     . diphenhydrAMINE (BENADRYL) 25 MG tablet Take 25 mg by mouth. Take 2 tablets bid      . fluticasone (FLONASE) 50 MCG/ACT nasal spray Place 2 sprays into both nostrils daily. 48 g 3  . formoterol (PERFOROMIST) 20 MCG/2ML nebulizer solution INHALE THE CONTENTS OF 1   VIAL VIA NEBULIZER TWO     TIMES A DAY 360 mL 3  . guaifenesin (HUMIBID E) 400 MG TABS tablet Take 400 mg by mouth 2 (two) times daily.    Marland Kitchen ipratropium (ATROVENT) 0.06 % nasal spray Use 1 to 2 sprays each nostril 2 to 3 x /day 45 mL 3  . Revefenacin (YUPELRI) 175 MCG/3ML SOLN Inhale 3 mLs into the lungs daily. 90 mL 11  . traZODone (DESYREL) 150 MG tablet TAKE 1/3 TO 1/2 TO 1 TABLET BY MOUTH 1 HOUR BEFORE SLEEP 90 tablet 0  . zinc gluconate 50 MG tablet Take 50 mg by mouth daily.     No current facility-administered medications for this visit.     Allergies as of 10/04/2018 - Review Complete 10/04/2018  Allergen Reaction Noted  . Niacin and related Other (See Comments) 07/03/2014    Family History  Problem Relation Age of Onset  . Breast cancer Mother   . Stomach cancer Mother     Social History   Socioeconomic History  . Marital status: Married    Spouse name: Not on file  . Number of children: 0  .  Years of education: Not on file  . Highest education level: Not on file  Occupational History  . Occupation: retired  Scientific laboratory technician  . Financial resource strain: Not on file  . Food insecurity:    Worry: Not on file    Inability: Not on file  . Transportation needs:    Medical: Not on file    Non-medical: Not on file  Tobacco Use  . Smoking status: Current Some Day Smoker    Types: Cigars, Cigarettes  . Smokeless tobacco: Never Used  . Tobacco comment: quit smoking cigarettes in Dec 2019 but do backslide occasional, but smokes 3-4 cigars a week.  Substance and Sexual Activity  . Alcohol use: No    Alcohol/week: 0.0 standard drinks  . Drug use: No  . Sexual activity: Not on file  Lifestyle  . Physical activity:    Days per week: Not on file    Minutes per session: Not on file  . Stress:  Not on file  Relationships  . Social connections:    Talks on phone: Not on file    Gets together: Not on file    Attends religious service: Not on file    Active member of club or organization: Not on file    Attends meetings of clubs or organizations: Not on file    Relationship status: Not on file  . Intimate partner violence:    Fear of current or ex partner: Not on file    Emotionally abused: Not on file    Physically abused: Not on file    Forced sexual activity: Not on file  Other Topics Concern  . Not on file  Social History Narrative  . Not on file    Review of Systems:    Constitutional: No weight loss, fever or chills Skin: No rash Cardiovascular: No chest pain Respiratory: +chronic SOB Gastrointestinal: See HPI and otherwise negative Genitourinary: No dysuria Neurological: No headache, dizziness or syncope Musculoskeletal: No new muscle or joint pain Hematologic: No bleeding  Psychiatric: No history of depression or anxiety   Physical Exam:  Vital signs: BP (!) 90/54 (BP Location: Left Arm, Patient Position: Sitting, Cuff Size: Normal)   Pulse 96   Ht 5' 3.5" (1.613 m)   Wt 153 lb 2 oz (69.5 kg)   BMI 26.70 kg/m   Constitutional:   Pleasant Caucasian male appears to be in NAD, Well developed, Well nourished, alert and cooperative Head:  Normocephalic and atraumatic. Eyes:   PEERL, EOMI. No icterus. Conjunctiva pink. Ears:  Normal auditory acuity. Neck:  Supple Throat: Oral cavity and pharynx without inflammation, swelling or lesion.  Respiratory: Respirations even and unlabored. Lungs clear to auscultation bilaterally.   No wheezes, crackles, or rhonchi. +O2 at 3L via [place] Cardiovascular: Normal S1, S2. No MRG. Regular rate and rhythm. No peripheral edema, cyanosis or pallor.  Gastrointestinal:  Soft, nondistended, nontender. No rebound or guarding. Normal bowel sounds. No appreciable masses or hepatomegaly. Rectal:  Not performed.  Msk:  Symmetrical  without gross deformities. Without edema, no deformity or joint abnormality.  Neurologic:  Alert and  oriented x4;  grossly normal neurologically.  Skin:   Dry and intact without significant lesions or rashes. Psychiatric: Demonstrates good judgement and reason without abnormal affect or behaviors.  MOST RECENT LABS AND IMAGING: CBC    Component Value Date/Time   WBC 5.2 09/08/2018 0950   RBC 5.24 09/08/2018 0950   HGB 16.3 09/08/2018 0950   HGB 16.8 08/19/2018 1308  HCT 47.9 09/08/2018 0950   PLT 142 09/08/2018 0950   PLT 194 08/19/2018 1308   MCV 91.4 09/08/2018 0950   MCH 31.1 09/08/2018 0950   MCHC 34.0 09/08/2018 0950   RDW 12.4 09/08/2018 0950   LYMPHSABS 634 (L) 09/08/2018 0950   MONOABS 1.3 (H) 08/19/2018 1308   EOSABS 21 09/08/2018 0950   BASOSABS 21 09/08/2018 0950    CMP     Component Value Date/Time   NA 136 09/08/2018 0950   K 4.0 09/08/2018 0950   CL 99 09/08/2018 0950   CO2 30 09/08/2018 0950   GLUCOSE 95 09/08/2018 0950   BUN 20 09/08/2018 0950   CREATININE 0.83 09/08/2018 0950   CALCIUM 8.7 09/08/2018 0950   PROT 6.4 09/08/2018 0950   ALBUMIN 4.4 08/19/2018 1308   AST 30 09/08/2018 0950   AST 19 08/19/2018 1308   ALT 23 09/08/2018 0950   ALT 23 08/19/2018 1308   ALKPHOS 85 08/19/2018 1308   BILITOT 0.6 09/08/2018 0950   BILITOT 0.6 08/19/2018 1308   GFRNONAA 92 09/08/2018 0950   GFRAA 106 09/08/2018 0950    Assessment: 1.  History of adenomatous polyps: Last colonoscopy in 2015 with recommendation for repeat in 5 years 2.  Severe COPD: Oxygen dependent on 3 L continuous 3.  Oxygen dependent  Plan: 1.  Patient was scheduled for his surveillance colonoscopy in the hospital given his severe COPD.  We will also request pulmonary clearance from his pulmonologist Dr. Valeta Harms to ensure that sedation is safe for him.  Did discuss risks, benefits, limitations and alternatives and the patient agrees to proceed.  This was scheduled with Dr. Fuller Plan. 2.   Patient to follow in clinic per recommendations after time of procedure.  Ellouise Newer, PA-C [facility] 10/04/2018, 8:43 AM  Cc: [name], MD

## 2018-10-04 NOTE — H&P (View-Only) (Signed)
Chief Complaint: Preprocedural visit for surveillance colonoscopy in a patient on oxygen  HPI:    Douglas Barrera is a 68 year old Caucasian male, known to Dr. Fuller Plan, with a past medical history as listed below including severe COPD on O2 at 3 L, who was referred to me by [name], MD for a preprocedural visit for a colonoscopy due to his history of adenomatous polyps.    09/20/2013 colonoscopy Dr. Fuller Plan with a sessile polyp measuring 7 mm in the transverse colon, moderate diverticulosis and moderate internal hemorrhoids.  Pathology revealed tubular adenoma.  Repeat was recommended in 5 years.    Today, the patient presents clinic and explains that he was recently started on oxygen for his severe COPD.  Feels much better with the oxygen having a lot more energy.  He denies any GI complaints at all.  No change in bowel habits or abdominal pain.    Denies fever, chills, weight loss, nausea, vomiting, heartburn, reflux or symptoms that awaken him from sleep.  Past Medical History:  Diagnosis Date  . COPD (chronic obstructive pulmonary disease) ([place])   . GERD (gastroesophageal reflux disease)   . History of elevated lipids   . IBS (irritable bowel syndrome)   . Labile hypertension   . Prediabetes   . Tubular adenoma of colon   . Vitamin D deficiency     Past Surgical History:  Procedure Laterality Date  . BASAL CELL CARCINOMA EXCISION  2008   Left [place]  . TESTICLE SURGERY     testicle removed    Current Outpatient Medications  Medication Sig Dispense Refill  . albuterol (PROVENTIL HFA;VENTOLIN HFA) 108 (90 Base) MCG/ACT inhaler Inhale 1 to 2 puffs 4 x day or every 4 hours to rescue asthma 3 Inhaler 3  . aspirin 81 MG tablet Take 81 mg by mouth daily.    Marland Kitchen b complex vitamins tablet Take 1 tablet by mouth daily.    . Cholecalciferol (VITAMIN D3) 5000 units CAPS Take 10,000 Units by mouth daily.     . diphenhydrAMINE (BENADRYL) 25 MG tablet Take 25 mg by mouth. Take 2 tablets bid      . fluticasone (FLONASE) 50 MCG/ACT nasal spray Place 2 sprays into both nostrils daily. 48 g 3  . formoterol (PERFOROMIST) 20 MCG/2ML nebulizer solution INHALE THE CONTENTS OF 1   VIAL VIA NEBULIZER TWO     TIMES A DAY 360 mL 3  . guaifenesin (HUMIBID E) 400 MG TABS tablet Take 400 mg by mouth 2 (two) times daily.    Marland Kitchen ipratropium (ATROVENT) 0.06 % nasal spray Use 1 to 2 sprays each nostril 2 to 3 x /day 45 mL 3  . Revefenacin (YUPELRI) 175 MCG/3ML SOLN Inhale 3 mLs into the lungs daily. 90 mL 11  . traZODone (DESYREL) 150 MG tablet TAKE 1/3 TO 1/2 TO 1 TABLET BY MOUTH 1 HOUR BEFORE SLEEP 90 tablet 0  . zinc gluconate 50 MG tablet Take 50 mg by mouth daily.     No current facility-administered medications for this visit.     Allergies as of 10/04/2018 - Review Complete 10/04/2018  Allergen Reaction Noted  . Niacin and related Other (See Comments) 07/03/2014    Family History  Problem Relation Age of Onset  . Breast cancer Mother   . Stomach cancer Mother     Social History   Socioeconomic History  . Marital status: Married    Spouse name: Not on file  . Number of children: 0  .  Years of education: Not on file  . Highest education level: Not on file  Occupational History  . Occupation: retired  Scientific laboratory technician  . Financial resource strain: Not on file  . Food insecurity:    Worry: Not on file    Inability: Not on file  . Transportation needs:    Medical: Not on file    Non-medical: Not on file  Tobacco Use  . Smoking status: Current Some Day Smoker    Types: Cigars, Cigarettes  . Smokeless tobacco: Never Used  . Tobacco comment: quit smoking cigarettes in Dec 2019 but do backslide occasional, but smokes 3-4 cigars a week.  Substance and Sexual Activity  . Alcohol use: No    Alcohol/week: 0.0 standard drinks  . Drug use: No  . Sexual activity: Not on file  Lifestyle  . Physical activity:    Days per week: Not on file    Minutes per session: Not on file  . Stress:  Not on file  Relationships  . Social connections:    Talks on phone: Not on file    Gets together: Not on file    Attends religious service: Not on file    Active member of club or organization: Not on file    Attends meetings of clubs or organizations: Not on file    Relationship status: Not on file  . Intimate partner violence:    Fear of current or ex partner: Not on file    Emotionally abused: Not on file    Physically abused: Not on file    Forced sexual activity: Not on file  Other Topics Concern  . Not on file  Social History Narrative  . Not on file    Review of Systems:    Constitutional: No weight loss, fever or chills Skin: No rash Cardiovascular: No chest pain Respiratory: +chronic SOB Gastrointestinal: See HPI and otherwise negative Genitourinary: No dysuria Neurological: No headache, dizziness or syncope Musculoskeletal: No new muscle or joint pain Hematologic: No bleeding  Psychiatric: No history of depression or anxiety   Physical Exam:  Vital signs: BP (!) 90/54 (BP Location: Left Arm, Patient Position: Sitting, Cuff Size: Normal)   Pulse 96   Ht 5' 3.5" (1.613 m)   Wt 153 lb 2 oz (69.5 kg)   BMI 26.70 kg/m   Constitutional:   Pleasant Caucasian male appears to be in NAD, Well developed, Well nourished, alert and cooperative Head:  Normocephalic and atraumatic. Eyes:   PEERL, EOMI. No icterus. Conjunctiva pink. Ears:  Normal auditory acuity. Neck:  Supple Throat: Oral cavity and pharynx without inflammation, swelling or lesion.  Respiratory: Respirations even and unlabored. Lungs clear to auscultation bilaterally.   No wheezes, crackles, or rhonchi. +O2 at 3L via [place] Cardiovascular: Normal S1, S2. No MRG. Regular rate and rhythm. No peripheral edema, cyanosis or pallor.  Gastrointestinal:  Soft, nondistended, nontender. No rebound or guarding. Normal bowel sounds. No appreciable masses or hepatomegaly. Rectal:  Not performed.  Msk:  Symmetrical  without gross deformities. Without edema, no deformity or joint abnormality.  Neurologic:  Alert and  oriented x4;  grossly normal neurologically.  Skin:   Dry and intact without significant lesions or rashes. Psychiatric: Demonstrates good judgement and reason without abnormal affect or behaviors.  MOST RECENT LABS AND IMAGING: CBC    Component Value Date/Time   WBC 5.2 09/08/2018 0950   RBC 5.24 09/08/2018 0950   HGB 16.3 09/08/2018 0950   HGB 16.8 08/19/2018 1308  HCT 47.9 09/08/2018 0950   PLT 142 09/08/2018 0950   PLT 194 08/19/2018 1308   MCV 91.4 09/08/2018 0950   MCH 31.1 09/08/2018 0950   MCHC 34.0 09/08/2018 0950   RDW 12.4 09/08/2018 0950   LYMPHSABS 634 (L) 09/08/2018 0950   MONOABS 1.3 (H) 08/19/2018 1308   EOSABS 21 09/08/2018 0950   BASOSABS 21 09/08/2018 0950    CMP     Component Value Date/Time   NA 136 09/08/2018 0950   K 4.0 09/08/2018 0950   CL 99 09/08/2018 0950   CO2 30 09/08/2018 0950   GLUCOSE 95 09/08/2018 0950   BUN 20 09/08/2018 0950   CREATININE 0.83 09/08/2018 0950   CALCIUM 8.7 09/08/2018 0950   PROT 6.4 09/08/2018 0950   ALBUMIN 4.4 08/19/2018 1308   AST 30 09/08/2018 0950   AST 19 08/19/2018 1308   ALT 23 09/08/2018 0950   ALT 23 08/19/2018 1308   ALKPHOS 85 08/19/2018 1308   BILITOT 0.6 09/08/2018 0950   BILITOT 0.6 08/19/2018 1308   GFRNONAA 92 09/08/2018 0950   GFRAA 106 09/08/2018 0950    Assessment: 1.  History of adenomatous polyps: Last colonoscopy in 2015 with recommendation for repeat in 5 years 2.  Severe COPD: Oxygen dependent on 3 L continuous 3.  Oxygen dependent  Plan: 1.  Patient was scheduled for his surveillance colonoscopy in the hospital given his severe COPD.  We will also request pulmonary clearance from his pulmonologist Dr. Valeta Harms to ensure that sedation is safe for him.  Did discuss risks, benefits, limitations and alternatives and the patient agrees to proceed.  This was scheduled with Dr. Fuller Plan. 2.   Patient to follow in clinic per recommendations after time of procedure.  Douglas Newer, PA-C [facility] 10/04/2018, 8:43 AM  Cc: [name], MD

## 2018-10-04 NOTE — Patient Instructions (Signed)

## 2018-10-04 NOTE — Telephone Encounter (Signed)
  10/04/2018   RE: KAMERAN MCNEESE DOB: 1951-05-16 MRN: [id number]   Dear Dr. Valeta Harms,    We have scheduled the above patient for an endoscopic procedure. Our records show that he was recently put on Oxengen.   Please advise if it is ok to proceed with his colonoscopy, which is scheduled at [facility] for 10/26/2018.  Please fax back/ or route the completed form to [employer] at [phone].   Sincerely,    Phillis Haggis

## 2018-10-04 NOTE — Progress Notes (Signed)
Reviewed and agree with management plan.  Dolorez Jeffrey T. Deniz Eskridge, MD FACG 

## 2018-10-05 NOTE — Telephone Encounter (Signed)
Noted. Nothing further is needed at this time.

## 2018-10-05 NOTE — Telephone Encounter (Signed)
Per Dr. Valeta Harms, patient is cleared for procedure from a Pulmonary standpoint.

## 2018-10-08 ENCOUNTER — Encounter: Payer: Self-pay | Admitting: Cardiovascular Disease

## 2018-10-08 ENCOUNTER — Ambulatory Visit: Payer: BLUE CROSS/BLUE SHIELD | Admitting: Cardiovascular Disease

## 2018-10-08 DIAGNOSIS — I2584 Coronary atherosclerosis due to calcified coronary lesion: Secondary | ICD-10-CM

## 2018-10-08 DIAGNOSIS — F17200 Nicotine dependence, unspecified, uncomplicated: Secondary | ICD-10-CM | POA: Diagnosis not present

## 2018-10-08 DIAGNOSIS — I2510 Atherosclerotic heart disease of native coronary artery without angina pectoris: Secondary | ICD-10-CM | POA: Diagnosis not present

## 2018-10-08 NOTE — Progress Notes (Signed)
10/08/2018 Douglas Barrera   [birth year]  [id number]  Primary Physician [name], MD Primary Cardiologist: Lorretta Harp MD FACP, [place], [place], [place]  HPI:  Douglas Barrera is a 68 y.o. thin appearing married Caucasian male with no children referred by Dr. Melford Aase for cardiac evaluation because of a recent chest CT performed 09/15/2018 that showed coronary calcification in all 3 coronary arteries.  He has no cardiac risk factors other than tobacco abuse having smoked 80 pack years and continues to smoke several cigars a day.  He has no family history of heart disease.  Never had a heart attack or stroke.  He denies chest pain but was short of breath constantly prior to put on O2.  He had a chest CT performed 09/15/2022 lung cancer screening that showed coronary calcification in all 3 coronary arteries.   Current Meds  Medication Sig  . albuterol (PROVENTIL HFA;VENTOLIN HFA) 108 (90 Base) MCG/ACT inhaler Inhale 1 to 2 puffs 4 x day or every 4 hours to rescue asthma  . aspirin 81 MG tablet Take 81 mg by mouth daily.  Marland Kitchen b complex vitamins tablet Take 1 tablet by mouth daily.  . Cholecalciferol (VITAMIN D3) 5000 units CAPS Take 10,000 Units by mouth daily.   . diphenhydrAMINE (BENADRYL) 25 MG tablet Take 25 mg by mouth. Take 2 tablets bid  . fluticasone (FLONASE) 50 MCG/ACT nasal spray Place 2 sprays into both nostrils daily.  . formoterol (PERFOROMIST) 20 MCG/2ML nebulizer solution INHALE THE CONTENTS OF 1   VIAL VIA NEBULIZER TWO     TIMES A DAY  . guaifenesin (HUMIBID E) 400 MG TABS tablet Take 400 mg by mouth 2 (two) times daily.  Marland Kitchen ipratropium (ATROVENT) 0.06 % nasal spray Use 1 to 2 sprays each nostril 2 to 3 x /day  . Revefenacin (YUPELRI) 175 MCG/3ML SOLN Inhale 3 mLs into the lungs daily.  . traZODone (DESYREL) 150 MG tablet TAKE 1/3 TO 1/2 TO 1 TABLET BY MOUTH 1 HOUR BEFORE SLEEP  . zinc gluconate 50 MG tablet Take 50 mg by mouth daily.     Allergies  Allergen Reactions  .  Niacin And Related Other (See Comments)    Unknown Childhood reaction.    Social History   Socioeconomic History  . Marital status: Married    Spouse name: Not on file  . Number of children: 0  . Years of education: Not on file  . Highest education level: Not on file  Occupational History  . Occupation: retired  Scientific laboratory technician  . Financial resource strain: Not on file  . Food insecurity:    Worry: Not on file    Inability: Not on file  . Transportation needs:    Medical: Not on file    Non-medical: Not on file  Tobacco Use  . Smoking status: Current Some Day Smoker    Types: Cigars, Cigarettes  . Smokeless tobacco: Never Used  . Tobacco comment: quit smoking cigarettes in Dec 2019 but do backslide occasional, but smokes 3-4 cigars a week.  Substance and Sexual Activity  . Alcohol use: No    Alcohol/week: 0.0 standard drinks  . Drug use: No  . Sexual activity: Not on file  Lifestyle  . Physical activity:    Days per week: Not on file    Minutes per session: Not on file  . Stress: Not on file  Relationships  . Social connections:    Talks on phone: Not on file  Gets together: Not on file    Attends religious service: Not on file    Active member of club or organization: Not on file    Attends meetings of clubs or organizations: Not on file    Relationship status: Not on file  . Intimate partner violence:    Fear of current or ex partner: Not on file    Emotionally abused: Not on file    Physically abused: Not on file    Forced sexual activity: Not on file  Other Topics Concern  . Not on file  Social History Narrative  . Not on file     Review of Systems: General: negative for chills, fever, night sweats or weight changes.  Cardiovascular: negative for chest pain, dyspnea on exertion, edema, orthopnea, palpitations, paroxysmal nocturnal dyspnea or shortness of breath Dermatological: negative for rash Respiratory: negative for cough or wheezing Urologic:  negative for hematuria Abdominal: negative for nausea, vomiting, diarrhea, bright red blood per rectum, melena, or hematemesis Neurologic: negative for visual changes, syncope, or dizziness All other systems reviewed and are otherwise negative except as noted above.    Blood pressure 110/72, pulse 79, height 5\' 4"  (1.626 m), weight 152 lb 3.2 oz (69 kg).  General appearance: alert and no distress Neck: no adenopathy, no carotid bruit, no JVD, supple, symmetrical, trachea midline and thyroid not enlarged, symmetric, no tenderness/mass/nodules Lungs: clear to auscultation bilaterally Heart: regular rate and rhythm, S1, S2 normal, no murmur, click, rub or gallop Extremities: extremities normal, atraumatic, no cyanosis or edema Pulses: 2+ and symmetric Skin: Skin color, texture, turgor normal. No rashes or lesions Neurologic: Alert and oriented X 3, normal strength and tone. Normal symmetric reflexes. Normal coordination and gait  EKG normal sinus rhythm at 79 without ST or T wave changes.  Personally reviewed this EKG.  ASSESSMENT AND PLAN:   Smoker History of tobacco abuse having smoked 80 pack years now smoking several cigars a day.  He does have oxygen dependent COPD as well.  Coronary atherosclerosis History of coronary atherosclerosis on recent chest CT performed 09/15/2018 with calcium in all 3 coronary arteries.  He denies chest pain.  We will get a coronary CTA to further evaluate.      Lorretta Harp MD FACP,FACC,FAHA, [place] 10/08/2018 10:00 AM

## 2018-10-08 NOTE — Patient Instructions (Addendum)
Please arrive at the [facility] main entrance of [facility] at xx:xx AM (30-45 minutes prior to test start time)  [facility],  [id number] [phone]  Proceed to the [facility] Radiology Department (First Floor).  Please follow these instructions carefully (unless otherwise directed):  Hold all erectile dysfunction medications at least 48 hours prior to test.  On the Night Before the Test: . Be sure to Drink plenty of water. . Do not consume any caffeinated/decaffeinated beverages or chocolate 12 hours prior to your test. . Do not take any antihistamines 12 hours prior to your test. . If you take Metformin do not take 24 hours prior to test. . If the patient has contrast allergy: ? Patient will need a prescription for Prednisone and very clear instructions (as follows): 1. Prednisone 50 mg - take 13 hours prior to test 2. Take another Prednisone 50 mg 7 hours prior to test 3. Take another Prednisone 50 mg 1 hour prior to test 4. Take Benadryl 50 mg 1 hour prior to test . Patient must complete all four doses of above prophylactic medications. . Patient will need a ride after test due to Benadryl.  On the Day of the Test: . Drink plenty of water. Do not drink any water within one hour of the test. . Do not eat any food 4 hours prior to the test. . You may take your regular medications prior to the test.  .   Do not give Lopressor to patients with an allergy to lopressor or anyone with asthma or active COPD symptoms (currently taking steroids).       After the Test: . Drink plenty of water. . After receiving IV contrast, you may experience a mild flushed feeling. This is normal. . On occasion, you may experience a mild rash up to 24 hours after the test. This is not dangerous. If this occurs, you can take Benadryl 25 mg and increase your fluid intake. . If you experience trouble breathing, this can be serious. If it is severe  call 911 IMMEDIATELY. If it is mild, please call our office. . If you take any of these medications: Glipizide/Metformin, Avandament, Glucavance, please do not take 48 hours after completing test.  FOLLOW UP:  FOLLOW UP AS NEEDED

## 2018-10-08 NOTE — Assessment & Plan Note (Signed)
History of coronary atherosclerosis on recent chest CT performed 09/15/2018 with calcium in all 3 coronary arteries.  He denies chest pain.  We will get a coronary CTA to further evaluate.

## 2018-10-08 NOTE — Assessment & Plan Note (Signed)
History of tobacco abuse having smoked 80 pack years now smoking several cigars a day.  He does have oxygen dependent COPD as well.

## 2018-10-13 ENCOUNTER — Encounter: Payer: Self-pay | Admitting: Pulmonary Disease

## 2018-10-13 ENCOUNTER — Other Ambulatory Visit: Payer: Self-pay | Admitting: Internal Medicine

## 2018-10-13 ENCOUNTER — Ambulatory Visit: Payer: BLUE CROSS/BLUE SHIELD | Admitting: Pulmonary Disease

## 2018-10-13 VITALS — BP 98/60 | HR 91 | Ht 64.0 in | Wt 152.4 lb

## 2018-10-13 DIAGNOSIS — J9611 Chronic respiratory failure with hypoxia: Secondary | ICD-10-CM | POA: Diagnosis not present

## 2018-10-13 DIAGNOSIS — F17200 Nicotine dependence, unspecified, uncomplicated: Secondary | ICD-10-CM

## 2018-10-13 DIAGNOSIS — J449 Chronic obstructive pulmonary disease, unspecified: Secondary | ICD-10-CM

## 2018-10-13 DIAGNOSIS — D751 Secondary polycythemia: Secondary | ICD-10-CM

## 2018-10-13 MED ORDER — FORMOTEROL FUMARATE 20 MCG/2ML IN NEBU: 20.0000 ug | INHALATION_SOLUTION | Freq: Two times a day (BID) | RESPIRATORY_TRACT | 0 refills | Status: DC

## 2018-10-13 MED ORDER — REVEFENACIN 175 MCG/3ML IN SOLN: 1.0000 | Freq: Every day | RESPIRATORY_TRACT | 0 refills | Status: DC

## 2018-10-13 NOTE — Patient Instructions (Addendum)
Thank you for visiting Dr. Valeta Harms at [facility]. Today we recommend the following:  Continue your current nebulized medication regimen. We will give you samples of these medications again today.  Once you switch your insurance provider in April please contact us and we will attempt to obtain another qualification for a POC.  Please continue to remain smoke-free.  Return in about 6 months (around 04/13/2019).  Please call us with any questions or concerns.  Or if you have any change in your respiratory symptoms.

## 2018-10-13 NOTE — Addendum Note (Signed)
Addended by: Amado Coe on: 10/13/2018 09:28 AM   Modules accepted: Orders

## 2018-10-13 NOTE — Progress Notes (Signed)
Synopsis: Referred in September 2019 for severe COPD by [name], MD  Subjective:   PATIENT ID: Douglas Barrera GENDER: male DOB: 02-15-1951, MRN: [id number]  Chief Complaint  Patient presents with  . Follow-up    He was diagnosed with COPD about 3-4 years ago. He quit smoking a week ago after being placed on oxygen. He feels much better after being placed on O2. He has much more energy after start O2. He has never been hospitalized for an exacerbation. Smoked for 40 years, 1 ppd.  Patient denies weight loss, fevers, chills, night sweats.  He does have sputum production.  He is able to complete most of his activities of daily living.  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  He used to travel around to local bakery's as well as a crossed many states to teach institutions and bakeries how to bake certain tasks.  He is able to go to the grocery store, pushes on buggy as well as mow his grass while riding a lawnmower.  OV 07/07/2018: Patient been doing well since last office visit.  He is trying to obtain POC.  Insurance has required requalification for O2 needs.  Presents today for walking in the office for that.  He has been using his new nebulized medications.  He is not really sure he seen much difference but he does think that his functional status is good.  Today he plans to go home and mow the grass on riding mower.  He is able to go to the grocery store.  He still is able to work in his kitchen and bake.  Overall doing well.  OV 10/13/2018: This is a 68 year old with very severe COPD.  Overall has been doing well for the past several months.  He had a low-dose lung cancer screening CT which revealed coronary calcifications and no evidence or/concern of a underlying malignancy.  This is being done by his primary care provider."  Radiology recommended a one-year follow-up.  He does have recent evaluation by cardiology that plans a CT a coronary evaluation.  Overall he has been doing well and has no significant  complaints today.  He enjoys using his nebulizers.  He plans to pick a cherry pie this weekend for his wife/Valentine's Day.  As for his current respiratory symptoms he maintains with some dyspnea on exertion.  He does have a planned colonoscopy for next week.   Past Medical History:  Diagnosis Date  . COPD (chronic obstructive pulmonary disease) ([place])   . GERD (gastroesophageal reflux disease)   . History of elevated lipids   . IBS (irritable bowel syndrome)   . Labile hypertension   . Prediabetes   . Tubular adenoma of colon   . Vitamin D deficiency      Family History  Problem Relation Age of Onset  . Breast cancer Mother   . Stomach cancer Mother      Past Surgical History:  Procedure Laterality Date  . BASAL CELL CARCINOMA EXCISION  2008   Left Giebler  . TESTICLE SURGERY     testicle removed    Social History   Socioeconomic History  . Marital status: Married    Spouse name: Not on file  . Number of children: 0  . Years of education: Not on file  . Highest education level: Not on file  Occupational History  . Occupation: retired  Scientific laboratory technician  . Financial resource strain: Not on file  . Food insecurity:    Worry: Not  on file    Inability: Not on file  . Transportation needs:    Medical: Not on file    Non-medical: Not on file  Tobacco Use  . Smoking status: Former Smoker    Packs/day: 2.00    Years: 49.00    Pack years: 98.00    Types: Cigars, Cigarettes    Start date: 10/17/1968    Last attempt to quit: 08/18/2018    Years since quitting: 0.1  . Smokeless tobacco: Never Used  . Tobacco comment: quit smoking cigarettes in Dec 2019 but do backslide occasional, but smokes 3-4 cigars a week. small sigar on occasion 10/13/18  Substance and Sexual Activity  . Alcohol use: No    Alcohol/week: 0.0 standard drinks  . Drug use: No  . Sexual activity: Not on file  Lifestyle  . Physical activity:    Days per week: Not on file    Minutes per session: Not on  file  . Stress: Not on file  Relationships  . Social connections:    Talks on phone: Not on file    Gets together: Not on file    Attends religious service: Not on file    Active member of club or organization: Not on file    Attends meetings of clubs or organizations: Not on file    Relationship status: Not on file  . Intimate partner violence:    Fear of current or ex partner: Not on file    Emotionally abused: Not on file    Physically abused: Not on file    Forced sexual activity: Not on file  Other Topics Concern  . Not on file  Social History Narrative  . Not on file     Allergies  Allergen Reactions  . Niacin And Related Other (See Comments)    Unknown Childhood reaction.     Outpatient Medications Prior to Visit  Medication Sig Dispense Refill  . albuterol (PROVENTIL HFA;VENTOLIN HFA) 108 (90 Base) MCG/ACT inhaler Inhale 1 to 2 puffs 4 x day or every 4 hours to rescue asthma 3 Inhaler 3  . aspirin 81 MG tablet Take 81 mg by mouth daily.    Marland Kitchen b complex vitamins tablet Take 1 tablet by mouth daily.    . Cholecalciferol (VITAMIN D3) 5000 units CAPS Take 10,000 Units by mouth daily.     . diphenhydrAMINE (BENADRYL) 25 MG tablet Take 25 mg by mouth. Take 2 tablets bid    . fluticasone (FLONASE) 50 MCG/ACT nasal spray Place 2 sprays into both nostrils daily. 48 g 3  . formoterol (PERFOROMIST) 20 MCG/2ML nebulizer solution INHALE CONTENTS OF 1 VIAL VIA NEBULIZER TWICE A DAY 360 mL 4  . guaifenesin (HUMIBID E) 400 MG TABS tablet Take 400 mg by mouth 2 (two) times daily.    Marland Kitchen ipratropium (ATROVENT) 0.06 % nasal spray Use 1 to 2 sprays each nostril 2 to 3 x /day 45 mL 3  . Revefenacin (YUPELRI) 175 MCG/3ML SOLN Inhale 3 mLs into the lungs daily. 90 mL 11  . traZODone (DESYREL) 150 MG tablet TAKE 1/3 TO 1/2 TO 1 TABLET BY MOUTH 1 HOUR BEFORE SLEEP 90 tablet 0  . zinc gluconate 50 MG tablet Take 50 mg by mouth daily.     No facility-administered medications prior to visit.      Review of Systems  Constitutional: Negative for chills, fever, malaise/fatigue and weight loss.  HENT: Negative for hearing loss, sore throat and tinnitus.   Eyes: Negative for  blurred vision and double vision.  Respiratory: Positive for shortness of breath. Negative for cough, hemoptysis, sputum production, wheezing and stridor.   Cardiovascular: Negative for chest pain, palpitations, orthopnea, leg swelling and PND.  Gastrointestinal: Negative for abdominal pain, constipation, diarrhea, heartburn, nausea and vomiting.  Genitourinary: Negative for dysuria, hematuria and urgency.  Musculoskeletal: Negative for joint pain and myalgias.  Skin: Negative for itching and rash.  Neurological: Negative for dizziness, tingling, weakness and headaches.  Endo/Heme/Allergies: Negative for environmental allergies. Does not bruise/bleed easily.  Psychiatric/Behavioral: Negative for depression. The patient is not nervous/anxious and does not have insomnia.   All other systems reviewed and are negative.    Objective:  Physical Exam Vitals signs reviewed.  Constitutional:      General: He is not in acute distress.    Appearance: He is well-developed.  HENT:     Head: Normocephalic and atraumatic.     Mouth/Throat:     Pharynx: No oropharyngeal exudate.  Eyes:     Conjunctiva/sclera: Conjunctivae normal.     Pupils: Pupils are equal, round, and reactive to light.  Neck:     Vascular: No JVD.     Trachea: No tracheal deviation.     Comments: Loss of supraclavicular fat Cardiovascular:     Rate and Rhythm: Normal rate and regular rhythm.     Heart sounds: S1 normal and S2 normal.     Comments: Distant heart tones Pulmonary:     Effort: No tachypnea or accessory muscle usage.     Breath sounds: No stridor. Decreased breath sounds (throughout all lung fields) present. No wheezing, rhonchi or rales.     Comments: Significantly diminished breath sounds bilaterally. Abdominal:     General:  Bowel sounds are normal. There is no distension.     Palpations: Abdomen is soft.     Tenderness: There is no abdominal tenderness.  Musculoskeletal:        General: Deformity (muscle wasting ) present.  Skin:    General: Skin is warm and dry.     Capillary Refill: Capillary refill takes less than 2 seconds.     Findings: No rash.  Neurological:     Mental Status: He is alert and oriented to person, place, and time.  Psychiatric:        Behavior: Behavior normal.      Vitals:   10/13/18 0855  BP: (!) 88/48  Pulse: 91  SpO2: 92%  Weight: 152 lb 6.4 oz (69.1 kg)  Height: 5\' 4"  (1.626 m)   92% on 4 L nasal cannula BMI Readings from Last 3 Encounters:  10/13/18 26.16 kg/m  10/08/18 26.13 kg/m  10/04/18 26.70 kg/m   Wt Readings from Last 3 Encounters:  10/13/18 152 lb 6.4 oz (69.1 kg)  10/08/18 152 lb 3.2 oz (69 kg)  10/04/18 153 lb 2 oz (69.5 kg)     CBC    Component Value Date/Time   WBC 5.2 09/08/2018 0950   RBC 5.24 09/08/2018 0950   HGB 16.3 09/08/2018 0950   HGB 16.8 08/19/2018 1308   HCT 47.9 09/08/2018 0950   PLT 142 09/08/2018 0950   PLT 194 08/19/2018 1308   MCV 91.4 09/08/2018 0950   MCH 31.1 09/08/2018 0950   MCHC 34.0 09/08/2018 0950   RDW 12.4 09/08/2018 0950   LYMPHSABS 634 (L) 09/08/2018 0950   MONOABS 1.3 (H) 08/19/2018 1308   EOSABS 21 09/08/2018 0950   BASOSABS 21 09/08/2018 0950    Chest Imaging: 08/26/2017 CT  chest lung cancer screening, LD CT Lung-RADS 1 radiology, recommending annual continued screening. Evidence of mild bronchial thickening, scattered areas of groundglass, areas of scarring. The patient's images have been independently reviewed by me.   09/15/2018: LDCT -  LUNG RADS 2S, with coronary calcifications  The patient's images have been independently reviewed by me.    Pulmonary Functions Testing Results: PFT Results Latest Ref Rng & Units 05/31/2018 03/24/2018  FVC-Pre L 1.83 1.72  FVC-Predicted Pre % 48 45  FVC-Post  L 1.89 -  FVC-Predicted Post % 50 -  Pre FEV1/FVC % % 46 36  Post FEV1/FCV % % 34 -  FEV1-Pre L 0.84 0.61  FEV1-Predicted Pre % 30 22  FEV1-Post L 0.65 -  DLCO UNC% % 34 27  DLCO COR %Predicted % 52 49  TLC L - 5.85  TLC % Predicted % - 97  RV % Predicted % - 198    FeNO: None   Pathology: None   Echocardiogram: None   Heart Catheterization: None     Assessment & Plan:   Chronic hypoxemic respiratory failure (HCC)  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HCC)  Secondary polycythemia  Smoker  Discussion:  68 year old with very severe COPD, last FEV1 22% predicted, chronic respiratory failure on continuous nasal cannula O2, currently on 3 L nasal cannula and able to maintain this most of the time.  He does have secondary polycythemia.  His overall functional status is great with an mMRC of 1-2 on an average day.  He has declined pulmonary rehabilitation in the past.  He has remained smoke-free he tells me today.  As for management of his severity severe COPD we will continue him on his nebulized LAMA and LABA.  We will give him samples of these medications today.  He can continue his albuterol as needed for shortness of breath and wheezing.  We will recommend follow-up with [place] in clinic in 6 months.  As he has been stable and doing well.  If he does have any change in his respiratory symptoms he was instructed to give [place] a call so we can see him sooner.  His insurance is changing in April and would like to attempt to requalify for a POC.  He is to give [place] a call around this time and we will have a nursing appointment scheduled for him to come in and requalify with a POC walk.  Greater than 50% of this patient's 15-minute office visit was spent face-to-face discussing the above recommendations and treatment plan.   Current Outpatient Medications:  .  albuterol (PROVENTIL HFA;VENTOLIN HFA) 108 (90 Base) MCG/ACT inhaler, Inhale 1 to 2 puffs 4 x day or every 4 hours to rescue asthma, Disp: 3  Inhaler, Rfl: 3 .  aspirin 81 MG tablet, Take 81 mg by mouth daily., Disp: , Rfl:  .  b complex vitamins tablet, Take 1 tablet by mouth daily., Disp: , Rfl:  .  Cholecalciferol (VITAMIN D3) 5000 units CAPS, Take 10,000 Units by mouth daily. , Disp: , Rfl:  .  diphenhydrAMINE (BENADRYL) 25 MG tablet, Take 25 mg by mouth. Take 2 tablets bid, Disp: , Rfl:  .  fluticasone (FLONASE) 50 MCG/ACT nasal spray, Place 2 sprays into both nostrils daily., Disp: 48 g, Rfl: 3 .  formoterol (PERFOROMIST) 20 MCG/2ML nebulizer solution, INHALE CONTENTS OF 1 VIAL VIA NEBULIZER TWICE A DAY, Disp: 360 mL, Rfl: 4 .  guaifenesin (HUMIBID E) 400 MG TABS tablet, Take 400 mg by mouth 2 (two) times daily.,  Disp: , Rfl:  .  ipratropium (ATROVENT) 0.06 % nasal spray, Use 1 to 2 sprays each nostril 2 to 3 x /day, Disp: 45 mL, Rfl: 3 .  Revefenacin (YUPELRI) 175 MCG/3ML SOLN, Inhale 3 mLs into the lungs daily., Disp: 90 mL, Rfl: 11 .  traZODone (DESYREL) 150 MG tablet, TAKE 1/3 TO 1/2 TO 1 TABLET BY MOUTH 1 HOUR BEFORE SLEEP, Disp: 90 tablet, Rfl: 0 .  zinc gluconate 50 MG tablet, Take 50 mg by mouth daily., Disp: , Rfl:    Garner Nash, DO [facility] 10/13/2018 9:14 AM

## 2018-10-25 ENCOUNTER — Encounter (HOSPITAL_COMMUNITY): Payer: Self-pay | Admitting: *Deleted

## 2018-10-25 ENCOUNTER — Telehealth: Payer: Self-pay | Admitting: Cardiovascular Disease

## 2018-10-25 NOTE — Telephone Encounter (Signed)
New Message:    Patient calling to let the doctor and nurse know that he was scheduled to have a CT done. Patient  insurance will not cover this test. Please call patient if there is any questions.

## 2018-10-25 NOTE — Telephone Encounter (Signed)
Recent pt MyChart message 10/25/2018 9:27 AM:  Dr. Gwenlyn Found: You had me scheduled for a procedure at [facility] on March 4th. I recieved a letter from my [employer] insurance on Saturday that said they will not cover this procedure. I spoke to someone in your office and had them cancel the appointment. I will only have [employer] until the end of March. As of April 1st I will be on medicare a & b with a medicare supplement plan. Should we try again after this date?   Thanks in advance   [employer]  [phone]

## 2018-10-25 NOTE — Telephone Encounter (Signed)
Can reschedule after his insurance changes

## 2018-10-26 ENCOUNTER — Other Ambulatory Visit: Payer: Self-pay

## 2018-10-26 ENCOUNTER — Ambulatory Visit (HOSPITAL_COMMUNITY): Payer: BLUE CROSS/BLUE SHIELD | Admitting: Anesthesiology

## 2018-10-26 ENCOUNTER — Ambulatory Visit (HOSPITAL_COMMUNITY)
Admission: RE | Admit: 2018-10-26 | Discharge: 2018-10-26 | Disposition: A | Discharge status: Home / Self Care | Payer: BLUE CROSS/BLUE SHIELD | Attending: Gastroenterology | Admitting: Gastroenterology

## 2018-10-26 ENCOUNTER — Encounter (HOSPITAL_COMMUNITY): Payer: Self-pay | Admitting: *Deleted

## 2018-10-26 ENCOUNTER — Encounter (HOSPITAL_COMMUNITY)
Admission: RE | Disposition: A | Discharge status: Home / Self Care | Payer: Self-pay | Source: Home / Self Care | Attending: Gastroenterology

## 2018-10-26 DIAGNOSIS — Z1211 Encounter for screening for malignant neoplasm of colon: Secondary | ICD-10-CM | POA: Diagnosis not present

## 2018-10-26 DIAGNOSIS — Z9981 Dependence on supplemental oxygen: Secondary | ICD-10-CM | POA: Insufficient documentation

## 2018-10-26 DIAGNOSIS — E559 Vitamin D deficiency, unspecified: Secondary | ICD-10-CM | POA: Diagnosis not present

## 2018-10-26 DIAGNOSIS — I10 Essential (primary) hypertension: Secondary | ICD-10-CM | POA: Diagnosis not present

## 2018-10-26 DIAGNOSIS — Z7951 Long term (current) use of inhaled steroids: Secondary | ICD-10-CM | POA: Insufficient documentation

## 2018-10-26 DIAGNOSIS — E785 Hyperlipidemia, unspecified: Secondary | ICD-10-CM | POA: Diagnosis not present

## 2018-10-26 DIAGNOSIS — K5730 Diverticulosis of large intestine without perforation or abscess without bleeding: Secondary | ICD-10-CM | POA: Diagnosis not present

## 2018-10-26 DIAGNOSIS — K649 Unspecified hemorrhoids: Secondary | ICD-10-CM | POA: Diagnosis not present

## 2018-10-26 DIAGNOSIS — K219 Gastro-esophageal reflux disease without esophagitis: Secondary | ICD-10-CM | POA: Diagnosis not present

## 2018-10-26 DIAGNOSIS — K640 First degree hemorrhoids: Secondary | ICD-10-CM | POA: Diagnosis not present

## 2018-10-26 DIAGNOSIS — F17290 Nicotine dependence, other tobacco product, uncomplicated: Secondary | ICD-10-CM | POA: Insufficient documentation

## 2018-10-26 DIAGNOSIS — Z7982 Long term (current) use of aspirin: Secondary | ICD-10-CM | POA: Diagnosis not present

## 2018-10-26 DIAGNOSIS — I2510 Atherosclerotic heart disease of native coronary artery without angina pectoris: Secondary | ICD-10-CM | POA: Insufficient documentation

## 2018-10-26 DIAGNOSIS — Z86010 Personal history of colonic polyps: Secondary | ICD-10-CM | POA: Diagnosis not present

## 2018-10-26 DIAGNOSIS — Z79899 Other long term (current) drug therapy: Secondary | ICD-10-CM | POA: Insufficient documentation

## 2018-10-26 DIAGNOSIS — Z85828 Personal history of other malignant neoplasm of skin: Secondary | ICD-10-CM | POA: Diagnosis not present

## 2018-10-26 DIAGNOSIS — J449 Chronic obstructive pulmonary disease, unspecified: Secondary | ICD-10-CM | POA: Diagnosis not present

## 2018-10-26 DIAGNOSIS — Z01818 Encounter for other preprocedural examination: Secondary | ICD-10-CM

## 2018-10-26 HISTORY — PX: COLONOSCOPY WITH PROPOFOL: SHX5780

## 2018-10-26 SURGERY — COLONOSCOPY WITH PROPOFOL
Anesthesia: Monitor Anesthesia Care

## 2018-10-26 MED ORDER — LACTATED RINGERS IV SOLN
INTRAVENOUS | Status: DC
  Administered 2018-10-26: 1000 mL via INTRAVENOUS

## 2018-10-26 MED ORDER — SODIUM CHLORIDE 0.9 % IV SOLN: INTRAVENOUS | Status: DC

## 2018-10-26 MED ORDER — PROPOFOL 500 MG/50ML IV EMUL
INTRAVENOUS | Status: DC | PRN
  Administered 2018-10-26: 150 ug/kg/min via INTRAVENOUS

## 2018-10-26 MED ORDER — PROPOFOL 10 MG/ML IV BOLUS
INTRAVENOUS | Status: AC
  Filled 2018-10-26: qty 40

## 2018-10-26 MED ORDER — LIDOCAINE HCL (CARDIAC) PF 100 MG/5ML IV SOSY
PREFILLED_SYRINGE | INTRAVENOUS | Status: DC | PRN
  Administered 2018-10-26: 40 mg via INTRAVENOUS

## 2018-10-26 MED ORDER — PROPOFOL 500 MG/50ML IV EMUL
INTRAVENOUS | Status: DC | PRN
  Administered 2018-10-26: 40 mg via INTRAVENOUS

## 2018-10-26 SURGICAL SUPPLY — 22 items

## 2018-10-26 NOTE — Interval H&P Note (Signed)
History and Physical Interval Note:  10/26/2018 9:09 AM  Douglas Barrera  has presented today for surgery, with the diagnosis of h/o adenomatous polyps  The various methods of treatment have been discussed with the patient and family. After consideration of risks, benefits and other options for treatment, the patient has consented to  Procedure(s): COLONOSCOPY WITH PROPOFOL (N/A) as a surgical intervention .  The patient's history has been reviewed, patient examined, no change in status, stable for surgery.  I have reviewed the patient's chart and labs.  Questions were answered to the patient's satisfaction.     Pricilla Riffle. Fuller Plan

## 2018-10-26 NOTE — Anesthesia Preprocedure Evaluation (Addendum)
Anesthesia Evaluation  Patient identified by MRN, date of birth, ID band Patient awake    Reviewed: Allergy & Precautions, NPO status , Patient's Chart, lab work & pertinent test results  Airway Mallampati: II  TM Distance: >3 FB Neck ROM: Full    Dental  (+) Dental Advisory Given   Pulmonary COPD, former smoker,    Pulmonary exam normal breath sounds clear to auscultation       Cardiovascular hypertension, + CAD  Normal cardiovascular exam Rhythm:Regular Rate:Normal     Neuro/Psych negative neurological ROS  negative psych ROS   GI/Hepatic Neg liver ROS, GERD  ,  Endo/Other  negative endocrine ROS  Renal/GU negative Renal ROS     Musculoskeletal negative musculoskeletal ROS (+)   Abdominal   Peds  Hematology negative hematology ROS (+)   Anesthesia Other Findings   Reproductive/Obstetrics                             Anesthesia Physical Anesthesia Plan  ASA: III  Anesthesia Plan: MAC   Post-op Pain Management:    Induction: Intravenous  PONV Risk Score and Plan: Propofol infusion  Airway Management Planned:   Additional Equipment: None  Intra-op Plan:   Post-operative Plan:   Informed Consent: I have reviewed the patients History and Physical, chart, labs and discussed the procedure including the risks, benefits and alternatives for the proposed anesthesia with the patient or authorized representative who has indicated his/her understanding and acceptance.     Dental advisory given  Plan Discussed with: CRNA  Anesthesia Plan Comments:         Anesthesia Quick Evaluation

## 2018-10-26 NOTE — Anesthesia Postprocedure Evaluation (Signed)
Anesthesia Post Note  Patient: Douglas Barrera  Procedure(s) Performed: COLONOSCOPY WITH PROPOFOL (N/A )     Patient location during evaluation: PACU Anesthesia Type: MAC Level of consciousness: awake and alert Pain management: pain level controlled Vital Signs Assessment: post-procedure vital signs reviewed and stable Respiratory status: spontaneous breathing Cardiovascular status: stable Anesthetic complications: no    Last Vitals:  Vitals:   10/26/18 0950 10/26/18 1000  BP: 102/73 (!) 98/56  Pulse: 85 72  Resp: (!) 22 20  Temp:    SpO2: 97% 98%    Last Pain:  Vitals:   10/26/18 1000  TempSrc:   PainSc: 0-No pain                 Nolon Nations

## 2018-10-26 NOTE — Transfer of Care (Signed)
Immediate Anesthesia Transfer of Care Note  Patient: Douglas Barrera  Procedure(s) Performed: COLONOSCOPY WITH PROPOFOL (N/A )  Patient Location: PACU  Anesthesia Type:MAC  Level of Consciousness: awake, alert  and oriented  Airway & Oxygen Therapy: Patient Spontanous Breathing and Patient connected to nasal cannula oxygen  Post-op Assessment: Report given to RN and Post -op Vital signs reviewed and stable  Post vital signs: Reviewed and stable  Last Vitals:  Vitals Value Taken Time  BP    Temp    Pulse 87 10/26/2018  9:47 AM  Resp 20 10/26/2018  9:47 AM  SpO2 95 % 10/26/2018  9:47 AM  Vitals shown include unvalidated device data.  Last Pain:  Vitals:   10/26/18 0825  TempSrc: Oral  PainSc: 0-No pain         Complications: No apparent anesthesia complications

## 2018-10-26 NOTE — Discharge Instructions (Signed)
YOU HAD AN ENDOSCOPIC PROCEDURE TODAY: Refer to the procedure report and other information in the discharge instructions given to you for any specific questions about what was found during the examination. If this information does not answer your questions, please call [place] office at [phone] to clarify.  ° °YOU SHOULD EXPECT: Some feelings of bloating in the abdomen. Passage of more gas than usual. Walking can help get rid of the air that was put into your GI tract during the procedure and reduce the bloating. If you had a lower endoscopy (such as a colonoscopy or flexible sigmoidoscopy) you may notice spotting of blood in your stool or on the toilet paper. Some abdominal soreness may be present for a day or two, also. ° °DIET: Your first meal following the procedure should be a light meal and then it is ok to progress to your normal diet. A half-sandwich or bowl of soup is an example of a good first meal. Heavy or fried foods are harder to digest and may make you feel nauseous or bloated. Drink plenty of fluids but you should avoid alcoholic beverages for 24 hours. If you had a esophageal dilation, please see attached instructions for diet.   ° °ACTIVITY: Your care partner should take you home directly after the procedure. You should plan to take it easy, moving slowly for the rest of the day. You can resume normal activity the day after the procedure however YOU SHOULD NOT DRIVE, use power tools, machinery or perform tasks that involve climbing or major physical exertion for 24 hours (because of the sedation medicines used during the test).  ° °SYMPTOMS TO REPORT IMMEDIATELY: °A gastroenterologist can be reached at any hour. Please call [phone]  for any of the following symptoms:  °Following lower endoscopy (colonoscopy, flexible sigmoidoscopy) °Excessive amounts of blood in the stool  °Significant tenderness, worsening of abdominal pains  °Swelling of the abdomen that is new, acute  °Fever of 100° or  higher  °Following upper endoscopy (EGD, EUS, ERCP, esophageal dilation) °Vomiting of blood or coffee ground material  °New, significant abdominal pain  °New, significant chest pain or pain under the shoulder blades  °Painful or persistently difficult swallowing  °New shortness of breath  °Black, tarry-looking or red, bloody stools ° °FOLLOW UP:  °If any biopsies were taken you will be contacted by phone or by letter within the next 1-3 weeks. Call [phone]  if you have not heard about the biopsies in 3 weeks.  °Please also call with any specific questions about appointments or follow up tests. ° °

## 2018-10-26 NOTE — Anesthesia Procedure Notes (Signed)
Date/Time: 10/26/2018 9:11 AM Performed by: Glory Buff, CRNA Oxygen Delivery Method: Nasal cannula

## 2018-10-26 NOTE — Op Note (Signed)
[facility] Patient Name: Douglas Barrera Procedure Date: 10/26/2018 MRN: [id number] Attending MD: Ladene Artist , MD Date of Birth: February 26, 1951 CSN: [id number] Age: 68 Admit Type: Outpatient Procedure:                Colonoscopy Indications:              Surveillance: Personal history of adenomatous                            polyps on last colonoscopy 5 years ago Providers:                Pricilla Riffle. Fuller Plan, MD, Cleda Daub, RN, Cletis Athens, Technician, Rosario Adie, CRNA Referring MD:             [name], MD Medicines:                Monitored Anesthesia Care Complications:            No immediate complications. Estimated blood loss:                            None. Estimated Blood Loss:     Estimated blood loss: none. Procedure:                Pre-Anesthesia Assessment:                           - Prior to the procedure, a History and Physical                            was performed, and patient medications and                            allergies were reviewed. The patient's tolerance of                            previous anesthesia was also reviewed. The risks                            and benefits of the procedure and the sedation                            options and risks were discussed with the patient.                            All questions were answered, and informed consent                            was obtained. Prior Anticoagulants: The patient has                            taken no previous anticoagulant or antiplatelet  agents. ASA Grade Assessment: III - A patient with                            severe systemic disease. After reviewing the risks                            and benefits, the patient was deemed in                            satisfactory condition to undergo the procedure.                           After obtaining informed consent, the colonoscope         was passed under direct vision. Throughout the                            procedure, the patient's blood pressure, pulse, and                            oxygen saturations were monitored continuously. The                            CF-HQ190L ([id number]) Olympus colonoscope was                            introduced through the anus and advanced to the the                            cecum, identified by appendiceal orifice and                            ileocecal valve. The ileocecal valve, appendiceal                            orifice, and rectum were photographed. The quality                            of the bowel preparation was adequate after levage                            and suctioning. The colonoscopy was performed                            without difficulty. The patient tolerated the                            procedure well. Scope In: 9:19:50 AM Scope Out: 9:34:23 AM Scope Withdrawal Time: 0 hours 11 minutes 56 seconds  Total Procedure Duration: 0 hours 14 minutes 33 seconds  Findings:      The perianal and digital rectal examinations were normal.      Multiple medium-mouthed diverticula were found in the left colon. There       was evidence of diverticular spasm. There was no evidence of  diverticular bleeding.      Internal hemorrhoids were found during retroflexion. The hemorrhoids       were medium-sized and Grade I (internal hemorrhoids that do not       prolapse).      The exam was otherwise without abnormality on direct and retroflexion       views. Impression:               - Moderate diverticulosis in the left colon.                           - Internal hemorrhoids.                           - No specimens collected. Moderate Sedation:      Not Applicable - Patient had care per Anesthesia. Recommendation:           - Repeat colonoscopy in 5 years for surveillance.                           - Patient has a contact number available for                             emergencies. The signs and symptoms of potential                            delayed complications were discussed with the                            patient. Return to normal activities tomorrow.                            Written discharge instructions were provided to the                            patient.                           - High fiber diet long term.                           - Continue present medications. Procedure Code(s):        --- Professional ---                           A1287, Colorectal cancer screening; colonoscopy on                            individual at high risk Diagnosis Code(s):        --- Professional ---                           Z86.010, Personal history of colonic polyps                           K64.0, First degree hemorrhoids  K57.30, Diverticulosis of large intestine without                            perforation or abscess without bleeding CPT copyright 2018 [employer] Association. All rights reserved. The codes documented in this report are preliminary and upon coder review may  be revised to meet current compliance requirements. Ladene Artist, MD 10/26/2018 9:41:28 AM This report has been signed electronically. Number of Addenda: 0

## 2018-10-28 ENCOUNTER — Encounter (HOSPITAL_COMMUNITY): Payer: Self-pay | Admitting: Gastroenterology

## 2018-10-29 ENCOUNTER — Encounter: Payer: Self-pay | Admitting: Gastroenterology

## 2018-11-03 ENCOUNTER — Ambulatory Visit (HOSPITAL_COMMUNITY): Payer: Medicare Other

## 2018-11-17 ENCOUNTER — Other Ambulatory Visit: Payer: Self-pay | Admitting: Adult Health

## 2018-11-17 DIAGNOSIS — G4700 Insomnia, unspecified: Secondary | ICD-10-CM

## 2018-12-14 NOTE — Progress Notes (Signed)
Virtual Visit via Telephone Note  I connected with Douglas Barrera on 12/15/18 at  8:45 AM EDT by telephone and verified that I am speaking with the correct person using two identifiers.   I discussed the limitations, risks, security and privacy concerns of performing an evaluation and management service by telephone and the availability of in person appointments. I also discussed with the patient that there may be a patient responsible charge related to this service. The patient expressed understanding and agreed to proceed.    I discussed the assessment and treatment plan with the patient. The patient was provided an opportunity to ask questions and all were answered. The patient agreed with the plan and demonstrated an understanding of the instructions.   The patient was advised to call back or seek an in-person evaluation if the symptoms worsen or if the condition fails to improve as anticipated.  I provided 30 minutes of non-face-to-face time during this encounter.   Izora Ribas, NP     MEDICARE [facility] VISIT AND FOLLOW UP Assessment:   Douglas Barrera was seen today for follow-up and medicare wellness.  Diagnoses and all orders for this visit:  Welcome to Medicare preventive visit  Labile hypertension Controlled off of medications Monitor blood pressure at home; call if consistently over 130/80 Continue DASH diet.   Reminder to go to the ER if any CP, SOB, nausea, dizziness, severe HA, changes vision/speech, left arm numbness and tingling and jaw pain.  Coronary atherosclerosis due to calcified coronary lesion Control blood pressure, cholesterol, glucose, increase exercise.   Aortic atherosclerosis (HCC) Control blood pressure, cholesterol, glucose, increase exercise.   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; continue neb/inhalers; O2; counseled to stop smoking  Chronic hypoxemic respiratory failure ([place]) On O2; monitor   Irritable bowel syndrome,  unspecified type Add soluble fiber; monitor; recently fairly managed  Gastroesophageal reflux disease, esophagitis presence not specified Well managed on current medications Discussed diet, avoiding triggers and other lifestyle changes  Vitamin D deficiency At goal at recent check; continue to recommend supplementation for goal of 70-100 Defer vitamin D level  Smoker Rare; neg CT screening 2020  Secondary polycythemia CBC; monitor; follow up Dr. Marin Olp PRN  Other abnormal glucose (prediabetes) Discussed disease and risks Discussed diet/exercise, weight management  A1C  Medication management  Hyperlipidemia Continue low cholesterol diet and exercise.  Check lipid panel.   History of adenomatous polyp of colon UTD on colonoscopy; due 2025  Fatigue, unspecified type Monitor; improved on O2  Overweight (BMI 25.0-29.9) Continue to recommend diet heavy in fruits and veggies and low in animal meats, cheeses, and dairy products, appropriate calorie intake Discuss exercise recommendations routinely Continue to monitor weight at each visit  Left shoulder pain, unspecified chronicity Managing well with aspercreme; mild; evaluate at next in-person OV    Over 30 minutes of exam, counseling, chart review, and critical decision making was performed  Future Appointments  Date Time Provider [place]  02/18/2019  1:15 PM [facility] None  02/18/2019  1:45 PM Ennever, Rudell Cobb, MD [facility] None  03/23/2019  9:30 AM [name], MD [facility] None  09/29/2019 10:00 AM [name], MD [facility] None     Plan:   During the course of the visit the patient was educated and counseled about appropriate screening and preventive services including:    Pneumococcal vaccine   Influenza vaccine  Prevnar 13  Td vaccine  Screening electrocardiogram  Colorectal cancer screening  Diabetes screening  Glaucoma  screening  Nutrition counseling     Subjective:  Douglas Barrera is a 68 y.o. male who presents for Medicare Annual Wellness Visit and 3 month follow up for HTN, hyperlipidemia, prediabetes, and vitamin D Def.   Today he reports 6 months of L shoulder pain with abduction past 90 degrees; doing well with topical aspercreme, denies weakness of extremity, not currently significantly limiting.   Patient has severe COPD consequent of years of smoking, controlled with MDI  & nebulized meds and unfortunately continues to smoke occasionally despite repeated counseling.  He is followed by Dr Carney Corners (Pulmonary).   This past year he has been followed by Hematology (Dr Marin Olp)  for secondary polycythemia and he has also begun continuous Oxygen therapy.  He moitors O2 sats and regulate his O2 flow rate between 2.5 - 4 l/min to keep his O2 sats in the low 90's range. He does admit still occasionally "sneaking " a cigar  (outside away from his O2).   He had negative low dose CT screening in 09/2018.   Trazodone 150 mg daily at night for sleep and endorses restorative sleep.   BMI is Body mass index is 26.43 kg/m., he has been working on diet, pushes fresh vegetables and fruits, avoids processed foods, cooks at home most of the time.  Wt Readings from Last 3 Encounters:  12/15/18 154 lb (69.9 kg)  10/26/18 150 lb (68 kg)  10/13/18 152 lb 6.4 oz (69.1 kg)   His blood pressure has been controlled at home, today their BP is BP: 95/62 He does not workout. He denies chest pain, shortness of breath (none if he stays on O2) dizziness.   He is not on cholesterol medication and denies myalgias. His cholesterol is not at goal. The cholesterol last visit was:   Lab Results  Component Value Date   CHOL 103 09/08/2018   HDL 31 (L) 09/08/2018   LDLCALC 55 09/08/2018   TRIG 83 09/08/2018   CHOLHDL 3.3 09/08/2018   He has not been working on diet and exercise for prediabetes, and denies increased appetite, nausea, paresthesia of the feet,  polydipsia, polyuria and visual disturbances. Last A1C in the office was:  Lab Results  Component Value Date   HGBA1C 5.9 (H) 09/08/2018   Last GFR Lab Results  Component Value Date   GFRNONAA 92 09/08/2018    Patient is on Vitamin D supplement and at goal at recent check:    Lab Results  Component Value Date   VD25OH 65 09/08/2018      Medication Review:    Current Outpatient Medications (Respiratory):  .  albuterol (PROVENTIL HFA;VENTOLIN HFA) 108 (90 Base) MCG/ACT inhaler, Inhale 1 to 2 puffs 4 x day or every 4 hours to rescue asthma (Patient taking differently: Inhale 1-2 puffs into the lungs every 6 (six) hours as needed for wheezing or shortness of breath. ) .  diphenhydrAMINE (BENADRYL) 25 MG tablet, Take 50 mg by mouth daily.  .  fluticasone (FLONASE) 50 MCG/ACT nasal spray, Place 2 sprays into both nostrils daily. (Patient taking differently: Place 2 sprays into both nostrils every evening. ) .  formoterol (PERFOROMIST) 20 MCG/2ML nebulizer solution, Take 2 mLs (20 mcg total) by nebulization 2 (two) times daily. (Patient taking differently: Take 20 mcg by nebulization as needed. ) .  guaifenesin (HUMIBID E) 400 MG TABS tablet, Take 800 mg by mouth daily.  Marland Kitchen  ipratropium (ATROVENT) 0.06 % nasal spray, Use 1 to 2 sprays each nostril 2  to 3 x /day (Patient taking differently: Place 1-2 sprays into both nostrils every evening. ) .  Revefenacin (YUPELRI) 175 MCG/3ML SOLN, Inhale 1 vial into the lungs daily. (Patient not taking: Reported on 12/15/2018)  Current Outpatient Medications (Analgesics):  .  aspirin 81 MG tablet, Take 81 mg by mouth every evening.    Current Outpatient Medications (Other):  .  b complex vitamins tablet, Take 1 tablet by mouth every evening.  .  Cholecalciferol (VITAMIN D3) 5000 units CAPS, Take 10,000 Units by mouth daily.  .  traZODone (DESYREL) 150 MG tablet, TAKE 1/3 TO 1/2 TO 1 TABLET BY MOUTH 1 HOUR BEFORE SLEEP .  trolamine salicylate  (ASPERCREME) 10 % cream, Apply 1 application topically as needed for muscle pain. Marland Kitchen  zinc gluconate 50 MG tablet, Take 50 mg by mouth daily.  Allergies: Allergies  Allergen Reactions  . Niacin And Related Other (See Comments)    Unknown Childhood reaction.    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  Screening Tests Immunization History  Administered Date(s) Administered  . Influenza Split 06/20/2013, 06/20/2014, 06/28/2015  . Influenza, High Dose Seasonal PF 06/17/2017, 05/24/2018, 06/14/2018  . Influenza,inj,quad, With Preservative 07/21/2016  . Influenza-Unspecified 06/17/2017  . PPD Test 06/20/2014, 06/28/2015, 07/21/2016  . Pneumococcal Conjugate-13 06/20/2014  . Pneumococcal Polysaccharide-23 04/30/2009, 08/12/2017  . Tdap 09/01/2009    Preventative care: Last colonoscopy: 10/2018, due 2025  Prior vaccinations: TD or Tdap: 2011  Influenza: 2019  Pneumococcal: 2018 Prevnar13: 2015 Shingles/Zostavax: ? Had in 2017, unsure  Names of Other Physician/Practitioners you currently use: 1. [facility] here for primary care 2. Dr. Quay Burow, eye doctor, last visit 2019 3.  dentist, last visit remotely, has full denture  Patient Care Team: [name], MD as PCP - General (Internal Medicine) Ladene Artist, MD as Consulting Physician (Gastroenterology) Garner Nash, DO as Consulting Physician (Pulmonary Disease)  Surgical: He  has a past surgical history that includes Testicle  surgery; Excision basal cell carcinoma (2008); and Colonoscopy with propofol (N/A, 10/26/2018). Family His family history includes Breast cancer in his mother; Stomach cancer in his mother. Social history  He reports that he quit smoking about 3 months ago. His smoking use included cigars and cigarettes. He started smoking about 50 years ago. He has a 98.00 pack-year smoking history. He has never used smokeless tobacco. He reports that he does not drink alcohol or use drugs.  MEDICARE WELLNESS OBJECTIVES: Physical activity: Current Exercise Habits: The patient does not participate in regular exercise at present, Exercise limited by: respiratory conditions(s)(motivation ) Cardiac risk factors: Cardiac Risk Factors include: sedentary lifestyle;smoking/ tobacco exposure;advanced age (>35men, >29 women);hypertension;male gender Depression/mood screen:   Depression screen [facility] 2/9 12/15/2018  Decreased Interest 0  Down, Depressed, Hopeless 0  PHQ - 2 Score 0    ADLs:  In your present state of health, do you have any difficulty performing the following activities: 12/15/2018 09/08/2018  Hearing? N N  Vision? N N  Difficulty concentrating or making decisions? N N  Walking or climbing stairs? N N  Dressing or bathing? N N  Doing errands, shopping? N N  Preparing Food and eating ? N -  Using the Toilet? N -  In the past six months, have you accidently leaked urine? N -  Do you have problems with loss of bowel control? N -  Managing your Medications? N -  Managing your Finances? N -  Housekeeping or managing your Housekeeping? N -  Some recent data might be hidden     Cognitive Testing  Alert? Yes  Normal Appearance?Yes  Oriented to person? Yes  Place? Yes   Time? Yes  Recall of three objects?  Yes  Can perform simple calculations? Yes  Displays appropriate judgment?Yes  Can read the correct time from a watch face?Yes  EOL planning: Does Patient Have a Medical Advance Directive?: No Would  patient like information on creating a medical advance directive?: No - Patient declined   Objective:   Today's Vitals   12/15/18 0841  BP: 95/62  Pulse: 83  Temp: (!) 96.5 F (35.8 C)  Weight: 154 lb (69.9 kg)   Body mass index is 26.43 kg/m.  General : Well sounding patient in no apparent distress HEENT: no hoarseness, no cough for duration of visit Lungs: speaks in complete sentences, no audible wheezing, no apparent distress Neurological: alert, oriented x 3 Psychiatric: pleasant, judgement appropriate    Medicare Attestation I have personally reviewed: The patient's medical and social history Their use of alcohol, tobacco or illicit drugs Their current medications and supplements The patient's functional ability including ADLs,fall risks, home safety risks, cognitive, and hearing and visual impairment Diet and physical activities Evidence for depression or mood disorders  The patient's weight, height, BMI, and visual acuity have been recorded in the chart.  I have made referrals, counseling, and provided education to the patient based on review of the above and I have provided the patient with a written personalized care plan for preventive services.     Izora Ribas, NP   12/15/2018

## 2018-12-15 ENCOUNTER — Ambulatory Visit: Payer: Medicare Other | Admitting: Adult Health

## 2018-12-15 ENCOUNTER — Encounter: Payer: Self-pay | Admitting: Adult Health

## 2018-12-15 ENCOUNTER — Other Ambulatory Visit: Payer: Self-pay

## 2018-12-15 VITALS — BP 95/62 | HR 83 | Temp 96.5°F | Wt 154.0 lb

## 2018-12-15 DIAGNOSIS — R7309 Other abnormal glucose: Secondary | ICD-10-CM

## 2018-12-15 DIAGNOSIS — I2510 Atherosclerotic heart disease of native coronary artery without angina pectoris: Secondary | ICD-10-CM | POA: Diagnosis not present

## 2018-12-15 DIAGNOSIS — F17200 Nicotine dependence, unspecified, uncomplicated: Secondary | ICD-10-CM | POA: Diagnosis not present

## 2018-12-15 DIAGNOSIS — E782 Mixed hyperlipidemia: Secondary | ICD-10-CM

## 2018-12-15 DIAGNOSIS — J9611 Chronic respiratory failure with hypoxia: Secondary | ICD-10-CM

## 2018-12-15 DIAGNOSIS — Z0000 Encounter for general adult medical examination without abnormal findings: Secondary | ICD-10-CM

## 2018-12-15 DIAGNOSIS — E559 Vitamin D deficiency, unspecified: Secondary | ICD-10-CM

## 2018-12-15 DIAGNOSIS — Z86010 Personal history of colonic polyps: Secondary | ICD-10-CM

## 2018-12-15 DIAGNOSIS — M25512 Pain in left shoulder: Secondary | ICD-10-CM

## 2018-12-15 DIAGNOSIS — I2584 Coronary atherosclerosis due to calcified coronary lesion: Secondary | ICD-10-CM

## 2018-12-15 DIAGNOSIS — Z0001 Encounter for general adult medical examination with abnormal findings: Secondary | ICD-10-CM | POA: Diagnosis not present

## 2018-12-15 DIAGNOSIS — J449 Chronic obstructive pulmonary disease, unspecified: Secondary | ICD-10-CM | POA: Diagnosis not present

## 2018-12-15 DIAGNOSIS — R6889 Other general symptoms and signs: Secondary | ICD-10-CM

## 2018-12-15 DIAGNOSIS — D751 Secondary polycythemia: Secondary | ICD-10-CM | POA: Diagnosis not present

## 2018-12-15 DIAGNOSIS — I700 Atherosclerosis of aorta: Secondary | ICD-10-CM | POA: Diagnosis not present

## 2018-12-15 DIAGNOSIS — E663 Overweight: Secondary | ICD-10-CM

## 2018-12-15 DIAGNOSIS — K219 Gastro-esophageal reflux disease without esophagitis: Secondary | ICD-10-CM

## 2018-12-15 DIAGNOSIS — K589 Irritable bowel syndrome without diarrhea: Secondary | ICD-10-CM

## 2018-12-15 DIAGNOSIS — R5383 Other fatigue: Secondary | ICD-10-CM

## 2018-12-15 DIAGNOSIS — R0989 Other specified symptoms and signs involving the circulatory and respiratory systems: Secondary | ICD-10-CM

## 2018-12-15 DIAGNOSIS — Z79899 Other long term (current) drug therapy: Secondary | ICD-10-CM | POA: Diagnosis not present

## 2019-02-09 ENCOUNTER — Other Ambulatory Visit: Payer: Self-pay | Admitting: Internal Medicine

## 2019-02-09 DIAGNOSIS — J9611 Chronic respiratory failure with hypoxia: Secondary | ICD-10-CM

## 2019-02-09 DIAGNOSIS — J449 Chronic obstructive pulmonary disease, unspecified: Secondary | ICD-10-CM

## 2019-02-09 MED ORDER — FORMOTEROL FUMARATE 20 MCG/2ML IN NEBU: INHALATION_SOLUTION | RESPIRATORY_TRACT | 3 refills | Status: DC

## 2019-02-16 ENCOUNTER — Other Ambulatory Visit: Payer: Self-pay | Admitting: Internal Medicine

## 2019-02-16 DIAGNOSIS — J9611 Chronic respiratory failure with hypoxia: Secondary | ICD-10-CM

## 2019-02-16 DIAGNOSIS — J449 Chronic obstructive pulmonary disease, unspecified: Secondary | ICD-10-CM

## 2019-02-16 MED ORDER — PERFOROMIST 20 MCG/2ML IN NEBU: INHALATION_SOLUTION | RESPIRATORY_TRACT | 3 refills | Status: DC

## 2019-02-18 ENCOUNTER — Ambulatory Visit: Payer: BLUE CROSS/BLUE SHIELD | Admitting: Hematology & Oncology

## 2019-02-18 ENCOUNTER — Other Ambulatory Visit: Payer: BLUE CROSS/BLUE SHIELD

## 2019-02-21 ENCOUNTER — Other Ambulatory Visit: Payer: Self-pay | Admitting: Internal Medicine

## 2019-02-21 ENCOUNTER — Other Ambulatory Visit: Payer: Self-pay | Admitting: *Deleted

## 2019-02-21 DIAGNOSIS — J9611 Chronic respiratory failure with hypoxia: Secondary | ICD-10-CM

## 2019-02-21 DIAGNOSIS — J449 Chronic obstructive pulmonary disease, unspecified: Secondary | ICD-10-CM

## 2019-02-21 MED ORDER — PERFOROMIST 20 MCG/2ML IN NEBU: INHALATION_SOLUTION | RESPIRATORY_TRACT | 3 refills | Status: DC

## 2019-03-23 ENCOUNTER — Other Ambulatory Visit: Payer: Self-pay

## 2019-03-23 ENCOUNTER — Ambulatory Visit (INDEPENDENT_AMBULATORY_CARE_PROVIDER_SITE_OTHER): Payer: Medicare Other | Admitting: Internal Medicine

## 2019-03-23 ENCOUNTER — Encounter: Payer: Self-pay | Admitting: Internal Medicine

## 2019-03-23 VITALS — BP 122/70 | HR 84 | Temp 97.2°F | Resp 18 | Ht 65.0 in | Wt 155.0 lb

## 2019-03-23 DIAGNOSIS — R7303 Prediabetes: Secondary | ICD-10-CM

## 2019-03-23 DIAGNOSIS — I10 Essential (primary) hypertension: Secondary | ICD-10-CM | POA: Diagnosis not present

## 2019-03-23 DIAGNOSIS — I2510 Atherosclerotic heart disease of native coronary artery without angina pectoris: Secondary | ICD-10-CM | POA: Diagnosis not present

## 2019-03-23 DIAGNOSIS — R7309 Other abnormal glucose: Secondary | ICD-10-CM

## 2019-03-23 DIAGNOSIS — E782 Mixed hyperlipidemia: Secondary | ICD-10-CM

## 2019-03-23 DIAGNOSIS — J449 Chronic obstructive pulmonary disease, unspecified: Secondary | ICD-10-CM | POA: Diagnosis not present

## 2019-03-23 DIAGNOSIS — E559 Vitamin D deficiency, unspecified: Secondary | ICD-10-CM

## 2019-03-23 DIAGNOSIS — I2584 Coronary atherosclerosis due to calcified coronary lesion: Secondary | ICD-10-CM

## 2019-03-23 DIAGNOSIS — Z79899 Other long term (current) drug therapy: Secondary | ICD-10-CM

## 2019-03-23 DIAGNOSIS — J9611 Chronic respiratory failure with hypoxia: Secondary | ICD-10-CM

## 2019-03-23 MED ORDER — IPRATROPIUM BROMIDE 0.06 % NA SOLN: NASAL | 3 refills | Status: DC

## 2019-03-23 NOTE — Patient Instructions (Signed)

## 2019-03-23 NOTE — Progress Notes (Signed)
History of Present Illness:      This very nice 68 y.o. MWM presents for 6 month follow up with HTN, HLD, COPD, Pre-Diabetes and Vitamin D Deficiency. Colonoscopy in Feb by Dr Fuller Plan found Diverticulosis & he recommended f/u in 5 years.       Patient has severe O2 dependent COPD and is also followed by Dr Valeta Harms. Dr Marin Olp also follows patient for secondary polycythemia.       Patient is treated for HTN (1997) & BP has been controlled at home. Today's BP is at goal - 122/70. Patient has had no complaints of any cardiac type chest pain, palpitations, dyspnea / orthopnea / PND, dizziness, claudication, or dependent edema.      Hyperlipidemia is controlled with diet. Last Lipids were at goal: Lab Results  Component Value Date   CHOL 103 09/08/2018   HDL 31 (L) 09/08/2018   LDLCALC 55 09/08/2018   TRIG 83 09/08/2018   CHOLHDL 3.3 09/08/2018       Also, the patient has history of PreDiabetes (A1c 6.0% / 2010) and has had no symptoms of reactive hypoglycemia, diabetic polys, paresthesias or visual blurring.  Last A1c was not at goal: Lab Results  Component Value Date   HGBA1C 5.9 (H) 09/08/2018       Further, the patient also has history of Vitamin D Deficiency (A1c 6.0% / 2010) and supplements vitamin D without any suspected side-effects. Last vitamin D was at goal: Lab Results  Component Value Date   VD25OH 65 09/08/2018   Current Outpatient Medications on File Prior to Visit  Medication Sig  . aspirin 81 MG tablet Take 81 mg by mouth every evening.   . Cholecalciferol (VITAMIN D3) 5000 units CAPS Take 10,000 Units by mouth daily.   . diphenhydrAMINE (BENADRYL) 25 MG tablet Take 50 mg by mouth daily.   . fluticasone (FLONASE) 50 MCG/ACT nasal spray Place 2 sprays into both nostrils daily. (Patient taking differently: Place 2 sprays into both nostrils every evening. )  . formoterol (PERFOROMIST) 20 MCG/2ML nebulizer solution Use 2 mls by Nebulizer 2 x /day  . guaifenesin (HUMIBID E)  400 MG TABS tablet Take 800 mg by mouth daily.   . traZODone (DESYREL) 150 MG tablet TAKE 1/3 TO 1/2 TO 1 TABLET BY MOUTH 1 HOUR BEFORE SLEEP  . trolamine salicylate (ASPERCREME) 10 % cream Apply 1 application topically as needed for muscle pain.  Marland Kitchen zinc gluconate 50 MG tablet Take 50 mg by mouth daily.  Marland Kitchen albuterol (PROVENTIL HFA;VENTOLIN HFA) 108 (90 Base) MCG/ACT inhaler Inhale 1 to 2 puffs 4 x day or every 4 hours to rescue asthma (Patient taking differently: Inhale 1-2 puffs into the lungs every 6 (six) hours as needed for wheezing or shortness of breath. )  . b complex vitamins tablet Take 1 tablet by mouth every evening.    No current facility-administered medications on file prior to visit.    Allergies  Allergen Reactions  . Niacin And Related Other (See Comments)    Unknown Childhood reaction.   PMHx:   Past Medical History:  Diagnosis Date  . COPD (chronic obstructive pulmonary disease) ([place])   . GERD (gastroesophageal reflux disease)   . History of elevated lipids   . IBS (irritable bowel syndrome)   . Labile hypertension   . Prediabetes   . Tubular adenoma of colon   . Vitamin D deficiency    Immunization History  Administered Date(s) Administered  . Influenza Split 06/20/2013, 06/20/2014, 06/28/2015  .  Influenza, High Dose Seasonal PF 06/17/2017, 05/24/2018, 06/14/2018  . Influenza,inj,quad, With Preservative 07/21/2016  . Influenza-Unspecified 06/17/2017  . PPD Test 06/20/2014, 06/28/2015, 07/21/2016  . Pneumococcal Conjugate-13 06/20/2014  . Pneumococcal Polysaccharide-23 04/30/2009, 08/12/2017  . Tdap 09/01/2009   Past Surgical History:  Procedure Laterality Date  . BASAL CELL CARCINOMA EXCISION  2008   Left [place]  . COLONOSCOPY WITH PROPOFOL N/A 10/26/2018   Procedure: COLONOSCOPY WITH PROPOFOL;  Surgeon: Ladene Artist, MD;  Location: [facility];  Service: Endoscopy;  Laterality: N/A;  . TESTICLE SURGERY     testicle removed   FHx:    Reviewed /  unchanged  SHx:    Reviewed / unchanged   Systems Review:  Constitutional: Denies fever, chills, wt changes, headaches, insomnia, fatigue, night sweats, change in appetite. Eyes: Denies redness, blurred vision, diplopia, discharge, itchy, watery eyes.  ENT: Denies discharge, congestion, post nasal drip, epistaxis, sore throat, earache, hearing loss, dental pain, tinnitus, vertigo, sinus pain, snoring.  CV: Denies chest pain, palpitations, irregular heartbeat, syncope, dyspnea, diaphoresis, orthopnea, PND, claudication or edema. Respiratory: denies cough, dyspnea, DOE, pleurisy, hoarseness, laryngitis, wheezing.  Gastrointestinal: Denies dysphagia, odynophagia, heartburn, reflux, water brash, abdominal pain or cramps, nausea, vomiting, bloating, diarrhea, constipation, hematemesis, melena, hematochezia  or hemorrhoids. Genitourinary: Denies dysuria, frequency, urgency, nocturia, hesitancy, discharge, hematuria or flank pain. Musculoskeletal: Denies arthralgias, myalgias, stiffness, jt. swelling, pain, limping or strain/sprain.  Skin: Denies pruritus, rash, hives, warts, acne, eczema or change in skin lesion(s). Neuro: No weakness, tremor, incoordination, spasms, paresthesia or pain. Psychiatric: Denies confusion, memory loss or sensory loss. Endo: Denies change in weight, skin or hair change.  Heme/Lymph: No excessive bleeding, bruising or enlarged lymph nodes.  Physical Exam  BP 122/70   Pulse 84   Temp (!) 97.2 F (36.2 C)   Resp 18   Ht 5\' 5"  (1.651 m)   Wt 155 lb (70.3 kg)   SpO2 (!) 84%   BMI 25.79 kg/m   Appears  well nourished, well groomed  and in no distress.  Eyes: PERRLA, EOMs, conjunctiva no swelling or erythema. Sinuses: No frontal/maxillary tenderness ENT/Mouth: EAC's clear, TM's nl w/o erythema, bulging. Nares clear w/o erythema, swelling, exudates. Oropharynx clear without erythema or exudates. Oral hygiene is good. Tongue normal, non obstructing. Hearing intact.   Neck: Supple. Thyroid not palpable. Car 2+/2+ without bruits, nodes or JVD. Chest: Respirations nl with BS clear & equal w/o rales, rhonchi, wheezing or stridor.  Cor: Heart sounds normal w/ regular rate and rhythm without sig. murmurs, gallops, clicks or rubs. Peripheral pulses normal and equal  without edema.  Abdomen: Soft & bowel sounds normal. Non-tender w/o guarding, rebound, hernias, masses or organomegaly.  Lymphatics: Unremarkable.  Musculoskeletal: Full ROM all peripheral extremities, joint stability, 5/5 strength and normal gait.  Skin: Warm, dry without exposed rashes, lesions or ecchymosis apparent.  Neuro: Cranial nerves intact, reflexes equal bilaterally. Sensory-motor testing grossly intact. Tendon reflexes grossly intact.  Pysch: Alert & oriented x 3.  Insight and judgement nl & appropriate. No ideations.  Assessment and Plan:  1. Essential hypertension  - Continue medication, monitor blood pressure at home.  - Continue DASH diet.  Reminder to go to the ER if any CP,  SOB, nausea, dizziness, severe HA, changes vision/speech.  - CBC with Differential/Platelet - COMPLETE METABOLIC PANEL WITH GFR - Magnesium - TSH  2. Hyperlipidemia, mixed  - Continue diet/meds, exercise,& lifestyle modifications.  - Continue monitor periodic cholesterol/liver & renal functions   - Lipid panel -  TSH  3. Abnormal glucose  - Continue diet, exercise  - Lifestyle modifications.  - Monitor appropriate labs.  - Hemoglobin A1c - Insulin, random  4. Vitamin D deficiency  - Continue supplementation.  - VITAMIN D 25 Hydroxyl  5. Prediabetes  - Hemoglobin A1c - Insulin, random  6. Coronary atherosclerosis due to calcified coronary lesion  - Lipid panel  7. 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[place])  8. Chronic hypoxemic respiratory failure (HCC)  9. Medication management  - CBC with Differential/Platelet - COMPLETE METABOLIC PANEL WITH GFR - Magnesium - Lipid panel - TSH -  Hemoglobin A1c - Insulin, random - VITAMIN D 25 Hydroxyl       Discussed  regular exercise, BP monitoring, weight control to achieve/maintain BMI less than 25 and discussed med and SE's. Recommended labs to assess and monitor clinical status with further disposition pending results of labs.  I discussed the assessment and treatment plan with the patient. The patient was provided an opportunity to ask questions and all were answered. The patient agreed with the plan and demonstrated an understanding of the instructions.  I provided over 30 minutes of exam, counseling, chart review and  complex critical decision making.         The patient was advised to call back or seek an in-person evaluation if the symptoms worsen or if the condition fails to improve as anticipated.   Kirtland Bouchard, MD

## 2019-03-24 LAB — COMPLETE METABOLIC PANEL WITH GFR
AG Ratio: 1.7 (calc) (ref 1.0–2.5)
ALT: 16 U/L (ref 9–46)
AST: 15 U/L (ref 10–35)
Albumin: 4.5 g/dL (ref 3.6–5.1)
Alkaline phosphatase (APISO): 76 U/L (ref 35–144)
BUN: 21 mg/dL (ref 7–25)
CO2: 34 mmol/L — ABNORMAL HIGH (ref 20–32)
Calcium: 9.9 mg/dL (ref 8.6–10.3)
Chloride: 97 mmol/L — ABNORMAL LOW (ref 98–110)
Creat: 1.07 mg/dL (ref 0.70–1.25)
GFR, Est African American: 83 mL/min/{1.73_m2} (ref >=60)
GFR, Est Non African American: 71 mL/min/{1.73_m2} (ref >=60)
Globulin: 2.6 g/dL (calc) (ref 1.9–3.7)
Glucose, Bld: 114 mg/dL — ABNORMAL HIGH (ref 65–99)
Potassium: 5.7 mmol/L — ABNORMAL HIGH (ref 3.5–5.3)
Sodium: 139 mmol/L (ref 135–146)
Total Bilirubin: 0.6 mg/dL (ref 0.2–1.2)
Total Protein: 7.1 g/dL (ref 6.1–8.1)

## 2019-03-24 LAB — CBC WITH DIFFERENTIAL/PLATELET
Absolute Monocytes: 673 cells/uL (ref 200–950)
Basophils Absolute: 29 cells/uL (ref 0–200)
Basophils Relative: 0.5 %
Eosinophils Absolute: 52 cells/uL (ref 15–500)
Eosinophils Relative: 0.9 %
HCT: 47.8 % (ref 38.5–50.0)
Hemoglobin: 16.1 g/dL (ref 13.2–17.1)
Lymphs Abs: 1409 cells/uL (ref 850–3900)
MCH: 32.1 pg (ref 27.0–33.0)
MCHC: 33.7 g/dL (ref 32.0–36.0)
MCV: 95.4 fL (ref 80.0–100.0)
MPV: 10.5 fL (ref 7.5–12.5)
Monocytes Relative: 11.6 %
Neutro Abs: 3637 cells/uL (ref 1500–7800)
Neutrophils Relative %: 62.7 %
Platelets: 187 10*3/uL (ref 140–400)
RBC: 5.01 10*6/uL (ref 4.20–5.80)
RDW: 11.8 % (ref 11.0–15.0)
Total Lymphocyte: 24.3 %
WBC: 5.8 10*3/uL (ref 3.8–10.8)

## 2019-03-24 LAB — LIPID PANEL
Cholesterol: 167 mg/dL (ref <200)
HDL: 44 mg/dL (ref >=40)
LDL Cholesterol (Calc): 103 mg/dL (calc) — ABNORMAL HIGH
Non-HDL Cholesterol (Calc): 123 mg/dL (calc) (ref <130)
Total CHOL/HDL Ratio: 3.8 (calc) (ref <5.0)
Triglycerides: 106 mg/dL (ref <150)

## 2019-03-24 LAB — HEMOGLOBIN A1C
Hgb A1c MFr Bld: 5.5 % of total Hgb (ref <5.7)
Mean Plasma Glucose: 111 (calc)
eAG (mmol/L): 6.2 (calc)

## 2019-03-24 LAB — VITAMIN D 25 HYDROXY (VIT D DEFICIENCY, FRACTURES): Vit D, 25-Hydroxy: 60 ng/mL (ref 30–100)

## 2019-03-24 LAB — INSULIN, RANDOM: Insulin: 27.9 u[IU]/mL — ABNORMAL HIGH

## 2019-03-24 LAB — TSH: TSH: 1.56 mIU/L (ref 0.40–4.50)

## 2019-03-24 LAB — MAGNESIUM: Magnesium: 2 mg/dL (ref 1.5–2.5)

## 2019-03-26 ENCOUNTER — Encounter: Payer: Self-pay | Admitting: Internal Medicine

## 2019-05-03 ENCOUNTER — Telehealth: Payer: Self-pay | Admitting: *Deleted

## 2019-05-03 NOTE — Telephone Encounter (Signed)
RX for oxygen supplies and last OV note faxed to [employer] at [phone].

## 2019-05-25 ENCOUNTER — Other Ambulatory Visit: Payer: Self-pay | Admitting: Internal Medicine

## 2019-05-25 DIAGNOSIS — G4700 Insomnia, unspecified: Secondary | ICD-10-CM

## 2019-05-25 MED ORDER — TRAZODONE HCL 150 MG PO TABS: ORAL_TABLET | ORAL | 3 refills | Status: DC

## 2019-05-26 ENCOUNTER — Other Ambulatory Visit: Payer: Self-pay | Admitting: Internal Medicine

## 2019-05-26 DIAGNOSIS — G4700 Insomnia, unspecified: Secondary | ICD-10-CM

## 2019-05-26 MED ORDER — TRAZODONE HCL 150 MG PO TABS: ORAL_TABLET | ORAL | 3 refills | Status: DC

## 2019-06-28 NOTE — Progress Notes (Signed)
FOLLOW UP Assessment:    Labile hypertension Controlled off of medications Monitor blood pressure at home; call if consistently over 130/80 Continue DASH diet.   Reminder to go to the ER if any CP, SOB, nausea, dizziness, severe HA, changes vision/speech, left arm numbness and tingling and jaw pain.  Aortic atherosclerosis (HCC) Control blood pressure, cholesterol, glucose, increase exercise.   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[place]) Followed by pulmonology; continue neb/inhalers; O2; counseled to stop smoking  Chronic hypoxemic respiratory failure ([place]) On O2; monitor   Smoker Rare; neg CT screening 2020  Secondary polycythemia CBC; monitor; follow up Dr. Marin Olp PRN  Medication management  Hyperlipidemia Continue low cholesterol diet and exercise.  Check lipid panel.     Over 30 minutes of exam, counseling, chart review, and critical decision making was performed  Future Appointments  Date Time Provider [place]  06/30/2019 10:30 AM Icard, Octavio Graves, DO [facility] None  09/29/2019 10:00 AM [name], MD [facility] None  12/29/2019  9:00 AM Liane Comber, NP [facility] None     Subjective:  Douglas Barrera is a 68 y.o. male who presents for 3 month follow up for HTN, hyperlipidemia, prediabetes, and vitamin D Def.   Patient has severe COPD consequent of years of smoking, controlled with MDI  & nebulized meds. He has appointment with Dr. Valeta Harms (Pulmonary). He has a cough but denies worsening DOE, SOB, swelling, mucus.   Follows with Hematology (Dr Marin Olp)  for secondary polycythemia.  He monitors O2 sats and regulate his O2 flow rate between 3 - 4 l/min to keep his O2 sats in the low 90's range.    He had negative low dose CT screening in 09/2018.   BMI is Body mass index is 26.13 kg/m., he has been working on diet, pushes fresh vegetables and fruits.  Wt Readings from Last 3 Encounters:  06/29/19 157 lb (71.2 kg)  03/23/19 155 lb (70.3 kg)  12/15/18 154 lb  (69.9 kg)   His blood pressure has been controlled at home, today their BP is BP: 124/68 He does not workout. He denies chest pain, shortness of breath (none if he stays on O2), dizziness.   He is not on cholesterol medication and denies myalgias. His cholesterol is not at goal. The cholesterol last visit was:   Lab Results  Component Value Date   CHOL 167 03/23/2019   HDL 44 03/23/2019   LDLCALC 103 (H) 03/23/2019   TRIG 106 03/23/2019   CHOLHDL 3.8 03/23/2019   He has not been working on diet and exercise for prediabetes, and denies increased appetite, nausea, paresthesia of the feet, polydipsia, polyuria and visual disturbances. Last A1C in the office was:  Lab Results  Component Value Date   HGBA1C 5.5 03/23/2019   Last GFR Lab Results  Component Value Date   GFRNONAA 71 03/23/2019    Patient is on Vitamin D supplement and at goal at recent check:    Lab Results  Component Value Date   VD25OH 60 03/23/2019      Medication Review:    Current Outpatient Medications (Respiratory):  .  diphenhydrAMINE (BENADRYL) 25 MG tablet, Take 50 mg by mouth daily.  .  fluticasone (FLONASE) 50 MCG/ACT nasal spray, Place 2 sprays into both nostrils daily. (Patient taking differently: Place 2 sprays into both nostrils every evening. ) .  formoterol (PERFOROMIST) 20 MCG/2ML nebulizer solution, Use 2 mls by Nebulizer 2 x /day .  guaifenesin (HUMIBID E) 400 MG TABS tablet, Take 800 mg  by mouth daily.  Marland Kitchen  ipratropium (ATROVENT) 0.06 % nasal spray, Use 1 to 2 sprays each nostril 2 to 3 x /day as needed .  albuterol (PROVENTIL HFA;VENTOLIN HFA) 108 (90 Base) MCG/ACT inhaler, Inhale 1 to 2 puffs 4 x day or every 4 hours to rescue asthma (Patient taking differently: Inhale 1-2 puffs into the lungs every 6 (six) hours as needed for wheezing or shortness of breath. )  Current Outpatient Medications (Analgesics):  .  aspirin 81 MG tablet, Take 81 mg by mouth every evening.    Current Outpatient  Medications (Other):  .  b complex vitamins tablet, Take 1 tablet by mouth every evening.  .  Cholecalciferol (VITAMIN D3) 5000 units CAPS, Take 10,000 Units by mouth daily.  .  traZODone (DESYREL) 150 MG tablet, Take 1 tablet 1 hour before Bedtime as needed for Sleep .  trolamine salicylate (ASPERCREME) 10 % cream, Apply 1 application topically as needed for muscle pain. Marland Kitchen  zinc gluconate 50 MG tablet, Take 50 mg by mouth daily.  Allergies: Allergies  Allergen Reactions  . Niacin And Related Other (See Comments)    Unknown Childhood reaction.    Current Problems (verified) has 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[place]); Labile hypertension; Hyperlipidemia; GERD (gastroesophageal reflux disease); Other abnormal glucose (prediabetes); IBS (irritable bowel syndrome); Vitamin D deficiency; Medication management; Overweight (BMI 25.0-29.9); Fatigue; Smoker; Aortic atherosclerosis ([place]); Coronary atherosclerosis; Secondary polycythemia; Chronic hypoxemic respiratory failure ([place]); History of adenomatous polyp of colon; and Left shoulder pain on their problem list.  Surgical: He  has a past surgical history that includes Testicle surgery; Excision basal cell carcinoma (2008); and Colonoscopy with propofol (N/A, 10/26/2018). Family His family history includes Breast cancer in his mother; Stomach cancer in his mother. Social history  He reports that he quit smoking about 10 months ago. His smoking use included cigars and cigarettes. He started smoking about 50 years ago. He has a 98.00 pack-year smoking history. He has never used smokeless tobacco. He reports that he does not drink alcohol or use drugs.    Objective:   Today's Vitals   06/29/19 0838  BP: 124/68  Pulse: 75  Temp: 97.7 F (36.5 C)  SpO2: 92%  Weight: 157 lb (71.2 kg)   Body mass index is 26.13 kg/m. General Appearance:  in no apparent distress. Eyes: PERRLA, EOMs, conjunctiva no swelling or erythema Sinuses: No Frontal/maxillary  tenderness ENT/Mouth: Ext aud canals clear, TMs without erythema, bulging. No erythema, swelling, or exudate on post pharynx. Hearing normal.  Neck: Supple, thyroid normal.  Respiratory: Respiratory effort normal, Decreased breath sounds without rales, rhonchi, wheezing or stridor. Patient on [place] with 3 L. Cardio: RRR with no MRGs, distant heart sounds. Brisk peripheral pulses with mild edema bilaterally Abdomen: Soft, + BS.  Non tender, no guarding, rebound, hernias, masses. Lymphatics: Non tender without lymphadenopathy.  Musculoskeletal: Full ROM, 4/5 strength diffuse decreased muscle tone, Normal gait Skin: Warm, dry without rashes, lesions, ecchymosis.  Neuro: Cranial nerves intact. No cerebellar symptoms.  Psych: Awake and oriented X 3, normal affect, Insight and Judgment appropriate.    Vicie Mutters, PA-C   06/29/2019

## 2019-06-29 ENCOUNTER — Ambulatory Visit (INDEPENDENT_AMBULATORY_CARE_PROVIDER_SITE_OTHER): Payer: Medicare Other | Admitting: Physician Assistant

## 2019-06-29 ENCOUNTER — Other Ambulatory Visit: Payer: Self-pay

## 2019-06-29 ENCOUNTER — Encounter: Payer: Self-pay | Admitting: Physician Assistant

## 2019-06-29 VITALS — BP 124/68 | HR 75 | Temp 97.7°F | Wt 157.0 lb

## 2019-06-29 DIAGNOSIS — E782 Mixed hyperlipidemia: Secondary | ICD-10-CM | POA: Diagnosis not present

## 2019-06-29 DIAGNOSIS — R0989 Other specified symptoms and signs involving the circulatory and respiratory systems: Secondary | ICD-10-CM

## 2019-06-29 DIAGNOSIS — R7309 Other abnormal glucose: Secondary | ICD-10-CM | POA: Diagnosis not present

## 2019-06-29 DIAGNOSIS — J9611 Chronic respiratory failure with hypoxia: Secondary | ICD-10-CM

## 2019-06-29 DIAGNOSIS — J449 Chronic obstructive pulmonary disease, unspecified: Secondary | ICD-10-CM

## 2019-06-29 DIAGNOSIS — I700 Atherosclerosis of aorta: Secondary | ICD-10-CM

## 2019-06-29 DIAGNOSIS — E559 Vitamin D deficiency, unspecified: Secondary | ICD-10-CM

## 2019-06-29 DIAGNOSIS — Z79899 Other long term (current) drug therapy: Secondary | ICD-10-CM

## 2019-06-29 DIAGNOSIS — Z23 Encounter for immunization: Secondary | ICD-10-CM

## 2019-06-29 NOTE — Patient Instructions (Signed)

## 2019-06-30 ENCOUNTER — Encounter: Payer: Self-pay | Admitting: Pulmonary Disease

## 2019-06-30 ENCOUNTER — Ambulatory Visit (INDEPENDENT_AMBULATORY_CARE_PROVIDER_SITE_OTHER): Payer: Medicare Other | Admitting: Pulmonary Disease

## 2019-06-30 VITALS — BP 128/84 | HR 86 | Temp 97.6°F | Ht 64.5 in | Wt 158.0 lb

## 2019-06-30 DIAGNOSIS — F17200 Nicotine dependence, unspecified, uncomplicated: Secondary | ICD-10-CM | POA: Diagnosis not present

## 2019-06-30 DIAGNOSIS — D751 Secondary polycythemia: Secondary | ICD-10-CM

## 2019-06-30 DIAGNOSIS — J449 Chronic obstructive pulmonary disease, unspecified: Secondary | ICD-10-CM

## 2019-06-30 DIAGNOSIS — J9611 Chronic respiratory failure with hypoxia: Secondary | ICD-10-CM | POA: Diagnosis not present

## 2019-06-30 LAB — LIPID PANEL
Cholesterol: 158 mg/dL (ref <200)
HDL: 44 mg/dL (ref >=40)
LDL Cholesterol (Calc): 95 mg/dL (calc)
Non-HDL Cholesterol (Calc): 114 mg/dL (calc) (ref <130)
Total CHOL/HDL Ratio: 3.6 (calc) (ref <5.0)
Triglycerides: 96 mg/dL (ref <150)

## 2019-06-30 LAB — COMPLETE METABOLIC PANEL WITH GFR
AG Ratio: 1.8 (calc) (ref 1.0–2.5)
ALT: 16 U/L (ref 9–46)
AST: 14 U/L (ref 10–35)
Albumin: 4.3 g/dL (ref 3.6–5.1)
Alkaline phosphatase (APISO): 60 U/L (ref 35–144)
BUN: 21 mg/dL (ref 7–25)
CO2: 31 mmol/L (ref 20–32)
Calcium: 9.5 mg/dL (ref 8.6–10.3)
Chloride: 98 mmol/L (ref 98–110)
Creat: 1.09 mg/dL (ref 0.70–1.25)
GFR, Est African American: 81 mL/min/{1.73_m2} (ref >=60)
GFR, Est Non African American: 70 mL/min/{1.73_m2} (ref >=60)
Globulin: 2.4 g/dL (calc) (ref 1.9–3.7)
Glucose, Bld: 100 mg/dL — ABNORMAL HIGH (ref 65–99)
Potassium: 4.8 mmol/L (ref 3.5–5.3)
Sodium: 139 mmol/L (ref 135–146)
Total Bilirubin: 0.6 mg/dL (ref 0.2–1.2)
Total Protein: 6.7 g/dL (ref 6.1–8.1)

## 2019-06-30 LAB — CBC WITH DIFFERENTIAL/PLATELET
Absolute Monocytes: 684 cells/uL (ref 200–950)
Basophils Absolute: 18 cells/uL (ref 0–200)
Basophils Relative: 0.3 %
Eosinophils Absolute: 59 cells/uL (ref 15–500)
Eosinophils Relative: 1 %
HCT: 47.6 % (ref 38.5–50.0)
Hemoglobin: 16 g/dL (ref 13.2–17.1)
Lymphs Abs: 1221 cells/uL (ref 850–3900)
MCH: 31.4 pg (ref 27.0–33.0)
MCHC: 33.6 g/dL (ref 32.0–36.0)
MCV: 93.3 fL (ref 80.0–100.0)
MPV: 10.9 fL (ref 7.5–12.5)
Monocytes Relative: 11.6 %
Neutro Abs: 3918 cells/uL (ref 1500–7800)
Neutrophils Relative %: 66.4 %
Platelets: 180 10*3/uL (ref 140–400)
RBC: 5.1 10*6/uL (ref 4.20–5.80)
RDW: 11.6 % (ref 11.0–15.0)
Total Lymphocyte: 20.7 %
WBC: 5.9 10*3/uL (ref 3.8–10.8)

## 2019-06-30 LAB — TSH: TSH: 1.56 mIU/L (ref 0.40–4.50)

## 2019-06-30 LAB — MAGNESIUM: Magnesium: 1.9 mg/dL (ref 1.5–2.5)

## 2019-06-30 NOTE — Progress Notes (Signed)
Synopsis: Referred in September 2019 for severe COPD by [name], MD  Subjective:   PATIENT ID: Douglas Barrera GENDER: male DOB: 1951/02/25, MRN: [id number]  Chief Complaint  Patient presents with  . Follow-up    Using 3L pulsed with activity and 3L continous. Reports his breathing has been at his baseline. Using peroforomist bid and albuterol prn.     He was diagnosed with COPD about 3-4 years ago. He quit smoking a week ago after being placed on oxygen. He feels much better after being placed on O2. He has much more energy after start O2. He has never been hospitalized for an exacerbation. Smoked for 40 years, 1 ppd.  Patient denies weight loss, fevers, chills, night sweats.  He does have sputum production.  He is able to complete most of his activities of daily living.  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  He used to travel around to local bakery's as well as a crossed many states to teach institutions and bakeries how to bake certain tasks.  He is able to go to the grocery store, pushes on buggy as well as mow his grass while riding a lawnmower.  OV 07/07/2018: Patient been doing well since last office visit.  He is trying to obtain POC.  Insurance has required requalification for O2 needs.  Presents today for walking in the office for that.  He has been using his new nebulized medications.  He is not really sure he seen much difference but he does think that his functional status is good.  Today he plans to go home and mow the grass on riding mower.  He is able to go to the grocery store.  He still is able to work in his kitchen and bake.  Overall doing well.  OV 10/13/2018: This is a 68 year old with very severe COPD.  Overall has been doing well for the past several months.  He had a low-dose lung cancer screening CT which revealed coronary calcifications and no evidence or/concern of a underlying malignancy.  This is being done by his primary care provider."  Radiology recommended a one-year  follow-up.  He does have recent evaluation by cardiology that plans a CT a coronary evaluation.  Overall he has been doing well and has no significant complaints today.  He enjoys using his nebulizers.  He plans to pick a cherry pie this weekend for his wife/Valentine's Day.  As for his current respiratory symptoms he maintains with some dyspnea on exertion.  He does have a planned colonoscopy for next week.  OV 06/30/2019: Patient doing very well today he has very severe COPD at baseline.  He was on nebulized LAMA LABA however currently only using Perforomist and as needed duo nebs.  He is stable on 3 L nasal cannula.  Able to complete most of his activities of daily living.  States that he mowed the grass last week with no trouble.  Trying to be as active as possible.  He is planning a trip to go across the country driving.  He would like to attempt to be approved for a POC concentrator that would help his travel significantly.  Having a car adapter would make this travel much simpler.  He will also plan to travel with his oxygen tanks.  He has a SpO2 device that he checks his sats regularly.  Otherwise no significant change in his respiratory symptoms baseline.   Past Medical History:  Diagnosis Date  . COPD (chronic obstructive pulmonary disease) (  HCC)   . GERD (gastroesophageal reflux disease)   . History of elevated lipids   . IBS (irritable bowel syndrome)   . Labile hypertension   . Prediabetes   . Tubular adenoma of colon   . Vitamin D deficiency      Family History  Problem Relation Age of Onset  . Breast cancer Mother   . Stomach cancer Mother      Past Surgical History:  Procedure Laterality Date  . BASAL CELL CARCINOMA EXCISION  2008   Left [place]  . COLONOSCOPY WITH PROPOFOL N/A 10/26/2018   Procedure: COLONOSCOPY WITH PROPOFOL;  Surgeon: Ladene Artist, MD;  Location: [facility];  Service: Endoscopy;  Laterality: N/A;  . TESTICLE SURGERY     testicle removed     Social History   Socioeconomic History  . Marital status: Married    Spouse name: Not on file  . Number of children: 0  . Years of education: Not on file  . Highest education level: Not on file  Occupational History  . Occupation: retired  Scientific laboratory technician  . Financial resource strain: Not on file  . Food insecurity    Worry: Not on file    Inability: Not on file  . Transportation needs    Medical: Not on file    Non-medical: Not on file  Tobacco Use  . Smoking status: Former Smoker    Packs/day: 2.00    Years: 49.00    Pack years: 98.00    Types: Cigars, Cigarettes    Start date: 10/17/1968    Quit date: 08/18/2018    Years since quitting: 0.8  . Smokeless tobacco: Never Used  . Tobacco comment: quit smoking cigarettes in Dec 2019 but do backslide occasional, but smokes 3-4 cigars a week. small sigar on occasion 10/13/18  Substance and Sexual Activity  . Alcohol use: No    Alcohol/week: 0.0 standard drinks  . Drug use: No  . Sexual activity: Not on file  Lifestyle  . Physical activity    Days per week: Not on file    Minutes per session: Not on file  . Stress: Not on file  Relationships  . Social Herbalist on phone: Not on file    Gets together: Not on file    Attends religious service: Not on file    Active member of club or organization: Not on file    Attends meetings of clubs or organizations: Not on file    Relationship status: Not on file  . Intimate partner violence    Fear of current or ex partner: Not on file    Emotionally abused: Not on file    Physically abused: Not on file    Forced sexual activity: Not on file  Other Topics Concern  . Not on file  Social History Narrative  . Not on file     Allergies  Allergen Reactions  . Niacin And Related Other (See Comments)    Unknown Childhood reaction.     Outpatient Medications Prior to Visit  Medication Sig Dispense Refill  . albuterol (PROVENTIL HFA;VENTOLIN HFA) 108 (90 Base) MCG/ACT  inhaler Inhale 1 to 2 puffs 4 x day or every 4 hours to rescue asthma (Patient taking differently: Inhale 1-2 puffs into the lungs every 6 (six) hours as needed for wheezing or shortness of breath. ) 3 Inhaler 3  . aspirin 81 MG tablet Take 81 mg by mouth every evening.     Marland Kitchen  b complex vitamins tablet Take 1 tablet by mouth every evening.     . Cholecalciferol (VITAMIN D3) 5000 units CAPS Take 10,000 Units by mouth daily.     . diphenhydrAMINE (BENADRYL) 25 MG tablet Take 50 mg by mouth daily.     . fluticasone (FLONASE) 50 MCG/ACT nasal spray Place 2 sprays into both nostrils daily. (Patient taking differently: Place 2 sprays into both nostrils every evening. ) 48 g 3  . formoterol (PERFOROMIST) 20 MCG/2ML nebulizer solution Use 2 mls by Nebulizer 2 x /day 360 mL 3  . guaifenesin (HUMIBID E) 400 MG TABS tablet Take 800 mg by mouth daily.     Marland Kitchen ipratropium (ATROVENT) 0.06 % nasal spray Use 1 to 2 sprays each nostril 2 to 3 x /day as needed 45 mL 3  . traZODone (DESYREL) 150 MG tablet Take 1 tablet 1 hour before Bedtime as needed for Sleep 90 tablet 3  . trolamine salicylate (ASPERCREME) 10 % cream Apply 1 application topically as needed for muscle pain.    Marland Kitchen zinc gluconate 50 MG tablet Take 50 mg by mouth daily.     No facility-administered medications prior to visit.     Review of Systems  Constitutional: Negative for chills, fever, malaise/fatigue and weight loss.  HENT: Negative for hearing loss, sore throat and tinnitus.   Eyes: Negative for blurred vision and double vision.  Respiratory: Positive for shortness of breath. Negative for cough, hemoptysis, sputum production, wheezing and stridor.   Cardiovascular: Negative for chest pain, palpitations, orthopnea, leg swelling and PND.  Gastrointestinal: Negative for abdominal pain, constipation, diarrhea, heartburn, nausea and vomiting.  Genitourinary: Negative for dysuria, hematuria and urgency.  Musculoskeletal: Negative for joint pain and  myalgias.  Skin: Negative for itching and rash.  Neurological: Negative for dizziness, tingling, weakness and headaches.  Endo/Heme/Allergies: Negative for environmental allergies. Does not bruise/bleed easily.  Psychiatric/Behavioral: Negative for depression. The patient is not nervous/anxious and does not have insomnia.   All other systems reviewed and are negative.    Objective:  Physical Exam Vitals signs reviewed.  Constitutional:      General: He is not in acute distress.    Appearance: He is well-developed.  HENT:     Head: Normocephalic and atraumatic.  Eyes:     General: No scleral icterus.    Conjunctiva/sclera: Conjunctivae normal.     Pupils: Pupils are equal, round, and reactive to light.  Neck:     Musculoskeletal: Neck supple.     Vascular: No JVD.     Trachea: No tracheal deviation.  Cardiovascular:     Rate and Rhythm: Normal rate and regular rhythm.     Heart sounds: Normal heart sounds. No murmur.  Pulmonary:     Effort: Pulmonary effort is normal. No tachypnea, accessory muscle usage or respiratory distress.     Breath sounds: Normal breath sounds. No stridor. No wheezing, rhonchi or rales.  Abdominal:     General: Bowel sounds are normal. There is no distension.     Palpations: Abdomen is soft.     Tenderness: There is no abdominal tenderness.  Musculoskeletal:        General: No tenderness.  Lymphadenopathy:     Cervical: No cervical adenopathy.  Skin:    General: Skin is warm and dry.     Capillary Refill: Capillary refill takes less than 2 seconds.     Findings: No rash.  Neurological:     Mental Status: He is alert and oriented  to person, place, and time.  Psychiatric:        Behavior: Behavior normal.      Vitals:   06/30/19 1032  BP: 128/84  Pulse: 86  Temp: 97.6 F (36.4 C)  TempSrc: Temporal  SpO2: 97%  Weight: 158 lb (71.7 kg)  Height: 5' 4.5" (1.638 m)   97% on 4 L nasal cannula BMI Readings from Last 3 Encounters:   06/30/19 26.70 kg/m  06/29/19 26.13 kg/m  03/23/19 25.79 kg/m   Wt Readings from Last 3 Encounters:  06/30/19 158 lb (71.7 kg)  06/29/19 157 lb (71.2 kg)  03/23/19 155 lb (70.3 kg)     CBC    Component Value Date/Time   WBC 5.9 06/29/2019 0859   RBC 5.10 06/29/2019 0859   HGB 16.0 06/29/2019 0859   HGB 16.8 08/19/2018 1308   HCT 47.6 06/29/2019 0859   PLT 180 06/29/2019 0859   PLT 194 08/19/2018 1308   MCV 93.3 06/29/2019 0859   MCH 31.4 06/29/2019 0859   MCHC 33.6 06/29/2019 0859   RDW 11.6 06/29/2019 0859   LYMPHSABS 1,221 06/29/2019 0859   MONOABS 1.3 (H) 08/19/2018 1308   EOSABS 59 06/29/2019 0859   BASOSABS 18 06/29/2019 0859    Chest Imaging: 08/26/2017 CT chest lung cancer screening, LD CT Lung-RADS 1 radiology, recommending annual continued screening. Evidence of mild bronchial thickening, scattered areas of groundglass, areas of scarring. The patient's images have been independently reviewed by me.   09/15/2018: LDCT -  LUNG RADS 2S, with coronary calcifications  The patient's images have been independently reviewed by me.    Pulmonary Functions Testing Results: PFT Results Latest Ref Rng & Units 05/31/2018 03/24/2018  FVC-Pre L 1.83 1.72  FVC-Predicted Pre % 48 45  FVC-Post L 1.89 -  FVC-Predicted Post % 50 -  Pre FEV1/FVC % % 46 36  Post FEV1/FCV % % 34 -  FEV1-Pre L 0.84 0.61  FEV1-Predicted Pre % 30 22  FEV1-Post L 0.65 -  DLCO UNC% % 34 27  DLCO COR %Predicted % 52 49  TLC L - 5.85  TLC % Predicted % - 97  RV % Predicted % - 198    FeNO: None   Pathology: None   Echocardiogram: None   Heart Catheterization: None     Assessment & Plan:   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HCC)  Chronic hypoxemic respiratory failure (HCC)  Secondary polycythemia  Smoker  Discussion:  68 year old gentleman very severe COPD last FEV1 22% predicted, chronic hypoxemic respiratory failure on nasal cannula O2 supplementation, currently 3 L nasal cannula continuous.   He also has secondary polycythemia due to above.  Overall functional status mMRC 1.  At this point still remains smoke-free per his report today.  Continue patient on nebulized LAMA and LABA.  Perforomist plus as needed albuterol for shortness of breath and wheezing.  We will attempt to qualify today for POC.  POC would definitely help with his travel plans being able to plug this into his car.  Greater than 50% of this patient's 15-minute of visit was spent face-to-face discussing above recommendations and treatment plan.   Current Outpatient Medications:  .  albuterol (PROVENTIL HFA;VENTOLIN HFA) 108 (90 Base) MCG/ACT inhaler, Inhale 1 to 2 puffs 4 x day or every 4 hours to rescue asthma (Patient taking differently: Inhale 1-2 puffs into the lungs every 6 (six) hours as needed for wheezing or shortness of breath. ), Disp: 3 Inhaler, Rfl: 3 .  aspirin 81 MG  tablet, Take 81 mg by mouth every evening. , Disp: , Rfl:  .  b complex vitamins tablet, Take 1 tablet by mouth every evening. , Disp: , Rfl:  .  Cholecalciferol (VITAMIN D3) 5000 units CAPS, Take 10,000 Units by mouth daily. , Disp: , Rfl:  .  diphenhydrAMINE (BENADRYL) 25 MG tablet, Take 50 mg by mouth daily. , Disp: , Rfl:  .  fluticasone (FLONASE) 50 MCG/ACT nasal spray, Place 2 sprays into both nostrils daily. (Patient taking differently: Place 2 sprays into both nostrils every evening. ), Disp: 48 g, Rfl: 3 .  formoterol (PERFOROMIST) 20 MCG/2ML nebulizer solution, Use 2 mls by Nebulizer 2 x /day, Disp: 360 mL, Rfl: 3 .  guaifenesin (HUMIBID E) 400 MG TABS tablet, Take 800 mg by mouth daily. , Disp: , Rfl:  .  ipratropium (ATROVENT) 0.06 % nasal spray, Use 1 to 2 sprays each nostril 2 to 3 x /day as needed, Disp: 45 mL, Rfl: 3 .  traZODone (DESYREL) 150 MG tablet, Take 1 tablet 1 hour before Bedtime as needed for Sleep, Disp: 90 tablet, Rfl: 3 .  trolamine salicylate (ASPERCREME) 10 % cream, Apply 1 application topically as needed for  muscle pain., Disp: , Rfl:  .  zinc gluconate 50 MG tablet, Take 50 mg by mouth daily., Disp: , Rfl:    Garner Nash, DO [facility] 06/30/2019 10:37 AM

## 2019-06-30 NOTE — Patient Instructions (Addendum)
Thank you for visiting Dr. Valeta Harms at [facility]  Today we recommend the following:   Return in about 6 months (around 12/29/2019).    Please do your part to reduce the spread of COVID-19.

## 2019-09-29 ENCOUNTER — Ambulatory Visit (INDEPENDENT_AMBULATORY_CARE_PROVIDER_SITE_OTHER): Payer: Medicare Other | Admitting: Internal Medicine

## 2019-09-29 ENCOUNTER — Encounter: Payer: Self-pay | Admitting: Internal Medicine

## 2019-09-29 ENCOUNTER — Other Ambulatory Visit: Payer: Self-pay

## 2019-09-29 VITALS — BP 124/64 | HR 96 | Temp 97.2°F | Resp 18 | Ht 65.0 in | Wt 160.6 lb

## 2019-09-29 DIAGNOSIS — Z79899 Other long term (current) drug therapy: Secondary | ICD-10-CM | POA: Diagnosis not present

## 2019-09-29 DIAGNOSIS — E782 Mixed hyperlipidemia: Secondary | ICD-10-CM | POA: Diagnosis not present

## 2019-09-29 DIAGNOSIS — I700 Atherosclerosis of aorta: Secondary | ICD-10-CM

## 2019-09-29 DIAGNOSIS — I2510 Atherosclerotic heart disease of native coronary artery without angina pectoris: Secondary | ICD-10-CM

## 2019-09-29 DIAGNOSIS — J449 Chronic obstructive pulmonary disease, unspecified: Secondary | ICD-10-CM

## 2019-09-29 DIAGNOSIS — Z136 Encounter for screening for cardiovascular disorders: Secondary | ICD-10-CM | POA: Diagnosis not present

## 2019-09-29 DIAGNOSIS — R7309 Other abnormal glucose: Secondary | ICD-10-CM

## 2019-09-29 DIAGNOSIS — Z122 Encounter for screening for malignant neoplasm of respiratory organs: Secondary | ICD-10-CM

## 2019-09-29 DIAGNOSIS — Z1211 Encounter for screening for malignant neoplasm of colon: Secondary | ICD-10-CM

## 2019-09-29 DIAGNOSIS — N401 Enlarged prostate with lower urinary tract symptoms: Secondary | ICD-10-CM | POA: Diagnosis not present

## 2019-09-29 DIAGNOSIS — J9611 Chronic respiratory failure with hypoxia: Secondary | ICD-10-CM

## 2019-09-29 DIAGNOSIS — Z1212 Encounter for screening for malignant neoplasm of rectum: Secondary | ICD-10-CM

## 2019-09-29 DIAGNOSIS — N138 Other obstructive and reflux uropathy: Secondary | ICD-10-CM

## 2019-09-29 DIAGNOSIS — E559 Vitamin D deficiency, unspecified: Secondary | ICD-10-CM

## 2019-09-29 DIAGNOSIS — Z87891 Personal history of nicotine dependence: Secondary | ICD-10-CM | POA: Diagnosis not present

## 2019-09-29 DIAGNOSIS — Z125 Encounter for screening for malignant neoplasm of prostate: Secondary | ICD-10-CM

## 2019-09-29 DIAGNOSIS — R0989 Other specified symptoms and signs involving the circulatory and respiratory systems: Secondary | ICD-10-CM

## 2019-09-29 NOTE — Patient Instructions (Signed)

## 2019-09-29 NOTE — Progress Notes (Signed)
Annual  Screening/Preventative Visit  & Comprehensive Evaluation & Examination     This very nice 69 y.o. MWM presents for a Screening /Preventative Visit & comprehensive evaluation and management of multiple medical co-morbidities.  Patient has been followed for HTN, HLD, T2_NIDDM  Prediabetes and Vitamin D Deficiency.     Patient has severe COPD consequent of 40+ year smoking Hx and was started on O2 in Sept 2019. Patient is also followed closely by Dr Valeta Harms. Last Jan 2020 had a LD screening Chest CT which was negative for cancer. Due to his  long history of smoking over 40+  years,  I discussed lung cancer screening with him . He was agreeable to undergo a screening low dose CT scan of the chest.  We discussed smoking cessation techniques/options. I will refer him her for a LDCT lung scan.      HTN predates circa 1997. Patient's BP has been controlled at home.  Today's BP is at goal -  124/64. Patient denies any cardiac symptoms as chest pain, palpitations, shortness of breath, dizziness or ankle swelling.     Patient's hyperlipidemia is controlled with diet and last lipids were at goal:  Lab Results  Component Value Date   CHOL 158 06/29/2019   HDL 44 06/29/2019   [place] 95 06/29/2019   TRIG 96 06/29/2019   CHOLHDL 3.6 06/29/2019      Patient has hx/o prediabetes (A1c 6.0% / 2010) and patient denies reactive hypoglycemic symptoms, visual blurring, diabetic polys or paresthesias. Last A1c was Normal & at goal:  Lab Results  Component Value Date   HGBA1C 5.5 03/23/2019       Finally, patient has history of Vitamin D Deficiency (A1c 6.0% / 2010)  and last vitamin D was at goal:  Lab Results  Component Value Date   VD25OH 60 03/23/2019   Current Outpatient Medications on File Prior to Visit  Medication Sig  . aspirin 81 MG tablet Take 81 mg by mouth every evening.   Marland Kitchen b complex vitamins tablet Take 1 tablet by mouth every evening.   . Cholecalciferol (VITAMIN D3) 5000 units  CAPS Take 10,000 Units by mouth daily.   . diphenhydrAMINE (BENADRYL) 25 MG tablet Take 50 mg by mouth daily.   . fluticasone (FLONASE) 50 MCG/ACT nasal spray Place 2 sprays into both nostrils daily. (Patient taking differently: Place 2 sprays into both nostrils every evening. )  . formoterol (PERFOROMIST) 20 MCG/2ML nebulizer solution Use 2 mls by Nebulizer 2 x /day  . guaifenesin (HUMIBID E) 400 MG TABS tablet Take 800 mg by mouth daily.   . traZODone (DESYREL) 150 MG tablet Take 1 tablet 1 hour before Bedtime as needed for Sleep  . trolamine salicylate (ASPERCREME) 10 % cream Apply 1 application topically as needed for muscle pain.  Marland Kitchen zinc gluconate 50 MG tablet Take 50 mg by mouth daily.  Marland Kitchen albuterol (PROVENTIL HFA;VENTOLIN HFA) 108 (90 Base) MCG/ACT inhaler Inhale 1 to 2 puffs 4 x day or every 4 hours to rescue asthma (Patient taking differently: Inhale 1-2 puffs into the lungs every 6 (six) hours as needed for wheezing or shortness of breath. )  . ipratropium (ATROVENT) 0.06 % nasal spray Use 1 to 2 sprays each nostril 2 to 3 x /day as needed (Patient not taking: Reported on 09/29/2019)   No current facility-administered medications on file prior to visit.   Allergies  Allergen Reactions  . Niacin And Related Other (See Comments)    Unknown  Childhood reaction.   Past Medical History:  Diagnosis Date  . COPD (chronic obstructive pulmonary disease) ([place])   . GERD (gastroesophageal reflux disease)   . History of elevated lipids   . IBS (irritable bowel syndrome)   . Labile hypertension   . Prediabetes   . Tubular adenoma of colon   . Vitamin D deficiency    Health Maintenance  Topic Date Due  . Hepatitis C Screening  May 14, 1951  . TETANUS/TDAP  09/02/2019  . COLONOSCOPY  10/27/2023  . INFLUENZA VACCINE  Completed  . PNA vac Low Risk Adult  Completed   Immunization History  Administered Date(s) Administered  . Influenza Split 06/20/2013, 06/20/2014, 06/28/2015  . Influenza,  High Dose Seasonal PF 06/17/2017, 05/24/2018, 06/14/2018, 06/29/2019  . Influenza,inj,quad, With Preservative 07/21/2016  . Influenza-Unspecified 06/17/2017  . PPD Test 06/20/2014, 06/28/2015, 07/21/2016  . Pneumococcal Conjugate-13 06/20/2014  . Pneumococcal Polysaccharide-23 04/30/2009, 08/12/2017  . Tdap 09/01/2009   Last Colon - 10/26/2018 - Dr Fuller Plan - Recc - 5 year f/u due Mar 2025  Past Surgical History:  Procedure Laterality Date  . BASAL CELL CARCINOMA EXCISION  2008   Left [place]  . COLONOSCOPY WITH PROPOFOL N/A 10/26/2018   Procedure: COLONOSCOPY WITH PROPOFOL;  Surgeon: Ladene Artist, MD;  Location: [facility];  Service: Endoscopy;  Laterality: N/A;  . TESTICLE SURGERY     testicle removed   Family History  Problem Relation Age of Onset  . Breast cancer Mother   . Stomach cancer Mother    Social History   Socioeconomic History  . Marital status: Married    Spouse name: [place] [employer] laboratory technician)  . Number of children: 0  Occupational History  . Occupation: retired  Tobacco Use  . Smoking status: Former Smoker    Packs/day: 2.00    Years: 49.00    Pack years: 98.00    Types: Cigars, Cigarettes    Start date: 10/17/1968    Quit date: 08/18/2018    Years since quitting: 1.1  . Smokeless tobacco: Never Used  . Tobacco comment: quit smoking cigarettes in Dec 2019 but do backslide occasional, but smokes 3-4 cigars a week. small cigar on occasion 10/13/18  Substance and Sexual Activity  . Alcohol use: No  . Drug use: No  . Sexual activity: Not on file     ROS Constitutional: Denies fever, chills, weight loss/gain, headaches, insomnia,  night sweats or change in appetite. Does c/o fatigue. Eyes: Denies redness, blurred vision, diplopia, discharge, itchy or watery eyes.  ENT: Denies discharge, congestion, post nasal drip, epistaxis, sore throat, earache, hearing loss, dental pain, Tinnitus, Vertigo, Sinus pain or snoring.  Cardio: Denies chest pain, palpitations, irregular  heartbeat, syncope, dyspnea, diaphoresis, orthopnea, PND, claudication or edema Respiratory: denies cough, dyspnea, DOE, pleurisy, hoarseness, laryngitis or wheezing.  Gastrointestinal: Denies dysphagia, heartburn, reflux, water brash, pain, cramps, nausea, vomiting, bloating, diarrhea, constipation, hematemesis, melena, hematochezia, jaundice or hemorrhoids Genitourinary: Denies dysuria, frequency, urgency, nocturia, hesitancy, discharge, hematuria or flank pain Musculoskeletal: Denies arthralgia, myalgia, stiffness, Jt. Swelling, pain, limp or strain/sprain. Denies Falls. Skin: Denies puritis, rash, hives, warts, acne, eczema or change in skin lesion Neuro: No weakness, tremor, incoordination, spasms, paresthesia or pain Psychiatric: Denies confusion, memory loss or sensory loss. Denies Depression. Endocrine: Denies change in weight, skin, hair change, nocturia, and paresthesia, diabetic polys, visual blurring or hyper / hypo glycemic episodes.  Heme/Lymph: No excessive bleeding, bruising or enlarged lymph nodes.  Physical Exam  BP 124/64   Pulse 96  Temp (!) 97.2 F (36.2 C)   Resp 18   Ht 5\' 5"  (1.651 m)   Wt 160 lb 9.6 oz (72.8 kg)   SpO2 (!) 89% Comment: 0n O2 at 3L  BMI 26.73 kg/m   General Appearance: Well nourished and well groomed and in no apparent distress.  Eyes: PERRLA, EOMs, conjunctiva no swelling or erythema, normal fundi and vessels. Sinuses: No frontal/maxillary tenderness ENT/Mouth: EACs patent / TMs  nl. Nares clear without erythema, swelling, mucoid exudates. Oral hygiene is good. No erythema, swelling, or exudate. Tongue normal, non-obstructing. Tonsils not swollen or erythematous. Hearing normal.  Neck: Supple, thyroid not palpable. No bruits, nodes or JVD. Respiratory: Respiratory effort normal.  BS equal and clear bilateral without rales, rhonci, wheezing or stridor. Cardio: Heart sounds are normal with regular rate and rhythm and no murmurs, rubs or gallops.  Peripheral pulses are normal and equal bilaterally without edema. No aortic or femoral bruits. Chest: symmetric with normal excursions and percussion.  Abdomen: Soft, with Nl bowel sounds. Nontender, no guarding, rebound, hernias, masses, or organomegaly.  Lymphatics: Non tender without lymphadenopathy.  Musculoskeletal: Full ROM all peripheral extremities, joint stability, 5/5 strength, and normal gait. Skin: Warm and dry without rashes, lesions, cyanosis, clubbing or  ecchymosis.  Neuro: Cranial nerves intact, reflexes equal bilaterally. Normal muscle tone, no cerebellar symptoms. Sensation intact.  Pysch: Alert and oriented X 3 with normal affect, insight and judgment appropriate.   Assessment and Plan  1. Labile hypertension  - EKG 12-Lead - [place], RETROPERITNL ABD,  LTD - Urinalysis, Routine w reflex microscopic - Microalbumin / creatinine urine ratio - CBC with Differential/Platelet - COMPLETE METABOLIC PANEL WITH GFR - Magnesium - TSH  2. Hyperlipidemia, mixed  - EKG 12-Lead - [place], RETROPERITNL ABD,  LTD - Lipid panel - TSH  3. Abnormal glucose  - EKG 12-Lead - [place], RETROPERITNL ABD,  LTD - Hemoglobin A1c - Insulin, random  4. Vitamin D deficiency  - VITAMIN D 25 Hydroxy  5. 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[place])   6. Chronic hypoxemic respiratory failure (H[facility])   7. Atherosclerosis of native coronary artery of native heart without angina pectoris  - EKG 12-Lead - Lipid panel  8. Screening for ischemic heart disease  - EKG 12-Lead  9. Screening for colorectal cancer  - POC Hemoccult Bld/Stl  10. BPH with obstruction/lower urinary tract symptoms  - PSA  11. Prostate cancer screening  - PSA  12. Aortic atherosclerosis ([facility])  - [place], RETROPERITNL ABD,  LTD  13. Former smoker  - EKG 12-Lead - [place], RETROPERITNL ABD,  LTD - CT CHEST LUNG CA SCREEN LOW DOSE W/O CM; Future  14. Screening for AAA (aortic abdominal aneurysm)  - [place], RETROPERITNL ABD,  LTD  15.  Medication management  - Urinalysis, Routine w reflex microscopic - Microalbumin / creatinine urine ratio - CBC with Differential/Platelet - COMPLETE METABOLIC PANEL WITH GFR - Magnesium - Lipid panel - TSH - Hemoglobin A1c - Insulin, random - VITAMIN D 25 Hydroxy   16. Encounter for screening for lung cancer  - CT CHEST LUNG CA SCREEN LOW DOSE W/O CM; Future        Patient was counseled in prudent diet, weight control to achieve/maintain BMI less than 25, BP monitoring, regular exercise and medications as discussed.  Discussed med effects and SE's. Routine screening labs and tests as requested with regular follow-up as recommended. Over 40 minutes of exam, counseling, chart review and high complex critical decision making was performed  Kirtland Bouchard, MD

## 2019-09-30 LAB — COMPLETE METABOLIC PANEL WITH GFR
AG Ratio: 2 (calc) (ref 1.0–2.5)
ALT: 14 U/L (ref 9–46)
AST: 15 U/L (ref 10–35)
Albumin: 4.6 g/dL (ref 3.6–5.1)
Alkaline phosphatase (APISO): 59 U/L (ref 35–144)
BUN: 23 mg/dL (ref 7–25)
CO2: 33 mmol/L — ABNORMAL HIGH (ref 20–32)
Calcium: 9.5 mg/dL (ref 8.6–10.3)
Chloride: 98 mmol/L (ref 98–110)
Creat: 0.89 mg/dL (ref 0.70–1.25)
GFR, Est African American: 103 mL/min/{1.73_m2} (ref >=60)
GFR, Est Non African American: 88 mL/min/{1.73_m2} (ref >=60)
Globulin: 2.3 g/dL (calc) (ref 1.9–3.7)
Glucose, Bld: 87 mg/dL (ref 65–99)
Potassium: 4.9 mmol/L (ref 3.5–5.3)
Sodium: 138 mmol/L (ref 135–146)
Total Bilirubin: 0.5 mg/dL (ref 0.2–1.2)
Total Protein: 6.9 g/dL (ref 6.1–8.1)

## 2019-09-30 LAB — CBC WITH DIFFERENTIAL/PLATELET
Absolute Monocytes: 904 cells/uL (ref 200–950)
Basophils Absolute: 20 cells/uL (ref 0–200)
Basophils Relative: 0.3 %
Eosinophils Absolute: 73 cells/uL (ref 15–500)
Eosinophils Relative: 1.1 %
HCT: 48.7 % (ref 38.5–50.0)
Hemoglobin: 16.7 g/dL (ref 13.2–17.1)
Lymphs Abs: 1478 cells/uL (ref 850–3900)
MCH: 32.2 pg (ref 27.0–33.0)
MCHC: 34.3 g/dL (ref 32.0–36.0)
MCV: 94 fL (ref 80.0–100.0)
MPV: 10.8 fL (ref 7.5–12.5)
Monocytes Relative: 13.7 %
Neutro Abs: 4125 cells/uL (ref 1500–7800)
Neutrophils Relative %: 62.5 %
Platelets: 182 10*3/uL (ref 140–400)
RBC: 5.18 10*6/uL (ref 4.20–5.80)
RDW: 11.5 % (ref 11.0–15.0)
Total Lymphocyte: 22.4 %
WBC: 6.6 10*3/uL (ref 3.8–10.8)

## 2019-09-30 LAB — MICROALBUMIN / CREATININE URINE RATIO
Creatinine, Urine: 66 mg/dL (ref 20–320)
Microalb, Ur: <0.2 mg/dL

## 2019-09-30 LAB — URINALYSIS, ROUTINE W REFLEX MICROSCOPIC
Bilirubin Urine: NEGATIVE
Glucose, UA: NEGATIVE
Hgb urine dipstick: NEGATIVE
Ketones, ur: NEGATIVE
Leukocytes,Ua: NEGATIVE
Nitrite: NEGATIVE
Protein, ur: NEGATIVE
Specific Gravity, Urine: 1.017 (ref 1.001–1.03)
pH: 7 (ref 5.0–8.0)

## 2019-09-30 LAB — HEMOGLOBIN A1C
Hgb A1c MFr Bld: 5.8 % of total Hgb — ABNORMAL HIGH (ref <5.7)
Mean Plasma Glucose: 120 (calc)
eAG (mmol/L): 6.6 (calc)

## 2019-09-30 LAB — MAGNESIUM: Magnesium: 2.1 mg/dL (ref 1.5–2.5)

## 2019-09-30 LAB — LIPID PANEL
Cholesterol: 166 mg/dL (ref <200)
HDL: 44 mg/dL (ref >=40)
LDL Cholesterol (Calc): 98 mg/dL (calc)
Non-HDL Cholesterol (Calc): 122 mg/dL (calc) (ref <130)
Total CHOL/HDL Ratio: 3.8 (calc) (ref <5.0)
Triglycerides: 142 mg/dL (ref <150)

## 2019-09-30 LAB — TSH: TSH: 1.82 mIU/L (ref 0.40–4.50)

## 2019-09-30 LAB — PSA: PSA: 0.7 ng/mL (ref <=4.0)

## 2019-09-30 LAB — VITAMIN D 25 HYDROXY (VIT D DEFICIENCY, FRACTURES): Vit D, 25-Hydroxy: 57 ng/mL (ref 30–100)

## 2019-09-30 LAB — INSULIN, RANDOM: Insulin: 10.5 u[IU]/mL

## 2019-10-01 ENCOUNTER — Encounter: Payer: Self-pay | Admitting: Internal Medicine

## 2019-11-01 ENCOUNTER — Ambulatory Visit
Admission: RE | Admit: 2019-11-01 | Discharge: 2019-11-01 | Disposition: A | Discharge status: Home / Self Care | Payer: Medicare Other | Source: Ambulatory Visit | Attending: Internal Medicine | Admitting: Internal Medicine

## 2019-11-01 ENCOUNTER — Other Ambulatory Visit: Payer: Self-pay

## 2019-11-01 DIAGNOSIS — Z122 Encounter for screening for malignant neoplasm of respiratory organs: Secondary | ICD-10-CM

## 2019-11-01 DIAGNOSIS — Z87891 Personal history of nicotine dependence: Secondary | ICD-10-CM

## 2019-11-01 DIAGNOSIS — F17210 Nicotine dependence, cigarettes, uncomplicated: Secondary | ICD-10-CM | POA: Diagnosis not present

## 2019-11-07 ENCOUNTER — Other Ambulatory Visit: Payer: Self-pay

## 2019-11-07 DIAGNOSIS — Z1211 Encounter for screening for malignant neoplasm of colon: Secondary | ICD-10-CM | POA: Diagnosis not present

## 2019-11-07 DIAGNOSIS — Z1212 Encounter for screening for malignant neoplasm of rectum: Secondary | ICD-10-CM | POA: Diagnosis not present

## 2019-11-07 LAB — POC HEMOCCULT BLD/STL (HOME/3-CARD/SCREEN)
Card #2 Fecal Occult Blod, POC: NEGATIVE
Card #3 Fecal Occult Blood, POC: NEGATIVE
Fecal Occult Blood, POC: NEGATIVE

## 2019-11-16 DIAGNOSIS — H2513 Age-related nuclear cataract, bilateral: Secondary | ICD-10-CM | POA: Diagnosis not present

## 2019-11-16 IMAGING — CT CT CHEST LUNG CANCER SCREENING LOW DOSE W/O CM
1 of 2 series · 10 of 40 positions shown, 13 images · non-contrast
Comparison: Low-dose lung cancer screening chest CT 08/26/2017.

CLINICAL DATA: 67-year-old male current smoker with 41 pack-year
history of smoking. Lung cancer screening examination.

EXAM:
CT CHEST WITHOUT CONTRAST LOW-DOSE FOR LUNG CANCER SCREENING
TECHNIQUE: Multidetector CT imaging of the chest was performed following the
standard protocol without IV contrast.

[ct lung segmentation data · axial · 0.77mm/px · z∈[-346,-346]mm · 10 of 335 frames shown]
[frame 1/335  mediastinal]
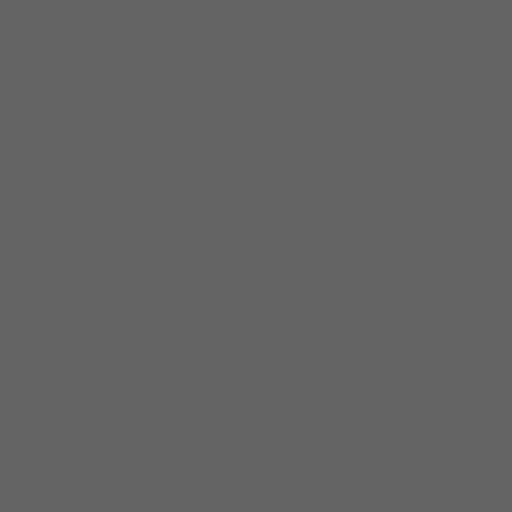
[frame 1/335  lung]
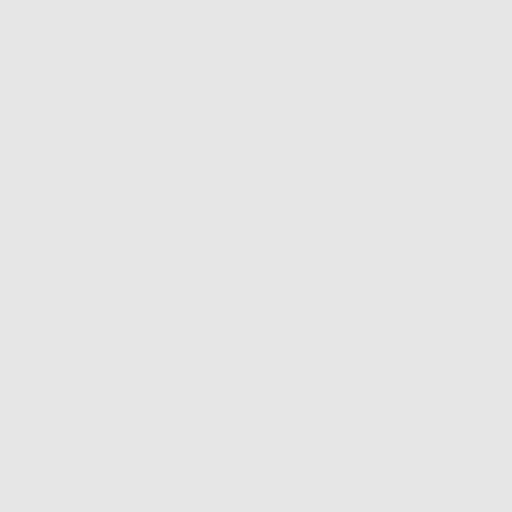
[frame 38/335  lung]
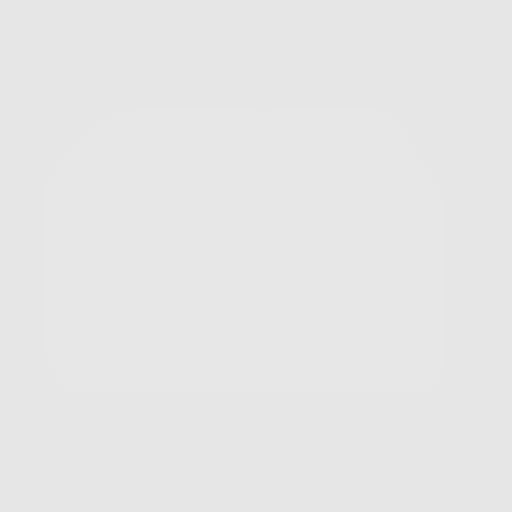
[frame 75/335  lung]
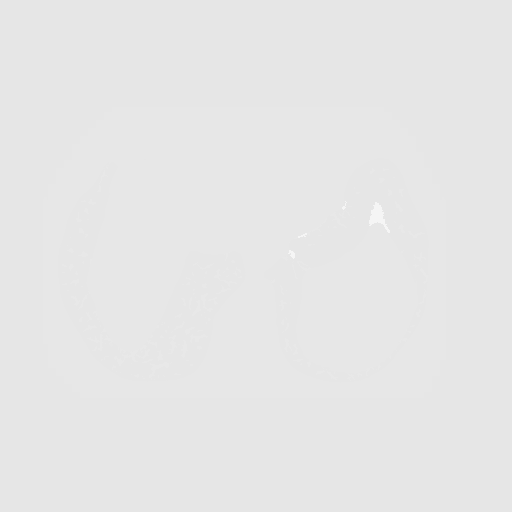
[frame 112/335  lung]
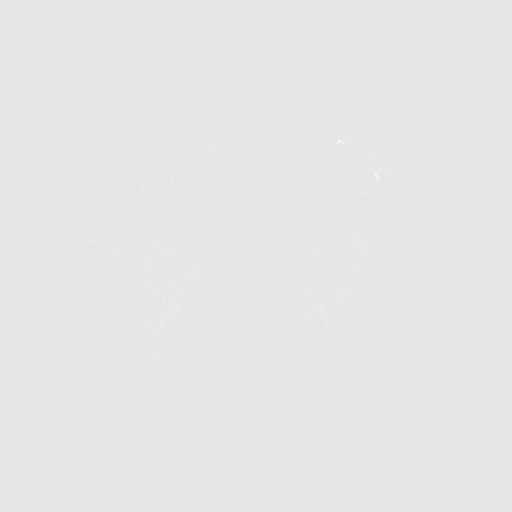
[frame 149/335  mediastinal]
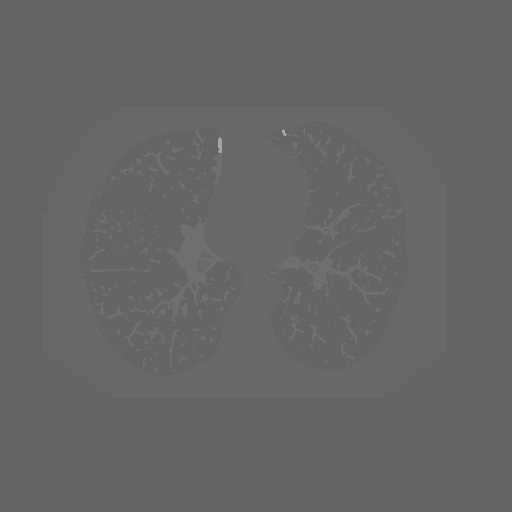
[frame 149/335  lung]
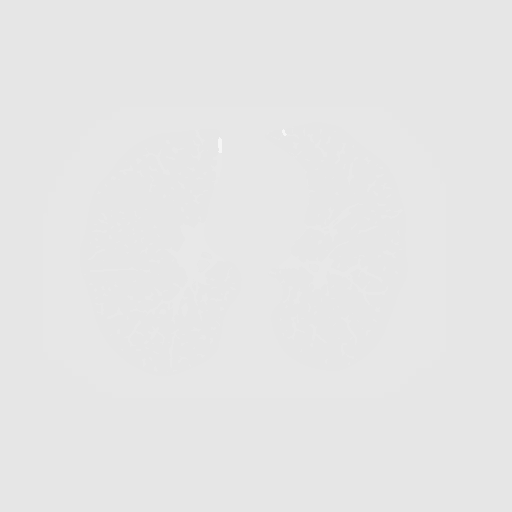
[frame 186/335  lung]
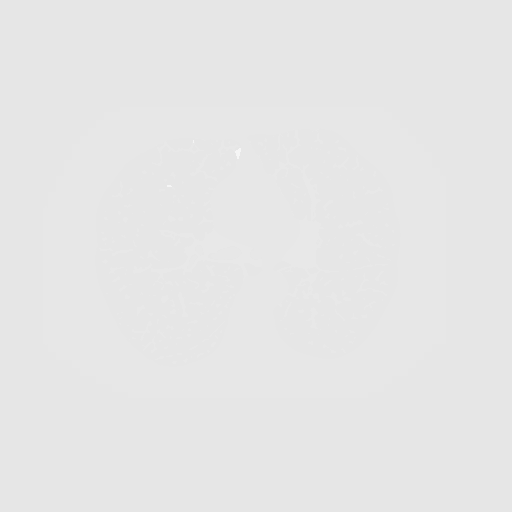
[frame 223/335  lung]
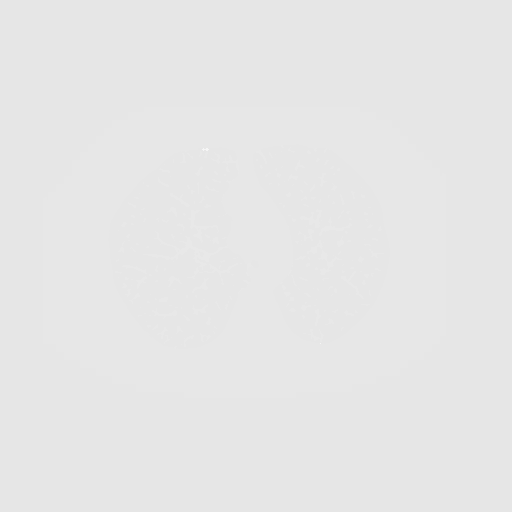
[frame 260/335  lung]
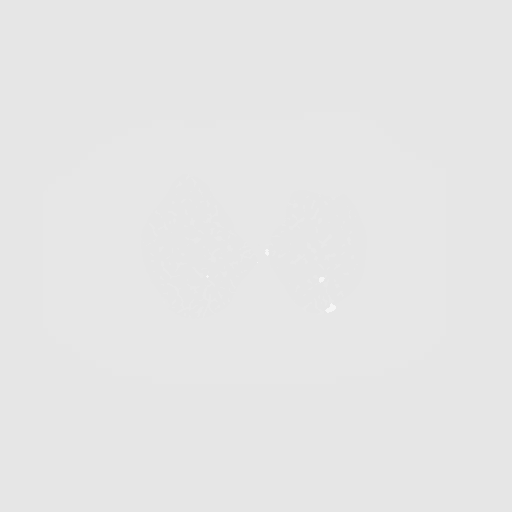
[frame 297/335  mediastinal]
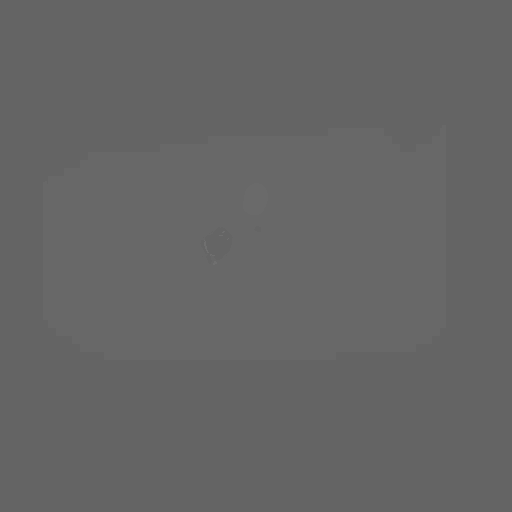
[frame 297/335  lung]
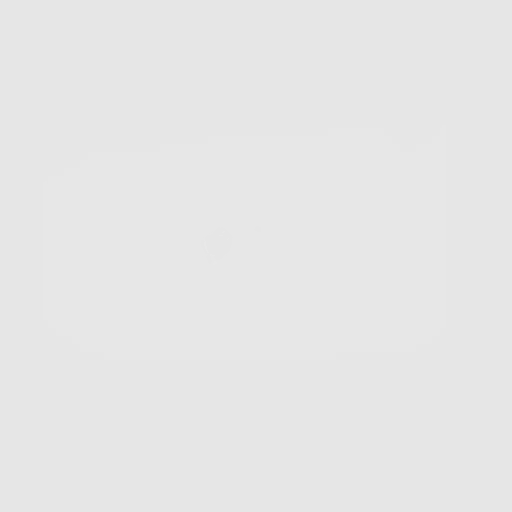
[frame 335/335  lung]
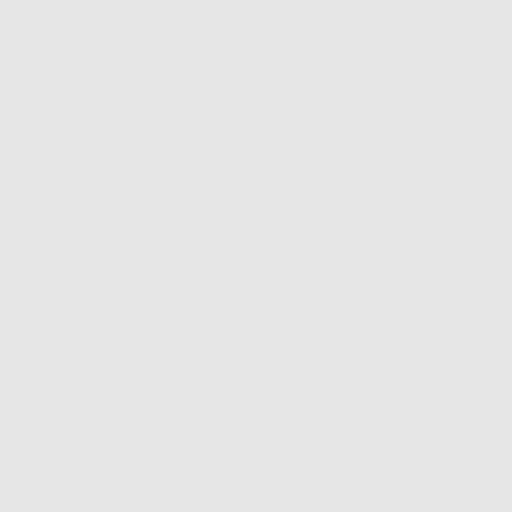

[10 of 40 positions shown; findings below may reference images not displayed]

FINDINGS: Cardiovascular: Heart size is normal. There is no significant
pericardial fluid, thickening or pericardial calcification. There is
aortic atherosclerosis, as well as atherosclerosis of the great
vessels of the mediastinum and the coronary arteries, including
calcified atherosclerotic plaque in the left main, left anterior
descending, left circumflex and right coronary arteries.

Mediastinum/Nodes: No pathologically enlarged mediastinal or hilar
lymph nodes. Please note that accurate exclusion of hilar adenopathy
is limited on noncontrast CT scans. Esophagus is unremarkable in
appearance. No axillary lymphadenopathy.

Lungs/Pleura: Multiple small pulmonary nodules are noted throughout
the lungs bilaterally, similar in size and number in retrospect to
the prior examination, considered benign. No other larger more
suspicious appearing pulmonary nodules or masses are noted. No acute
consolidative airspace disease. No pleural effusions. Diffuse
bronchial wall thickening with moderate centrilobular and paraseptal
emphysema. Areas of post infectious scarring in the inferior segment
of the lingula and medial aspects of the upper lobes of the lungs
bilaterally.

Upper Abdomen: Aortic atherosclerosis.

Musculoskeletal: There are no aggressive appearing lytic or blastic
lesions noted in the visualized portions of the skeleton.
IMPRESSION: 1. Lung-RADS 2S, benign appearance or behavior. Continue annual
screening with low-dose chest CT without contrast in 12 months.
2. The "S" modifier above refers to potentially clinically
significant non lung cancer related findings. Specifically, there is
aortic atherosclerosis, in addition to left main and 3 vessel
coronary artery disease. Please note that although the presence of
coronary artery calcium documents the presence of coronary artery
disease, the severity of this disease and any potential stenosis
cannot be assessed on this non-gated CT examination. Assessment for
potential risk factor modification, dietary therapy or pharmacologic
therapy may be warranted, if clinically indicated.
3. Mild diffuse bronchial wall thickening with moderate
centrilobular and paraseptal emphysema; imaging findings suggestive
of underlying COPD.

Aortic Atherosclerosis (QJVCB-[id number].U) and Emphysema ([id number]).

## 2019-11-17 DIAGNOSIS — Z1212 Encounter for screening for malignant neoplasm of rectum: Secondary | ICD-10-CM

## 2019-11-17 DIAGNOSIS — Z1211 Encounter for screening for malignant neoplasm of colon: Secondary | ICD-10-CM

## 2019-12-21 ENCOUNTER — Encounter: Payer: Self-pay | Admitting: Pulmonary Disease

## 2019-12-21 ENCOUNTER — Ambulatory Visit (INDEPENDENT_AMBULATORY_CARE_PROVIDER_SITE_OTHER): Payer: Medicare Other | Admitting: Pulmonary Disease

## 2019-12-21 ENCOUNTER — Other Ambulatory Visit: Payer: Self-pay

## 2019-12-21 VITALS — BP 120/68 | HR 94 | Ht 65.0 in | Wt 156.8 lb

## 2019-12-21 DIAGNOSIS — R911 Solitary pulmonary nodule: Secondary | ICD-10-CM | POA: Diagnosis not present

## 2019-12-21 DIAGNOSIS — D751 Secondary polycythemia: Secondary | ICD-10-CM | POA: Diagnosis not present

## 2019-12-21 DIAGNOSIS — J449 Chronic obstructive pulmonary disease, unspecified: Secondary | ICD-10-CM | POA: Diagnosis not present

## 2019-12-21 DIAGNOSIS — J9611 Chronic respiratory failure with hypoxia: Secondary | ICD-10-CM | POA: Diagnosis not present

## 2019-12-21 NOTE — Progress Notes (Signed)
Synopsis: Referred in September 2019 for severe COPD by [name], MD  Subjective:   PATIENT ID: Douglas Barrera GENDER: male DOB: [birth year], MRN: [id number]  Chief Complaint  Patient presents with  . Follow-up    Pt states he has been doing okay since last visit and denies any real complaints.    He was diagnosed with COPD about 3-4 years ago. He quit smoking a week ago after being placed on oxygen. He feels much better after being placed on O2. He has much more energy after start O2. He has never been hospitalized for an exacerbation. Smoked for 40 years, 1 ppd.  Patient denies weight loss, fevers, chills, night sweats.  He does have sputum production.  He is able to complete most of his activities of daily living.  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  He used to travel around to local bakery's as well as a crossed many states to teach institutions and bakeries how to bake certain tasks.  He is able to go to the grocery store, pushes on buggy as well as mow his grass while riding a lawnmower.  OV 07/07/2018: Patient been doing well since last office visit.  He is trying to obtain POC.  Insurance has required requalification for O2 needs.  Presents today for walking in the office for that.  He has been using his new nebulized medications.  He is not really sure he seen much difference but he does think that his functional status is good.  Today he plans to go home and mow the grass on riding mower.  He is able to go to the grocery store.  He still is able to work in his kitchen and bake.  Overall doing well.  OV 10/13/2018: This is a 69 year old with very severe COPD.  Overall has been doing well for the past several months.  He had a low-dose lung cancer screening CT which revealed coronary calcifications and no evidence or/concern of a underlying malignancy.  This is being done by his primary care provider."  Radiology recommended a one-year follow-up.  He does have recent evaluation by cardiology that  plans a CT a coronary evaluation.  Overall he has been doing well and has no significant complaints today.  He enjoys using his nebulizers.  He plans to pick a cherry pie this weekend for his wife/Valentine's Day.  As for his current respiratory symptoms he maintains with some dyspnea on exertion.  He does have a planned colonoscopy for next week.  OV 06/30/2019: Patient doing very well today he has very severe COPD at baseline.  He was on nebulized LAMA LABA however currently only using Perforomist and as needed duo nebs.  He is stable on 3 L nasal cannula.  Able to complete most of his activities of daily living.  States that he mowed the grass last week with no trouble.  Trying to be as active as possible.  He is planning a trip to go across the country driving.  He would like to attempt to be approved for a POC concentrator that would help his travel significantly.  Having a car adapter would make this travel much simpler.  He will also plan to travel with his oxygen tanks.  He has a SpO2 device that he checks his sats regularly.  Otherwise no significant change in his respiratory symptoms baseline.  OV 12/21/19: Here for follow up regarding COPD.  Overall no complaints today.  He is at his standard baseline of 3  L.  O2 sat 89% today in the office.  He does check his sats regularly at home and usually there stable in the high 80s to low 90s.  He is able to complete all of his activities of daily living.  He has 2 new kittens in the home.  He does have some seasonal allergies that has been taking over-the-counter antihistamines for.  He does have some questions about his oxygen needs from [facility].  They requested a recertification.  Work on a ensure that he has this completed today in the office with no issues from home health agency.   Past Medical History:  Diagnosis Date  . COPD (chronic obstructive pulmonary disease) ([place])   . GERD (gastroesophageal reflux disease)   . History of elevated  lipids   . IBS (irritable bowel syndrome)   . Labile hypertension   . Prediabetes   . Tubular adenoma of colon   . Vitamin D deficiency      Family History  Problem Relation Age of Onset  . Breast cancer Mother   . Stomach cancer Mother      Past Surgical History:  Procedure Laterality Date  . BASAL CELL CARCINOMA EXCISION  2008   Left [place]  . COLONOSCOPY WITH PROPOFOL N/A 10/26/2018   Procedure: COLONOSCOPY WITH PROPOFOL;  Surgeon: Ladene Artist, MD;  Location: [facility];  Service: Endoscopy;  Laterality: N/A;  . TESTICLE SURGERY     testicle removed    Social History   Socioeconomic History  . Marital status: Married    Spouse name: Not on file  . Number of children: 0  . Years of education: Not on file  . Highest education level: Not on file  Occupational History  . Occupation: retired  Tobacco Use  . Smoking status: Former Smoker    Packs/day: 2.00    Years: 49.00    Pack years: 98.00    Types: Cigars, Cigarettes    Start date: 10/17/1968    Quit date: 08/18/2018    Years since quitting: 1.3  . Smokeless tobacco: Never Used  . Tobacco comment: quit smoking cigarettes in Dec 2019 but do backslide occasional, but smokes 3-4 cigars a week. small sigar on occasion 10/13/18  Substance and Sexual Activity  . Alcohol use: No    Alcohol/week: 0.0 standard drinks  . Drug use: No  . Sexual activity: Not on file  Other Topics Concern  . Not on file  Social History Narrative  . Not on file   Social Determinants of Health   Financial Resource Strain:   . Difficulty of Paying Living Expenses:   Food Insecurity:   . Worried About Charity fundraiser in the Last Year:   . Arboriculturist in the Last Year:   Transportation Needs:   . Film/video editor (Medical):   Marland Kitchen Lack of Transportation (Non-Medical):   Physical Activity:   . Days of Exercise per Week:   . Minutes of Exercise per Session:   Stress:   . Feeling of Stress :   Social Connections:   .  Frequency of Communication with Friends and Family:   . Frequency of Social Gatherings with Friends and Family:   . Attends Religious Services:   . Active Member of Clubs or Organizations:   . Attends Archivist Meetings:   Marland Kitchen Marital Status:   Intimate Partner Violence:   . Fear of Current or Ex-Partner:   . Emotionally Abused:   .  Physically Abused:   . Sexually Abused:      Allergies  Allergen Reactions  . Niacin And Related Other (See Comments)    Unknown Childhood reaction.     Outpatient Medications Prior to Visit  Medication Sig Dispense Refill  . albuterol (VENTOLIN HFA) 108 (90 Base) MCG/ACT inhaler Inhale 1-2 puffs into the lungs every 4 (four) hours as needed for wheezing or shortness of breath.    Marland Kitchen aspirin 81 MG tablet Take 81 mg by mouth every evening.     Marland Kitchen b complex vitamins tablet Take 1 tablet by mouth every evening.     . Cholecalciferol (VITAMIN D3) 5000 units CAPS Take 10,000 Units by mouth daily.     . diphenhydrAMINE (BENADRYL) 25 MG tablet Take 50 mg by mouth daily.     . fluticasone (FLONASE) 50 MCG/ACT nasal spray Place 2 sprays into both nostrils daily. (Patient taking differently: Place 2 sprays into both nostrils every evening. ) 48 g 3  . formoterol (PERFOROMIST) 20 MCG/2ML nebulizer solution Use 2 mls by Nebulizer 2 x /day 360 mL 3  . guaifenesin (HUMIBID E) 400 MG TABS tablet Take 800 mg by mouth daily.     Marland Kitchen ipratropium (ATROVENT) 0.06 % nasal spray Use 1 to 2 sprays each nostril 2 to 3 x /day as needed 45 mL 3  . traZODone (DESYREL) 150 MG tablet Take 1 tablet 1 hour before Bedtime as needed for Sleep 90 tablet 3  . trolamine salicylate (ASPERCREME) 10 % cream Apply 1 application topically as needed for muscle pain.    Marland Kitchen zinc gluconate 50 MG tablet Take 50 mg by mouth daily.    Marland Kitchen albuterol (PROVENTIL HFA;VENTOLIN HFA) 108 (90 Base) MCG/ACT inhaler Inhale 1 to 2 puffs 4 x day or every 4 hours to rescue asthma (Patient taking differently:  Inhale 1-2 puffs into the lungs every 6 (six) hours as needed for wheezing or shortness of breath. ) 3 Inhaler 3   No facility-administered medications prior to visit.    Review of Systems  Constitutional: Negative for chills, fever, malaise/fatigue and weight loss.  HENT: Negative for hearing loss, sore throat and tinnitus.   Eyes: Negative for blurred vision and double vision.  Respiratory: Positive for sputum production and shortness of breath. Negative for cough, hemoptysis, wheezing and stridor.   Cardiovascular: Negative for chest pain, palpitations, orthopnea, leg swelling and PND.  Gastrointestinal: Negative for abdominal pain, constipation, diarrhea, heartburn, nausea and vomiting.  Genitourinary: Negative for dysuria, hematuria and urgency.  Musculoskeletal: Negative for joint pain and myalgias.  Skin: Negative for itching and rash.  Neurological: Negative for dizziness, tingling, weakness and headaches.  Endo/Heme/Allergies: Negative for environmental allergies. Does not bruise/bleed easily.  Psychiatric/Behavioral: Negative for depression. The patient is not nervous/anxious and does not have insomnia.   All other systems reviewed and are negative.    Objective:  Physical Exam Vitals reviewed.  Constitutional:      General: He is not in acute distress.    Appearance: He is well-developed.  HENT:     Head: Normocephalic and atraumatic.     Mouth/Throat:     Pharynx: No oropharyngeal exudate.  Eyes:     Conjunctiva/sclera: Conjunctivae normal.     Pupils: Pupils are equal, round, and reactive to light.  Neck:     Vascular: No JVD.     Trachea: No tracheal deviation.     Comments: Loss of supraclavicular fat Cardiovascular:     Rate and Rhythm: Normal  rate and regular rhythm.     Heart sounds: S1 normal and S2 normal.     Comments: Distant heart tones Pulmonary:     Effort: No tachypnea or accessory muscle usage.     Breath sounds: No stridor. Decreased breath  sounds (throughout all lung fields) present. No wheezing, rhonchi or rales.  Abdominal:     General: Bowel sounds are normal. There is no distension.     Palpations: Abdomen is soft.     Tenderness: There is no abdominal tenderness.  Musculoskeletal:        General: Deformity (muscle wasting ) present.  Skin:    General: Skin is warm and dry.     Capillary Refill: Capillary refill takes less than 2 seconds.     Findings: No rash.  Neurological:     Mental Status: He is alert and oriented to person, place, and time.  Psychiatric:        Behavior: Behavior normal.      Vitals:   12/21/19 0904  BP: 120/68  Pulse: 94  SpO2: (!) 89%  Weight: 156 lb 12.8 oz (71.1 kg)  Height: 5\' 5"  (1.651 m)   (!) 89% on 3 L nasal cannula BMI Readings from Last 3 Encounters:  12/21/19 26.09 kg/m  09/29/19 26.73 kg/m  06/30/19 26.70 kg/m   Wt Readings from Last 3 Encounters:  12/21/19 156 lb 12.8 oz (71.1 kg)  09/29/19 160 lb 9.6 oz (72.8 kg)  06/30/19 158 lb (71.7 kg)     CBC    Component Value Date/Time   WBC 6.6 09/29/2019 1017   RBC 5.18 09/29/2019 1017   HGB 16.7 09/29/2019 1017   HGB 16.8 08/19/2018 1308   HCT 48.7 09/29/2019 1017   PLT 182 09/29/2019 1017   PLT 194 08/19/2018 1308   MCV 94.0 09/29/2019 1017   MCH 32.2 09/29/2019 1017   MCHC 34.3 09/29/2019 1017   RDW 11.5 09/29/2019 1017   LYMPHSABS 1,478 09/29/2019 1017   MONOABS 1.3 (H) 08/19/2018 1308   EOSABS 73 09/29/2019 1017   BASOSABS 20 09/29/2019 1017    Chest Imaging: 08/26/2017 CT chest lung cancer screening, LD CT Lung-RADS 1 radiology, recommending annual continued screening. Evidence of mild bronchial thickening, scattered areas of groundglass, areas of scarring. The patient's images have been independently reviewed by me.   09/15/2018: LDCT -  LUNG RADS 2S, with coronary calcifications  The patient's images have been independently reviewed by me.    11/01/2019 LDCT: LUNG RAD 2, continuing annual  follow up, small subcentimeter nodule 17mm  The patient's images have been independently reviewed by me.       Pulmonary Functions Testing Results: PFT Results Latest Ref Rng & Units 05/31/2018 03/24/2018  FVC-Pre L 1.83 1.72  FVC-Predicted Pre % 48 45  FVC-Post L 1.89 -  FVC-Predicted Post % 50 -  Pre FEV1/FVC % % 46 36  Post FEV1/FCV % % 34 -  FEV1-Pre L 0.84 0.61  FEV1-Predicted Pre % 30 22  FEV1-Post L 0.65 -  DLCO UNC% % 34 27  DLCO COR %Predicted % 52 49  TLC L - 5.85  TLC % Predicted % - 97  RV % Predicted % - 198    FeNO: None   Pathology: None   Echocardiogram: None   Heart Catheterization: None     Assessment & Plan:   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HCC)  Chronic hypoxemic respiratory failure (HCC)  Secondary polycythemia  Left upper lobe pulmonary nodule  Discussion:  69 year old gentleman with very severe COPD FEV1 22% predicted, chronic hypoxemic respiratory failure on nasal cannula O2 supplementation, currently 3 L pulse.  Patient benefits from oxygen therapy which is needed to maintain his O2 sats above 88%. Overall functional status with an mMRC of 1.  Patient remains smoke-free.  Doing well on nebulized LAMA plus LABA. Continue oxygen supplementation therapy.  Patient had recent lung cancer screening CT reviewed as stated above.  Plans for 36-month follow-up lung cancer screening CT. This is being ordered and followed by his primary care doctor's office.  He has appointment with him next week.  I did discuss his LDCT reports with him today.  He will also need to be walked today for O2 saturation to help requalify for his oxygen therapy through [facility].  Greater than 50% of this patient's 32-minute of visit was been face-to-face discussing above recommendations and treatment plan.    Current Outpatient Medications:  .  albuterol (VENTOLIN HFA) 108 (90 Base) MCG/ACT inhaler, Inhale 1-2 puffs into the lungs every 4 (four) hours as needed for wheezing or  shortness of breath., Disp: , Rfl:  .  aspirin 81 MG tablet, Take 81 mg by mouth every evening. , Disp: , Rfl:  .  b complex vitamins tablet, Take 1 tablet by mouth every evening. , Disp: , Rfl:  .  Cholecalciferol (VITAMIN D3) 5000 units CAPS, Take 10,000 Units by mouth daily. , Disp: , Rfl:  .  diphenhydrAMINE (BENADRYL) 25 MG tablet, Take 50 mg by mouth daily. , Disp: , Rfl:  .  fluticasone (FLONASE) 50 MCG/ACT nasal spray, Place 2 sprays into both nostrils daily. (Patient taking differently: Place 2 sprays into both nostrils every evening. ), Disp: 48 g, Rfl: 3 .  formoterol (PERFOROMIST) 20 MCG/2ML nebulizer solution, Use 2 mls by Nebulizer 2 x /day, Disp: 360 mL, Rfl: 3 .  guaifenesin (HUMIBID E) 400 MG TABS tablet, Take 800 mg by mouth daily. , Disp: , Rfl:  .  ipratropium (ATROVENT) 0.06 % nasal spray, Use 1 to 2 sprays each nostril 2 to 3 x /day as needed, Disp: 45 mL, Rfl: 3 .  traZODone (DESYREL) 150 MG tablet, Take 1 tablet 1 hour before Bedtime as needed for Sleep, Disp: 90 tablet, Rfl: 3 .  trolamine salicylate (ASPERCREME) 10 % cream, Apply 1 application topically as needed for muscle pain., Disp: , Rfl:  .  zinc gluconate 50 MG tablet, Take 50 mg by mouth daily., Disp: , Rfl:  .  albuterol (PROVENTIL HFA;VENTOLIN HFA) 108 (90 Base) MCG/ACT inhaler, Inhale 1 to 2 puffs 4 x day or every 4 hours to rescue asthma (Patient taking differently: Inhale 1-2 puffs into the lungs every 6 (six) hours as needed for wheezing or shortness of breath. ), Disp: 3 Inhaler, Rfl: 3   Garner Nash, DO [facility] 12/21/2019 9:08 AM

## 2019-12-21 NOTE — Patient Instructions (Addendum)
Thank you for visiting Dr. Valeta Harms at [facility]. Today we recommend the following:  Call me with any changes in respiratory symptoms.   Return in about 6 months (around 06/21/2020) for w/ Dr. Valeta Harms .    Please do your part to reduce the spread of COVID-19.  '

## 2019-12-21 NOTE — Addendum Note (Signed)
Addended by: Lorretta Harp on: 12/21/2019 09:59 AM   Modules accepted: Orders

## 2019-12-26 NOTE — Progress Notes (Signed)
MEDICARE ANNUAL WELLNESS VISIT AND FOLLOW UP Assessment:   [name] was seen today for follow-up and medicare wellness.  Diagnoses and all orders for this visit:  Encounter for Annual Medicare preventive visit  Labile hypertension Controlled off of medications Monitor blood pressure at home; call if consistently over 130/80 Continue DASH diet.   Reminder to go to the ER if any CP, SOB, nausea, dizziness, severe HA, changes vision/speech, left arm numbness and tingling and jaw pain.  Coronary atherosclerosis due to calcified coronary lesion Per CT, pending coronary calcium score by Dr. Gwenlyn Found  Control blood pressure, cholesterol, glucose, increase exercise.   Aortic atherosclerosis ([place]) Per numerous CTs Control blood pressure, cholesterol, glucose, increase exercise.   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; continue neb/inhalers; O2; counseled to stop smoking  Chronic hypoxemic respiratory failure ([place]) On O2; monitor   Irritable bowel syndrome, unspecified type Add soluble fiber; monitor; recently fairly managed  Gastroesophageal reflux disease, esophagitis presence not specified Well managed on current medications Discussed diet, avoiding triggers and other lifestyle changes  Vitamin D deficiency At goal at recent check; continue to recommend supplementation for goal of 60-100 Defer vitamin D level  Smoker Rare, encouraged cessation; neg CT screening 2021  Other abnormal glucose (prediabetes) Discussed disease and risks Discussed diet/exercise, weight management  A1C  Medication management  Hyperlipidemia Pending coronary calcium study by Dr. Gwenlyn Found to determine LDL goal; patient declines until pandemic improves, declines cholesterol medication at this time.  Continue low cholesterol diet and exercise.  Check lipid panel.   History of adenomatous polyp of colon UTD on colonoscopy; due 2025  Overweight (BMI 25.0-29.9) Continue to recommend diet  heavy in fruits and veggies and low in animal meats, cheeses, and dairy products, appropriate calorie intake Discuss exercise recommendations routinely Continue to monitor weight at each visit  Need for hepatitis C screening, born prior to 1964 - hepatitis C antibody   Over 30 minutes of exam, counseling, chart review, and critical decision making was performed  Future Appointments  Date Time Provider [place]  04/03/2020  9:30 AM [name], MD [facility] None  10/15/2020 11:00 AM [name], MD [facility] None     Plan:   During the course of the visit the patient was educated and counseled about appropriate screening and preventive services including:    Pneumococcal vaccine   Influenza vaccine  Prevnar 13  Td vaccine  Screening electrocardiogram  Colorectal cancer screening  Diabetes screening  Glaucoma screening  Nutrition counseling    Subjective:  Douglas Barrera is a 69 y.o. male who presents for Medicare Annual Wellness Visit and 3 month follow up for HTN, hyperlipidemia, prediabetes, and vitamin D Def.   Busy week, brother in law passed away in nursing home. He is busy coordinating but doing well.   Patient has severe COPD consequent of years of smoking, controlled with MDI  & nebulized meds (perforomist) and guaifenesin.  He is followed by Dr Carney Corners (Pulmonary), on continuous Oxygen therapy.  He moitors O2 sats and regulate his O2 flow rate between 2 - 3 l/min to keep his O2 sats in the low 90's range. He does admit still occasionally "sneaking " a cigar (outside away from his O2).   Ct lung in 10/2019 showed no concerning nodules, did show emphysematous changes and aortic atherosclerosis, 3 vessel CAD. He was evaluated by Dr. Gwenlyn Found in 2020 for this who had planned coronary calcium study but was having problems with insurance, then deferred due  to pandemic.   Trazodone 150 mg daily at night for sleep and endorses restorative sleep.    BMI is Body mass index is 25.79 kg/m., he has been working on diet, pushes fresh vegetables and fruits, avoids processed foods, cooks at home most of the time.  Walks 15-20, 3-4 days a week.  Wt Readings from Last 3 Encounters:  12/29/19 155 lb (70.3 kg)  12/21/19 156 lb 12.8 oz (71.1 kg)  09/29/19 160 lb 9.6 oz (72.8 kg)   His blood pressure has been controlled at home, today their BP is BP: 118/64  He does workout. He denies chest pain, shortness of breath (none if he stays on O2) dizziness.   He is not on cholesterol medication and denies myalgias. His cholesterol is at goal. The cholesterol last visit was:   Lab Results  Component Value Date   CHOL 166 09/29/2019   HDL 44 09/29/2019   LDLCALC 98 09/29/2019   TRIG 142 09/29/2019   CHOLHDL 3.8 09/29/2019   He has not been working on diet and exercise for prediabetes, and denies increased appetite, nausea, paresthesia of the feet, polydipsia, polyuria and visual disturbances. Last A1C in the office was:  Lab Results  Component Value Date   HGBA1C 5.8 (H) 09/29/2019   Last GFR Lab Results  Component Value Date   GFRNONAA 88 09/29/2019    Patient is on Vitamin D supplement and near goal at recent check:    Lab Results  Component Value Date   VD25OH 57 09/29/2019      Medication Review:    Current Outpatient Medications (Respiratory):  .  albuterol (PROVENTIL HFA;VENTOLIN HFA) 108 (90 Base) MCG/ACT inhaler, Inhale 1 to 2 puffs 4 x day or every 4 hours to rescue asthma (Patient taking differently: Inhale 1-2 puffs into the lungs every 6 (six) hours as needed for wheezing or shortness of breath. ) .  albuterol (VENTOLIN HFA) 108 (90 Base) MCG/ACT inhaler, Inhale 1-2 puffs into the lungs every 4 (four) hours as needed for wheezing or shortness of breath. .  diphenhydrAMINE (BENADRYL) 25 MG tablet, Take 50 mg by mouth daily.  .  fluticasone (FLONASE) 50 MCG/ACT nasal spray, Place 2 sprays into both nostrils daily. (Patient  taking differently: Place 2 sprays into both nostrils every evening. ) .  formoterol (PERFOROMIST) 20 MCG/2ML nebulizer solution, Use 2 mls by Nebulizer 2 x /day .  guaifenesin (HUMIBID E) 400 MG TABS tablet, Take 800 mg by mouth daily.  Marland Kitchen  ipratropium (ATROVENT) 0.06 % nasal spray, Use 1 to 2 sprays each nostril 2 to 3 x /day as needed  Current Outpatient Medications (Analgesics):  .  aspirin 81 MG tablet, Take 81 mg by mouth every evening.    Current Outpatient Medications (Other):  .  b complex vitamins tablet, Take 1 tablet by mouth every evening.  .  Cholecalciferol (VITAMIN D3) 5000 units CAPS, Take 10,000 Units by mouth daily.  .  traZODone (DESYREL) 150 MG tablet, Take 1 tablet 1 hour before Bedtime as needed for Sleep .  trolamine salicylate (ASPERCREME) 10 % cream, Apply 1 application topically as needed for muscle pain. Marland Kitchen  zinc gluconate 50 MG tablet, Take 50 mg by mouth daily.  Allergies: Allergies  Allergen Reactions  . Niacin And Related Other (See Comments)    Unknown Childhood reaction.    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  Screening Tests Immunization History  Administered Date(s) Administered  . Influenza Split 06/20/2013, 06/20/2014, 06/28/2015  . Influenza, High Dose Seasonal PF 06/17/2017, 05/24/2018, 06/14/2018, 06/29/2019  . Influenza,inj,quad, With Preservative 07/21/2016  . Influenza-Unspecified 06/17/2017  . PFIZER SARS-COV-2 Vaccination 09/26/2019, 10/17/2019  . PPD Test 06/20/2014, 06/28/2015,  07/21/2016  . Pneumococcal Conjugate-13 06/20/2014  . Pneumococcal Polysaccharide-23 04/30/2009, 08/12/2017  . Tdap 09/01/2009    Preventative care: Last colonoscopy: 10/2018, due 2025  Smoker - CT lung: 10/2019  Prior vaccinations: TD or Tdap: 2011, will defer due to cost   Influenza: 06/2019  Pneumococcal: 2018 Prevnar13: 2015 Shingles/Zostavax: ? Had in 2017, unsure Covid 19: 2/2, 2021  Names of Other Physician/Practitioners you currently use: 1. [facility] here for primary care 2. Dr. Quay Burow, eye doctor, last visit 2021 3.  dentist, last visit remotely, has full denture  Patient Care Team: [name], MD as PCP - General (Internal Medicine) Ladene Artist, MD as Consulting Physician (Gastroenterology) Garner Nash, DO as Consulting Physician (Pulmonary Disease)  Surgical: He  has a past surgical history that includes Testicle surgery; Excision basal cell carcinoma (2008); and Colonoscopy with propofol (N/A, 10/26/2018). Family His family history includes Breast cancer in his mother; Stomach cancer in his mother. Social history  He reports that he quit smoking about 16 months ago. His smoking use included cigars and cigarettes. He started smoking about 51 years ago. He has a 98.00 pack-year smoking history. He has never used smokeless tobacco. He reports that he does not drink alcohol or use drugs.  MEDICARE WELLNESS OBJECTIVES: Physical activity: Current Exercise Habits: Home exercise routine, Type of exercise: walking, Time (Minutes): 20, Frequency (Times/Week): 4, Weekly Exercise (Minutes/Week): 80, Intensity: Mild, Exercise limited by: respiratory conditions(s) Cardiac risk factors: Cardiac Risk Factors include: advanced age (>40men, >34 women);male gender;dyslipidemia;hypertension Depression/mood screen:   Depression screen [facility] 2/9 10/01/2019  Decreased Interest 0  Down, Depressed, Hopeless 0  PHQ - 2 Score 0    ADLs:   In your present state of health, do you have any difficulty performing the following activities: 12/29/2019 10/01/2019  Hearing? N N  Vision? N N  Difficulty concentrating or making decisions? N N  Walking or climbing stairs? N N  Dressing or bathing? N N  Doing errands, shopping? N N  Some recent data might be hidden     Cognitive Testing  Alert? Yes  Normal Appearance?Yes  Oriented to person? Yes  Place? Yes   Time? Yes  Recall of three objects?  Yes  Can perform simple calculations? Yes  Displays appropriate judgment?Yes  Can read the correct time from a watch face?Yes  EOL planning: Does Patient Have a Medical Advance Directive?: No Does patient want to make changes to medical advance directive?: No - Patient declined Would patient like information on creating a medical advance directive?: No - Patient declined   Objective:   Today's Vitals   12/29/19 0850  BP: 118/64  Pulse: 95  Temp: (!) 97.5 F (36.4 C)  SpO2: 90%  Weight: 155 lb (70.3 kg)  Height: 5\' 5"  (1.651 m)   Body mass index is 25.79 kg/m.  General Appearance:  in no apparent distress. Eyes: PERRLA, EOMs, conjunctiva no swelling or erythema Sinuses: No Frontal/maxillary tenderness ENT/Mouth: Ext aud  canals clear, TMs without erythema, bulging. No erythema, swelling, or exudate on post pharynx. Hearing normal.  Neck: Supple, thyroid normal.  Respiratory: Respiratory effort normal, Decreased breath sounds throughout without rales, rhonchi, wheezing or stridor. Patient on [place] with 3 L. Cardio: RRR with no MRGs, distant heart sounds. Brisk peripheral pulses with mild edema bilaterally Abdomen: Soft, + BS.  Non tender, no guarding, rebound, hernias, masses. Lymphatics: Non tender without lymphadenopathy.  Musculoskeletal: Full ROM, 4/5 strength diffuse decreased muscle tone, Normal gait Skin: Warm, dry without rashes, lesions, ecchymosis.  Neuro: Cranial nerves intact. No cerebellar symptoms.  Psych: Awake  and oriented X 3, normal affect, Insight and Judgment appropriate.    Medicare Attestation I have personally reviewed: The patient's medical and social history Their use of alcohol, tobacco or illicit drugs Their current medications and supplements The patient's functional ability including ADLs,fall risks, home safety risks, cognitive, and hearing and visual impairment Diet and physical activities Evidence for depression or mood disorders  The patient's weight, height, BMI, and visual acuity have been recorded in the chart.  I have made referrals, counseling, and provided education to the patient based on review of the above and I have provided the patient with a written personalized care plan for preventive services.     Izora Ribas, NP   12/29/2019

## 2019-12-29 ENCOUNTER — Other Ambulatory Visit: Payer: Self-pay

## 2019-12-29 ENCOUNTER — Encounter: Payer: Self-pay | Admitting: Adult Health

## 2019-12-29 ENCOUNTER — Ambulatory Visit (INDEPENDENT_AMBULATORY_CARE_PROVIDER_SITE_OTHER): Payer: Medicare Other | Admitting: Adult Health

## 2019-12-29 VITALS — BP 118/64 | HR 95 | Temp 97.5°F | Ht 65.0 in | Wt 155.0 lb

## 2019-12-29 DIAGNOSIS — E663 Overweight: Secondary | ICD-10-CM | POA: Diagnosis not present

## 2019-12-29 DIAGNOSIS — K219 Gastro-esophageal reflux disease without esophagitis: Secondary | ICD-10-CM

## 2019-12-29 DIAGNOSIS — J449 Chronic obstructive pulmonary disease, unspecified: Secondary | ICD-10-CM | POA: Diagnosis not present

## 2019-12-29 DIAGNOSIS — R0989 Other specified symptoms and signs involving the circulatory and respiratory systems: Secondary | ICD-10-CM | POA: Diagnosis not present

## 2019-12-29 DIAGNOSIS — Z860101 Personal history of adenomatous and serrated colon polyps: Secondary | ICD-10-CM

## 2019-12-29 DIAGNOSIS — I700 Atherosclerosis of aorta: Secondary | ICD-10-CM | POA: Diagnosis not present

## 2019-12-29 DIAGNOSIS — E782 Mixed hyperlipidemia: Secondary | ICD-10-CM | POA: Diagnosis not present

## 2019-12-29 DIAGNOSIS — E559 Vitamin D deficiency, unspecified: Secondary | ICD-10-CM

## 2019-12-29 DIAGNOSIS — J9611 Chronic respiratory failure with hypoxia: Secondary | ICD-10-CM

## 2019-12-29 DIAGNOSIS — R6889 Other general symptoms and signs: Secondary | ICD-10-CM

## 2019-12-29 DIAGNOSIS — Z1159 Encounter for screening for other viral diseases: Secondary | ICD-10-CM

## 2019-12-29 DIAGNOSIS — F17200 Nicotine dependence, unspecified, uncomplicated: Secondary | ICD-10-CM | POA: Diagnosis not present

## 2019-12-29 DIAGNOSIS — K589 Irritable bowel syndrome without diarrhea: Secondary | ICD-10-CM

## 2019-12-29 DIAGNOSIS — Z0001 Encounter for general adult medical examination with abnormal findings: Secondary | ICD-10-CM

## 2019-12-29 DIAGNOSIS — I2510 Atherosclerotic heart disease of native coronary artery without angina pectoris: Secondary | ICD-10-CM | POA: Diagnosis not present

## 2019-12-29 DIAGNOSIS — R7309 Other abnormal glucose: Secondary | ICD-10-CM | POA: Diagnosis not present

## 2019-12-29 DIAGNOSIS — Z86010 Personal history of colonic polyps: Secondary | ICD-10-CM

## 2019-12-29 DIAGNOSIS — Z79899 Other long term (current) drug therapy: Secondary | ICD-10-CM | POA: Diagnosis not present

## 2019-12-29 DIAGNOSIS — Z0000 Encounter for general adult medical examination without abnormal findings: Secondary | ICD-10-CM

## 2019-12-29 MED ORDER — ALBUTEROL SULFATE HFA 108 (90 BASE) MCG/ACT IN AERS: INHALATION_SPRAY | RESPIRATORY_TRACT | 1 refills | Status: DC

## 2019-12-29 NOTE — Patient Instructions (Addendum)
[name] , Thank you for taking time to come for your Medicare Wellness Visit. I appreciate your ongoing commitment to your health goals. Please review the following plan we discussed and let me know if I can assist you in the future.   These are the goals we discussed: Goals    . Quit smoking / using tobacco       This is a list of the screening recommended for you and due dates:  Health Maintenance  Topic Date Due  .  Hepatitis C: One time screening is recommended by Center for Disease Control  (CDC) for  adults born from [birth year] through 1964.   Never done  . Tetanus Vaccine  12/28/2020*  . Flu Shot  04/01/2020  . Colon Cancer Screening  10/27/2023  . COVID-19 Vaccine  Completed  . Pneumonia vaccines  Completed  *Topic was postponed. The date shown is not the original due date.    Suggest being careful with red meat intake - current guidelines recommend <6oz/week, less is better. Associated with increased risk of many cancers and cardiovascular disease  Try to increase whole grains, beans, high fiber foods in diet instead -    High-Fiber Diet Fiber, also called dietary fiber, is a type of carbohydrate that is found in fruits, vegetables, whole grains, and beans. A high-fiber diet can have many health benefits. Your health care provider may recommend a high-fiber diet to help:  Prevent constipation. Fiber can make your bowel movements more regular.  Lower your cholesterol.  Relieve the following conditions: ? Swelling of veins in the anus (hemorrhoids). ? Swelling and irritation (inflammation) of specific areas of the digestive tract (uncomplicated diverticulosis). ? A problem of the large intestine (colon) that sometimes causes pain and diarrhea (irritable bowel syndrome, IBS).  Prevent overeating as part of a weight-loss plan.  Prevent heart disease, type 2 diabetes, and certain cancers. What is my plan? The recommended daily fiber intake in grams (g) includes:  38 g for  men age 75 or younger.  30 g for men over age 32.  40 g for women age 40 or younger.  21 g for women over age 29. You can get the recommended daily intake of dietary fiber by:  Eating a variety of fruits, vegetables, grains, and beans.  Taking a fiber supplement, if it is not possible to get enough fiber through your diet. What do I need to know about a high-fiber diet?  It is better to get fiber through food sources rather than from fiber supplements. There is not a lot of research about how effective supplements are.  Always check the fiber content on the nutrition facts label of any prepackaged food. Look for foods that contain 5 g of fiber or more per serving.  Talk with a diet and nutrition specialist (dietitian) if you have questions about specific foods that are recommended or not recommended for your medical condition, especially if those foods are not listed below.  Gradually increase how much fiber you consume. If you increase your intake of dietary fiber too quickly, you may have bloating, cramping, or gas.  Drink plenty of water. Water helps you to digest fiber. What are tips for following this plan?  Eat a wide variety of high-fiber foods.  Make sure that half of the grains that you eat each day are whole grains.  Eat breads and cereals that are made with whole-grain flour instead of refined flour or white flour.  Eat brown rice, bulgur  wheat, or millet instead of white rice.  Start the day with a breakfast that is high in fiber, such as a cereal that contains 5 g of fiber or more per serving.  Use beans in place of meat in soups, salads, and pasta dishes.  Eat high-fiber snacks, such as berries, raw vegetables, nuts, and popcorn.  Choose whole fruits and vegetables instead of processed forms like juice or sauce. What foods can I eat?  Fruits Berries. Pears. Apples. Oranges. Avocado. Prunes and raisins. Dried figs. Vegetables Sweet potatoes. Spinach. Kale.  Artichokes. Cabbage. Broccoli. Cauliflower. Green peas. Carrots. Squash. Grains Whole-grain breads. Multigrain cereal. Oats and oatmeal. Brown rice. Barley. Bulgur wheat. [place]. Quinoa. Bran muffins. Popcorn. Rye wafer crackers. Meats and other proteins Navy, kidney, and pinto beans. Soybeans. Split peas. Lentils. Nuts and seeds. Dairy Fiber-fortified yogurt. Beverages Fiber-fortified soy milk. Fiber-fortified orange juice. Other foods Fiber bars. The items listed above may not be a complete list of recommended foods and beverages. Contact a dietitian for more options. What foods are not recommended? Fruits Fruit juice. Cooked, strained fruit. Vegetables Fried potatoes. Canned vegetables. Well-cooked vegetables. Grains White bread. Pasta made with refined flour. White rice. Meats and other proteins Fatty cuts of meat. Fried chicken or fried fish. Dairy Milk. Yogurt. Cream cheese. Sour cream. Fats and oils Butters. Beverages Soft drinks. Other foods Cakes and pastries. The items listed above may not be a complete list of foods and beverages to avoid. Contact a dietitian for more information. Summary  Fiber is a type of carbohydrate. It is found in fruits, vegetables, whole grains, and beans.  There are many health benefits of eating a high-fiber diet, such as preventing constipation, lowering blood cholesterol, helping with weight loss, and reducing your risk of heart disease, diabetes, and certain cancers.  Gradually increase your intake of fiber. Increasing too fast can result in cramping, bloating, and gas. Drink plenty of water while you increase your fiber.  The best sources of fiber include whole fruits and vegetables, whole grains, nuts, seeds, and beans. This information is not intended to replace advice given to you by your health care provider. Make sure you discuss any questions you have with your health care provider. Document Revised: 06/22/2017 Document Reviewed:  06/22/2017 Elsevier Patient Education  2020 [employer].

## 2019-12-30 LAB — CBC WITH DIFFERENTIAL/PLATELET
Absolute Monocytes: 721 cells/uL (ref 200–950)
Basophils Absolute: 28 cells/uL (ref 0–200)
Basophils Relative: 0.4 %
Eosinophils Absolute: 63 cells/uL (ref 15–500)
Eosinophils Relative: 0.9 %
HCT: 49.9 % (ref 38.5–50.0)
Hemoglobin: 16.8 g/dL (ref 13.2–17.1)
Lymphs Abs: 1463 cells/uL (ref 850–3900)
MCH: 31.2 pg (ref 27.0–33.0)
MCHC: 33.7 g/dL (ref 32.0–36.0)
MCV: 92.8 fL (ref 80.0–100.0)
MPV: 10.5 fL (ref 7.5–12.5)
Monocytes Relative: 10.3 %
Neutro Abs: 4725 cells/uL (ref 1500–7800)
Neutrophils Relative %: 67.5 %
Platelets: 191 10*3/uL (ref 140–400)
RBC: 5.38 10*6/uL (ref 4.20–5.80)
RDW: 11.6 % (ref 11.0–15.0)
Total Lymphocyte: 20.9 %
WBC: 7 10*3/uL (ref 3.8–10.8)

## 2019-12-30 LAB — COMPLETE METABOLIC PANEL WITH GFR
AG Ratio: 1.8 (calc) (ref 1.0–2.5)
ALT: 16 U/L (ref 9–46)
AST: 16 U/L (ref 10–35)
Albumin: 4.4 g/dL (ref 3.6–5.1)
Alkaline phosphatase (APISO): 62 U/L (ref 35–144)
BUN: 24 mg/dL (ref 7–25)
CO2: 32 mmol/L (ref 20–32)
Calcium: 9.7 mg/dL (ref 8.6–10.3)
Chloride: 98 mmol/L (ref 98–110)
Creat: 0.93 mg/dL (ref 0.70–1.25)
GFR, Est African American: 97 mL/min/{1.73_m2} (ref >=60)
GFR, Est Non African American: 84 mL/min/{1.73_m2} (ref >=60)
Globulin: 2.4 g/dL (calc) (ref 1.9–3.7)
Glucose, Bld: 98 mg/dL (ref 65–99)
Potassium: 4.7 mmol/L (ref 3.5–5.3)
Sodium: 138 mmol/L (ref 135–146)
Total Bilirubin: 0.5 mg/dL (ref 0.2–1.2)
Total Protein: 6.8 g/dL (ref 6.1–8.1)

## 2019-12-30 LAB — LIPID PANEL
Cholesterol: 176 mg/dL (ref <200)
HDL: 43 mg/dL (ref >=40)
LDL Cholesterol (Calc): 107 mg/dL (calc) — ABNORMAL HIGH
Non-HDL Cholesterol (Calc): 133 mg/dL (calc) — ABNORMAL HIGH (ref <130)
Total CHOL/HDL Ratio: 4.1 (calc) (ref <5.0)
Triglycerides: 150 mg/dL — ABNORMAL HIGH (ref <150)

## 2019-12-30 LAB — HEPATITIS C ANTIBODY
Hepatitis C Ab: NONREACTIVE
SIGNAL TO CUT-OFF: 0.01 (ref <1.00)

## 2019-12-30 LAB — MAGNESIUM: Magnesium: 2.1 mg/dL (ref 1.5–2.5)

## 2019-12-30 LAB — TSH: TSH: 1.17 mIU/L (ref 0.40–4.50)

## 2020-02-15 ENCOUNTER — Other Ambulatory Visit: Payer: Self-pay | Admitting: Internal Medicine

## 2020-02-15 DIAGNOSIS — J449 Chronic obstructive pulmonary disease, unspecified: Secondary | ICD-10-CM

## 2020-02-15 DIAGNOSIS — J9611 Chronic respiratory failure with hypoxia: Secondary | ICD-10-CM

## 2020-02-27 ENCOUNTER — Other Ambulatory Visit: Payer: Self-pay | Admitting: Internal Medicine

## 2020-02-27 DIAGNOSIS — J9611 Chronic respiratory failure with hypoxia: Secondary | ICD-10-CM

## 2020-02-27 DIAGNOSIS — J449 Chronic obstructive pulmonary disease, unspecified: Secondary | ICD-10-CM

## 2020-02-27 MED ORDER — PERFOROMIST 20 MCG/2ML IN NEBU: INHALATION_SOLUTION | RESPIRATORY_TRACT | 2 refills | Status: DC

## 2020-03-22 NOTE — Telephone Encounter (Signed)
Faxed  Order for a nebulizer to [facility] [phone]. Sent note, insurance cards, demographics, and rx per Dr [name]. Please let us know if you need anything.

## 2020-04-02 ENCOUNTER — Encounter: Payer: Self-pay | Admitting: Internal Medicine

## 2020-04-02 NOTE — Patient Instructions (Signed)

## 2020-04-02 NOTE — Progress Notes (Signed)
History of Present Illness:       This very nice 69 y.o.  MWM presents for 6 month follow up with HTN, HLD, Pre-Diabetes and Vitamin D Deficiency. Patient has severe O2 dependent COPD followed by Dr Valeta Harms. Rm Air O2 sat's  range about 85-88%  and then 94% on 3 lit O2.       Patient is followed expectantly for labile  HTN  (1997)  & BP has been controlled at home. Today's BP is at goal - 116/68. Patient has had no complaints of any cardiac type chest pain, palpitations, dyspnea / orthopnea / PND, dizziness, claudication, or dependent edema.      Hyperlipidemia is not controlled with diet. Last Lipids were not at goal:  Lab Results  Component Value Date   CHOL 176 12/29/2019   HDL 43 12/29/2019   LDLCALC 107 (H) 12/29/2019   TRIG 150 (H) 12/29/2019   CHOLHDL 4.1 12/29/2019    Also, the patient has history of PreDiabetes (A1c 6.0% / 2010)  and has had no symptoms of reactive hypoglycemia, diabetic polys, paresthesias or visual blurring.  Last A1c was not at goal:  Lab Results  Component Value Date   HGBA1C 5.8 (H) 09/29/2019       Further, the patient also has history of Vitamin D Deficiency (A1c 6.0% / 2010) and supplements vitamin D without any suspected side-effects. Last vitamin D was near goal (70-100):  Lab Results  Component Value Date   VD25OH 57 09/29/2019    Current Outpatient Medications on File Prior to Visit  Medication Sig  . albuterol (VENTOLIN HFA) 108 (90 Base) MCG/ACT inhaler Inhale 1 to 2 puffs 4 x day or every 4 hours to rescue asthma  . aspirin 81 MG tablet Take 81 mg by mouth every evening.   Marland Kitchen b complex vitamins tablet Take 1 tablet by mouth every evening.   . Cholecalciferol (VITAMIN D3) 5000 units CAPS Take 10,000 Units by mouth daily.   . diphenhydrAMINE (BENADRYL) 25 MG tablet Take 50 mg by mouth daily.   . fluticasone (FLONASE) 50 MCG/ACT nasal spray Place 2 sprays into both nostrils daily. (Patient taking differently: Place 2 sprays into both  nostrils every evening. )  . formoterol (PERFOROMIST) 20 MCG/2ML nebulizer solution Use 2 mls by Inhalation 2 x /day  . guaifenesin (HUMIBID E) 400 MG TABS tablet Take 800 mg by mouth daily.   Marland Kitchen ipratropium (ATROVENT) 0.06 % nasal spray Use 1 to 2 sprays each nostril 2 to 3 x /day as needed  . traZODone (DESYREL) 150 MG tablet Take 1 tablet 1 hour before Bedtime as needed for Sleep  . trolamine salicylate (ASPERCREME) 10 % cream Apply 1 application topically as needed for muscle pain.  Marland Kitchen zinc gluconate 50 MG tablet Take 50 mg by mouth daily.   No current facility-administered medications on file prior to visit.    Allergies  Allergen Reactions  . Niacin And Related Other (See Comments)    Unknown Childhood reaction.    PMHx:   Past Medical History:  Diagnosis Date  . COPD (chronic obstructive pulmonary disease) ([place])   . GERD (gastroesophageal reflux disease)   . History of elevated lipids   . IBS (irritable bowel syndrome)   . Labile hypertension   . Prediabetes   . Secondary polycythemia 05/25/2018  . Tubular adenoma of colon   . Vitamin D deficiency     Immunization History  Administered Date(s) Administered  .  Influenza Split 06/20/2013, 06/20/2014, 06/28/2015  . Influenza, High Dose Seasonal PF 06/17/2017, 05/24/2018, 06/14/2018, 06/29/2019  . Influenza,inj,quad, With Preservative 07/21/2016  . Influenza-Unspecified 06/17/2017  . PFIZER SARS-COV-2 Vaccination 09/26/2019, 10/17/2019  . PPD Test 06/20/2014, 06/28/2015, 07/21/2016  . Pneumococcal Conjugate-13 06/20/2014  . Pneumococcal Polysaccharide-23 04/30/2009, 08/12/2017  . Tdap 09/01/2009    Past Surgical History:  Procedure Laterality Date  . BASAL CELL CARCINOMA EXCISION  2008   Left [place]  . COLONOSCOPY WITH PROPOFOL N/A 10/26/2018   Procedure: COLONOSCOPY WITH PROPOFOL;  Surgeon: Ladene Artist, MD;  Location: [facility];  Service: Endoscopy;  Laterality: N/A;  . TESTICLE SURGERY     testicle removed      FHx:    Reviewed / unchanged  SHx:    Reviewed / unchanged   Systems Review:  Constitutional: Denies fever, chills, wt changes, headaches, insomnia, fatigue, night sweats, change in appetite. Eyes: Denies redness, blurred vision, diplopia, discharge, itchy, watery eyes.  ENT: Denies discharge, congestion, post nasal drip, epistaxis, sore throat, earache, hearing loss, dental pain, tinnitus, vertigo, sinus pain, snoring.  CV: Denies chest pain, palpitations, irregular heartbeat, syncope, dyspnea, diaphoresis, orthopnea, PND, claudication or edema. Respiratory: denies cough, dyspnea, DOE, pleurisy, hoarseness, laryngitis, wheezing.  Gastrointestinal: Denies dysphagia, odynophagia, heartburn, reflux, water brash, abdominal pain or cramps, nausea, vomiting, bloating, diarrhea, constipation, hematemesis, melena, hematochezia  or hemorrhoids. Genitourinary: Denies dysuria, frequency, urgency, nocturia, hesitancy, discharge, hematuria or flank pain. Musculoskeletal: Denies arthralgias, myalgias, stiffness, jt. swelling, pain, limping or strain/sprain.  Skin: Denies pruritus, rash, hives, warts, acne, eczema or change in skin lesion(s). Neuro: No weakness, tremor, incoordination, spasms, paresthesia or pain. Psychiatric: Denies confusion, memory loss or sensory loss. Endo: Denies change in weight, skin or hair change.  Heme/Lymph: No excessive bleeding, bruising or enlarged lymph nodes.  Physical Exam  BP 116/68   Pulse 80   Temp (!) 96.5 F (35.8 C)   Resp 18   Ht 5\' 5"  (1.651 m)   Wt 150 lb 12.8 oz (68.4 kg)   BMI 25.09 kg/m   Appears  well nourished, well groomed  and in no distress.  Eyes: PERRLA, EOMs, conjunctiva no swelling or erythema. Sinuses: No frontal/maxillary tenderness ENT/Mouth: EAC's clear, TM's nl w/o erythema, bulging. Nares clear w/o erythema, swelling, exudates. Oropharynx clear without erythema or exudates. Oral hygiene is good. Tongue normal, non obstructing.  Hearing intact.  Neck: Supple. Thyroid not palpable. Car 2+/2+ without bruits, nodes or JVD. Chest: Respirations nl with BS clear & equal w/o rales, rhonchi, wheezing or stridor.  Cor: Heart sounds normal w/ regular rate and rhythm without sig. murmurs, gallops, clicks or rubs. Peripheral pulses normal and equal  without edema.  Abdomen: Soft & bowel sounds normal. Non-tender w/o guarding, rebound, hernias, masses or organomegaly.  Lymphatics: Unremarkable.  Musculoskeletal: Full ROM all peripheral extremities, joint stability, 5/5 strength and normal gait.  Skin: Warm, dry without exposed rashes, lesions or ecchymosis apparent.  Neuro: Cranial nerves intact, reflexes equal bilaterally. Sensory-motor testing grossly intact. Tendon reflexes grossly intact.  Pysch: Alert & oriented x 3.  Insight and judgement nl & appropriate. No ideations.  Assessment and Plan:  1. Essential hypertension  - Continue medication, monitor blood pressure at home.  - Continue DASH diet.  Reminder to go to the ER if any CP,  SOB, nausea, dizziness, severe HA, changes vision/speech.  - CBC with Differential/Platelet - COMPLETE METABOLIC PANEL WITH GFR - TSH  2. Hyperlipidemia, mixed  - Continue diet/meds, exercise,& lifestyle modifications.  -  Continue monitor periodic cholesterol/liver & renal functions   - Lipid panel - TSH  3. Abnormal glucose  - Continue diet, exercise  - Lifestyle modifications.  - Monitor appropriate labs.  - Hemoglobin A1c - Insulin, random  4. Vitamin D deficiency  - Continue supplementation.  - VITAMIN D 25 Hydroxy  5. 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[place])  - dexamethasone (DECADRON) 4 MG tablet;  Take 1 tab 3 x day - 3 days, then 2 x day - 3 days, then 1 tab daily   Dispense: 20 tablet; Refill: 1  6. Chronic hypoxemic respiratory failure (HCC)  - dexamethasone (DECADRON) 4 MG tablet;  Take 1 tab 3 x day - 3 days, then 2 x day - 3 days, th en 1 tab daily  Dispense: 20  tablet; Refill: 1  7. Medication management  - CBC with Differential/Platelet - COMPLETE METABOLIC PANEL WITH GFR - Magnesium - Lipid panel - TSH - Hemoglobin A1c - Insulin, random - VITAMIN D 25 Hydroxy        Discussed  regular exercise, BP monitoring, weight control to achieve/maintain BMI less than 25 and discussed med and SE's. Recommended labs to assess and monitor clinical status with further disposition pending results of labs.  I discussed the assessment and treatment plan with the patient. The patient was provided an opportunity to ask questions and all were answered. The patient agreed with the plan and demonstrated an understanding of the instructions.  I provided over 30 minutes of exam, counseling, chart review and  complex critical decision making.   Kirtland Bouchard, MD

## 2020-04-03 ENCOUNTER — Other Ambulatory Visit: Payer: Self-pay

## 2020-04-03 ENCOUNTER — Ambulatory Visit (INDEPENDENT_AMBULATORY_CARE_PROVIDER_SITE_OTHER): Payer: Medicare Other | Admitting: Internal Medicine

## 2020-04-03 VITALS — BP 116/68 | HR 80 | Temp 96.5°F | Resp 18 | Ht 65.0 in | Wt 150.8 lb

## 2020-04-03 DIAGNOSIS — R7309 Other abnormal glucose: Secondary | ICD-10-CM | POA: Diagnosis not present

## 2020-04-03 DIAGNOSIS — I10 Essential (primary) hypertension: Secondary | ICD-10-CM | POA: Diagnosis not present

## 2020-04-03 DIAGNOSIS — Z79899 Other long term (current) drug therapy: Secondary | ICD-10-CM

## 2020-04-03 DIAGNOSIS — E559 Vitamin D deficiency, unspecified: Secondary | ICD-10-CM

## 2020-04-03 DIAGNOSIS — E782 Mixed hyperlipidemia: Secondary | ICD-10-CM

## 2020-04-03 DIAGNOSIS — J9611 Chronic respiratory failure with hypoxia: Secondary | ICD-10-CM | POA: Diagnosis not present

## 2020-04-03 DIAGNOSIS — J449 Chronic obstructive pulmonary disease, unspecified: Secondary | ICD-10-CM | POA: Diagnosis not present

## 2020-04-03 MED ORDER — DEXAMETHASONE 4 MG PO TABS: ORAL_TABLET | ORAL | 1 refills | Status: DC

## 2020-04-04 LAB — LIPID PANEL
Cholesterol: 173 mg/dL (ref <200)
HDL: 46 mg/dL (ref >=40)
LDL Cholesterol (Calc): 106 mg/dL (calc) — ABNORMAL HIGH
Non-HDL Cholesterol (Calc): 127 mg/dL (calc) (ref <130)
Total CHOL/HDL Ratio: 3.8 (calc) (ref <5.0)
Triglycerides: 114 mg/dL (ref <150)

## 2020-04-04 LAB — COMPLETE METABOLIC PANEL WITH GFR
AG Ratio: 1.6 (calc) (ref 1.0–2.5)
ALT: 15 U/L (ref 9–46)
AST: 17 U/L (ref 10–35)
Albumin: 4.3 g/dL (ref 3.6–5.1)
Alkaline phosphatase (APISO): 72 U/L (ref 35–144)
BUN: 22 mg/dL (ref 7–25)
CO2: 34 mmol/L — ABNORMAL HIGH (ref 20–32)
Calcium: 9.6 mg/dL (ref 8.6–10.3)
Chloride: 97 mmol/L — ABNORMAL LOW (ref 98–110)
Creat: 0.93 mg/dL (ref 0.70–1.25)
GFR, Est African American: 97 mL/min/{1.73_m2} (ref >=60)
GFR, Est Non African American: 84 mL/min/{1.73_m2} (ref >=60)
Globulin: 2.7 g/dL (calc) (ref 1.9–3.7)
Glucose, Bld: 98 mg/dL (ref 65–99)
Potassium: 4.5 mmol/L (ref 3.5–5.3)
Sodium: 136 mmol/L (ref 135–146)
Total Bilirubin: 0.6 mg/dL (ref 0.2–1.2)
Total Protein: 7 g/dL (ref 6.1–8.1)

## 2020-04-04 LAB — CBC WITH DIFFERENTIAL/PLATELET
Absolute Monocytes: 631 cells/uL (ref 200–950)
Basophils Absolute: 23 cells/uL (ref 0–200)
Basophils Relative: 0.3 %
Eosinophils Absolute: 53 cells/uL (ref 15–500)
Eosinophils Relative: 0.7 %
HCT: 49.2 % (ref 38.5–50.0)
Hemoglobin: 16.6 g/dL (ref 13.2–17.1)
Lymphs Abs: 1566 cells/uL (ref 850–3900)
MCH: 31.1 pg (ref 27.0–33.0)
MCHC: 33.7 g/dL (ref 32.0–36.0)
MCV: 92.1 fL (ref 80.0–100.0)
MPV: 10.6 fL (ref 7.5–12.5)
Monocytes Relative: 8.3 %
Neutro Abs: 5328 cells/uL (ref 1500–7800)
Neutrophils Relative %: 70.1 %
Platelets: 204 10*3/uL (ref 140–400)
RBC: 5.34 10*6/uL (ref 4.20–5.80)
RDW: 11.6 % (ref 11.0–15.0)
Total Lymphocyte: 20.6 %
WBC: 7.6 10*3/uL (ref 3.8–10.8)

## 2020-04-04 LAB — VITAMIN D 25 HYDROXY (VIT D DEFICIENCY, FRACTURES): Vit D, 25-Hydroxy: 73 ng/mL (ref 30–100)

## 2020-04-04 LAB — HEMOGLOBIN A1C
Hgb A1c MFr Bld: 6 % of total Hgb — ABNORMAL HIGH (ref <5.7)
Mean Plasma Glucose: 126 (calc)
eAG (mmol/L): 7 (calc)

## 2020-04-04 LAB — TSH: TSH: 1.1 mIU/L (ref 0.40–4.50)

## 2020-04-04 LAB — INSULIN, RANDOM: Insulin: 28.3 u[IU]/mL — ABNORMAL HIGH

## 2020-04-04 LAB — MAGNESIUM: Magnesium: 2 mg/dL (ref 1.5–2.5)

## 2020-04-04 NOTE — Progress Notes (Signed)
===========================================================  -   Total Chol = 173  Excellent   - Very low risk for Heart Attack  / Stroke =============================================================  - LDL Chol = 106   (Ideal or Goal is less than 70 !  )   - So................. - Recommend  a stricter low cholesterol diet   - Cholesterol only comes from animal sources  - ie. meat, dairy, egg yolks  - Eat all the vegetables you want.  - Avoid meat, especially red meat - Beef AND Pork .  - Avoid cheese & dairy - milk & ice cream.     - Cheese is the most concentrated form of trans-fats which  is the worst thing to clog up our arteries.   - Veggie cheese is OK which can be found in the fresh  produce section at [employer] or [employer] or [employer] ===========================================================  - A1c - up slightly from 5.85 to now 6.0% - Steroids can raise the Blood Sugar   - Avoid Sweets, Candy & White Stuff   - Rice, Potatoes, Breads &  Pasta ===========================================================  - Vitamin D = 73 - Excellent  ===========================================================  - All Else - CBC - Kidneys - Electrolytes - Liver - Magnesium & Thyroid    - all  Normal / OK ===========================================================

## 2020-05-09 ENCOUNTER — Other Ambulatory Visit: Payer: Self-pay | Admitting: Internal Medicine

## 2020-05-09 DIAGNOSIS — G4700 Insomnia, unspecified: Secondary | ICD-10-CM

## 2020-06-13 ENCOUNTER — Ambulatory Visit (INDEPENDENT_AMBULATORY_CARE_PROVIDER_SITE_OTHER): Payer: Medicare Other | Admitting: Pulmonary Disease

## 2020-06-13 ENCOUNTER — Other Ambulatory Visit: Payer: Self-pay

## 2020-06-13 ENCOUNTER — Encounter: Payer: Self-pay | Admitting: Pulmonary Disease

## 2020-06-13 ENCOUNTER — Telehealth (HOSPITAL_COMMUNITY): Payer: Self-pay | Admitting: *Deleted

## 2020-06-13 VITALS — BP 122/78 | HR 93 | Temp 97.1°F | Ht 65.0 in | Wt 152.4 lb

## 2020-06-13 DIAGNOSIS — J449 Chronic obstructive pulmonary disease, unspecified: Secondary | ICD-10-CM | POA: Diagnosis not present

## 2020-06-13 DIAGNOSIS — D751 Secondary polycythemia: Secondary | ICD-10-CM

## 2020-06-13 DIAGNOSIS — Z23 Encounter for immunization: Secondary | ICD-10-CM | POA: Diagnosis not present

## 2020-06-13 DIAGNOSIS — R911 Solitary pulmonary nodule: Secondary | ICD-10-CM

## 2020-06-13 DIAGNOSIS — J9611 Chronic respiratory failure with hypoxia: Secondary | ICD-10-CM | POA: Diagnosis not present

## 2020-06-13 MED ORDER — YUPELRI 175 MCG/3ML IN SOLN: 175.0000 ug | Freq: Every day | RESPIRATORY_TRACT | 11 refills | Status: DC

## 2020-06-13 MED ORDER — BUDESONIDE 0.5 MG/2ML IN SUSP
0.5000 mg | Freq: Two times a day (BID) | RESPIRATORY_TRACT | 11 refills | Status: DC
Start: 2020-06-13 — End: 2021-05-23

## 2020-06-13 NOTE — Progress Notes (Signed)
Synopsis: Referred in September 2019 for severe COPD by [name], MD  Subjective:   PATIENT ID: Douglas Barrera GENDER: male DOB: 20-Jul-1951, MRN: [id number]  Chief Complaint  Patient presents with  . Follow-up    productive cough, clear sputum, denies sob    He was diagnosed with COPD about 3-4 years ago. He quit smoking a week ago after being placed on oxygen. He feels much better after being placed on O2. He has much more energy after start O2. He has never been hospitalized for an exacerbation. Smoked for 40 years, 1 ppd.  Patient denies weight loss, fevers, chills, night sweats.  He does have sputum production.  He is able to complete most of his activities of daily living.  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  He used to travel around to local bakery's as well as a crossed many states to teach institutions and bakeries how to bake certain tasks.  He is able to go to the grocery store, pushes on buggy as well as mow his grass while riding a lawnmower.  OV 07/07/2018: Patient been doing well since last office visit.  He is trying to obtain POC.  Insurance has required requalification for O2 needs.  Presents today for walking in the office for that.  He has been using his new nebulized medications.  He is not really sure he seen much difference but he does think that his functional status is good.  Today he plans to go home and mow the grass on riding mower.  He is able to go to the grocery store.  He still is able to work in his kitchen and bake.  Overall doing well.  OV 10/13/2018: This is a 69 year old with very severe COPD.  Overall has been doing well for the past several months.  He had a low-dose lung cancer screening CT which revealed coronary calcifications and no evidence or/concern of a underlying malignancy.  This is being done by his primary care provider."  Radiology recommended a one-year follow-up.  He does have recent evaluation by cardiology that plans a CT a coronary evaluation.   Overall he has been doing well and has no significant complaints today.  He enjoys using his nebulizers.  He plans to pick a cherry pie this weekend for his wife/Valentine's Day.  As for his current respiratory symptoms he maintains with some dyspnea on exertion.  He does have a planned colonoscopy for next week.  OV 06/30/2019: Patient doing very well today he has very severe COPD at baseline.  He was on nebulized LAMA LABA however currently only using Perforomist and as needed duo nebs.  He is stable on 3 L nasal cannula.  Able to complete most of his activities of daily living.  States that he mowed the grass last week with no trouble.  Trying to be as active as possible.  He is planning a trip to go across the country driving.  He would like to attempt to be approved for a POC concentrator that would help his travel significantly.  Having a car adapter would make this travel much simpler.  He will also plan to travel with his oxygen tanks.  He has a SpO2 device that he checks his sats regularly.  Otherwise no significant change in his respiratory symptoms baseline.  OV 12/21/19: Here for follow up regarding COPD.  Overall no complaints today.  He is at his standard baseline of 3 L.  O2 sat 89% today in the office.  He does check his sats regularly at home and usually there stable in the high 80s to low 90s.  He is able to complete all of his activities of daily living.  He has 2 new kittens in the home.  He does have some seasonal allergies that has been taking over-the-counter antihistamines for.  He does have some questions about his oxygen needs from [facility].  They requested a recertification.  Work on a ensure that he has this completed today in the office with no issues from home health agency.  OV 06/13/2020: Patient here today for follow-up regarding severe COPD.  He is currently only using Perforomist.  Has not continued use of his other triple therapy inhalers.  He did have an exacerbation 2  months ago was placed on steroids by his primary care provider.  He is trying to stay active as he could.  His primary care provider recommended talking to me today about increasing his exercise options.  We also discussed pulmonary rehabilitation referral.  He is worried about having to drive all the way to [facility] for this if he can have something done in [place] that would make life simpler.  He has still been baking.  Recently picked a batch of [place] bars for a family get together.  From a respiratory standpoint his dyspnea on exertion is still present.  He feels like he is a little setback from where he was after his exacerbation 2 months ago not quite back at baseline.  He would like to become a little bit more active.  He does have sputum production clear whitish at times.  No other discoloration.  No fevers chills night sweats weight loss.   Past Medical History:  Diagnosis Date  . COPD (chronic obstructive pulmonary disease) ([place])   . GERD (gastroesophageal reflux disease)   . History of elevated lipids   . IBS (irritable bowel syndrome)   . Labile hypertension   . Prediabetes   . Secondary polycythemia 05/25/2018  . Tubular adenoma of colon   . Vitamin D deficiency      Family History  Problem Relation Age of Onset  . Breast cancer Mother   . Stomach cancer Mother      Past Surgical History:  Procedure Laterality Date  . BASAL CELL CARCINOMA EXCISION  2008   Left [place]  . COLONOSCOPY WITH PROPOFOL N/A 10/26/2018   Procedure: COLONOSCOPY WITH PROPOFOL;  Surgeon: Ladene Artist, MD;  Location: [facility];  Service: Endoscopy;  Laterality: N/A;  . TESTICLE SURGERY     testicle removed    Social History   Socioeconomic History  . Marital status: Married    Spouse name: Not on file  . Number of children: 0  . Years of education: Not on file  . Highest education level: Not on file  Occupational History  . Occupation: retired  Tobacco Use  . Smoking status:  Former Smoker    Packs/day: 2.00    Years: 49.00    Pack years: 98.00    Types: Cigars, Cigarettes    Start date: 10/17/1968    Quit date: 08/18/2018    Years since quitting: 1.8  . Smokeless tobacco: Never Used  . Tobacco comment: quit smoking cigarettes in Dec 2019 but do backslide occasional, but smokes 3-4 cigars a week. small sigar on occasion 10/13/18  Substance and Sexual Activity  . Alcohol use: No    Alcohol/week: 0.0 standard drinks  . Drug use: No  . Sexual activity:  Not on file  Other Topics Concern  . Not on file  Social History Narrative  . Not on file   Social Determinants of Health   Financial Resource Strain:   . Difficulty of Paying Living Expenses: Not on file  Food Insecurity:   . Worried About Charity fundraiser in the Last Year: Not on file  . Ran Out of Food in the Last Year: Not on file  Transportation Needs:   . Lack of Transportation (Medical): Not on file  . Lack of Transportation (Non-Medical): Not on file  Physical Activity:   . Days of Exercise per Week: Not on file  . Minutes of Exercise per Session: Not on file  Stress:   . Feeling of Stress : Not on file  Social Connections:   . Frequency of Communication with Friends and Family: Not on file  . Frequency of Social Gatherings with Friends and Family: Not on file  . Attends Religious Services: Not on file  . Active Member of Clubs or Organizations: Not on file  . Attends Archivist Meetings: Not on file  . Marital Status: Not on file  Intimate Partner Violence:   . Fear of Current or Ex-Partner: Not on file  . Emotionally Abused: Not on file  . Physically Abused: Not on file  . Sexually Abused: Not on file     Allergies  Allergen Reactions  . Niacin And Related Other (See Comments)    Unknown Childhood reaction.     Outpatient Medications Prior to Visit  Medication Sig Dispense Refill  . albuterol (VENTOLIN HFA) 108 (90 Base) MCG/ACT inhaler Inhale 1 to 2 puffs 4 x day  or every 4 hours to rescue asthma 18 g 1  . aspirin 81 MG tablet Take 81 mg by mouth every evening.     Marland Kitchen b complex vitamins tablet Take 1 tablet by mouth every evening.     . Cholecalciferol (VITAMIN D3) 5000 units CAPS Take 10,000 Units by mouth daily.     Marland Kitchen dexamethasone (DECADRON) 4 MG tablet Take 1 tab 3 x day - 3 days, then 2 x day - 3 days, then 1 tab daily 20 tablet 1  . diphenhydrAMINE (BENADRYL) 25 MG tablet Take 50 mg by mouth daily.     . fluticasone (FLONASE) 50 MCG/ACT nasal spray Place 2 sprays into both nostrils daily. (Patient taking differently: Place 2 sprays into both nostrils every evening. ) 48 g 3  . formoterol (PERFOROMIST) 20 MCG/2ML nebulizer solution Use 2 mls by Inhalation 2 x /day 360 mL 2  . guaifenesin (HUMIBID E) 400 MG TABS tablet Take 800 mg by mouth daily.     Marland Kitchen ipratropium (ATROVENT) 0.06 % nasal spray Use 1 to 2 sprays each nostril 2 to 3 x /day as needed 45 mL 3  . traZODone (DESYREL) 150 MG tablet TAKE 1 TABLET 1 HOUR BEFORE BEDTIME AS NEEDED FOR SLEEP 90 tablet 3  . trolamine salicylate (ASPERCREME) 10 % cream Apply 1 application topically as needed for muscle pain.    Marland Kitchen zinc gluconate 50 MG tablet Take 50 mg by mouth daily.     No facility-administered medications prior to visit.    Review of Systems  Constitutional: Negative for chills, fever, malaise/fatigue and weight loss.  HENT: Negative for hearing loss, sore throat and tinnitus.   Eyes: Negative for blurred vision and double vision.  Respiratory: Positive for cough, sputum production and shortness of breath. Negative for hemoptysis, wheezing  and stridor.   Cardiovascular: Negative for chest pain, palpitations, orthopnea, leg swelling and PND.  Gastrointestinal: Negative for abdominal pain, constipation, diarrhea, heartburn, nausea and vomiting.  Genitourinary: Negative for dysuria, hematuria and urgency.  Musculoskeletal: Negative for joint pain and myalgias.  Skin: Negative for itching and  rash.  Neurological: Negative for dizziness, tingling, weakness and headaches.  Endo/Heme/Allergies: Negative for environmental allergies. Does not bruise/bleed easily.  Psychiatric/Behavioral: Negative for depression. The patient is not nervous/anxious and does not have insomnia.   All other systems reviewed and are negative.    Objective:  Physical Exam Vitals reviewed.  Constitutional:      General: He is not in acute distress.    Appearance: He is well-developed.  HENT:     Head: Normocephalic and atraumatic.     Mouth/Throat:     Pharynx: No oropharyngeal exudate.  Eyes:     Conjunctiva/sclera: Conjunctivae normal.     Pupils: Pupils are equal, round, and reactive to light.  Neck:     Vascular: No JVD.     Trachea: No tracheal deviation.     Comments: Loss of supraclavicular fat Cardiovascular:     Rate and Rhythm: Normal rate and regular rhythm.     Heart sounds: S1 normal and S2 normal.     Comments: Distant heart tones Pulmonary:     Effort: No tachypnea or accessory muscle usage.     Breath sounds: No stridor. Decreased breath sounds (throughout all lung fields) present. No wheezing, rhonchi or rales.     Comments: Diminished breath sounds bilaterally Abdominal:     General: Bowel sounds are normal. There is no distension.     Palpations: Abdomen is soft.     Tenderness: There is no abdominal tenderness.  Musculoskeletal:        General: Deformity (muscle wasting ) present.  Skin:    General: Skin is warm and dry.     Capillary Refill: Capillary refill takes less than 2 seconds.     Findings: No rash.  Neurological:     Mental Status: He is alert and oriented to person, place, and time.  Psychiatric:        Behavior: Behavior normal.      Vitals:   06/13/20 1123  BP: 122/78  Pulse: 93  Temp: (!) 97.1 F (36.2 C)  TempSrc: Skin  SpO2: 92%  Weight: 152 lb 6.4 oz (69.1 kg)  Height: 5\' 5"  (1.651 m)   92% on 3 L nasal cannula BMI Readings from Last 3  Encounters:  06/13/20 25.36 kg/m  04/03/20 25.09 kg/m  12/29/19 25.79 kg/m   Wt Readings from Last 3 Encounters:  06/13/20 152 lb 6.4 oz (69.1 kg)  04/03/20 150 lb 12.8 oz (68.4 kg)  12/29/19 155 lb (70.3 kg)     CBC    Component Value Date/Time   WBC 7.6 04/03/2020 0918   RBC 5.34 04/03/2020 0918   HGB 16.6 04/03/2020 0918   HGB 16.8 08/19/2018 1308   HCT 49.2 04/03/2020 0918   PLT 204 04/03/2020 0918   PLT 194 08/19/2018 1308   MCV 92.1 04/03/2020 0918   MCH 31.1 04/03/2020 0918   MCHC 33.7 04/03/2020 0918   RDW 11.6 04/03/2020 0918   LYMPHSABS 1,566 04/03/2020 0918   MONOABS 1.3 (H) 08/19/2018 1308   EOSABS 53 04/03/2020 0918   BASOSABS 23 04/03/2020 0918    Chest Imaging: 08/26/2017 CT chest lung cancer screening, LD CT Lung-RADS 1 radiology, recommending annual continued screening. Evidence  of mild bronchial thickening, scattered areas of groundglass, areas of scarring. The patient's images have been independently reviewed by me.   09/15/2018: LDCT -  LUNG RADS 2S, with coronary calcifications  The patient's images have been independently reviewed by me.    11/01/2019 LDCT: LUNG RAD 2, continuing annual follow up, small subcentimeter nodule 3mm  The patient's images have been independently reviewed by me.       Pulmonary Functions Testing Results: PFT Results Latest Ref Rng & Units 05/31/2018 03/24/2018  FVC-Pre L 1.83 1.72  FVC-Predicted Pre % 48 45  FVC-Post L 1.89 -  FVC-Predicted Post % 50 -  Pre FEV1/FVC % % 46 36  Post FEV1/FCV % % 34 -  FEV1-Pre L 0.84 0.61  FEV1-Predicted Pre % 30 22  FEV1-Post L 0.65 -  DLCO uncorrected ml/min/mmHg 8.83 7.09  DLCO UNC% % 34 27  DLCO corrected ml/min/mmHg 7.96 6.28  DLCO COR %Predicted % 31 24  DLVA Predicted % 52 49  TLC L - 5.85  TLC % Predicted % - 97  RV % Predicted % - 198    FeNO: None   Pathology: None   Echocardiogram: None   Heart Catheterization: None     Assessment & Plan:   COPD, very  severe ([place]) - Plan: AMB referral to pulmonary rehabilitation, Ambulatory Referral for Lung Cancer Scre  Chronic hypoxemic respiratory failure (H[facility])  Secondary polycythemia  Left upper lobe pulmonary nodule - Plan: Ambulatory Referral for Lung Cancer Scre  Flu vaccine need   Discussion:  This is a 69 year old gentleman with very severe COPD, FEV1 22% predicted, chronic hypoxemic respiratory failure on nasal cannula O2 supplementation, 3 L pulsed therapy.  He has a good functional status with an mMRC of 1.  Remains tobacco free at this time.  He does have yearly lung cancer screening CTs that have been ordered by primary care in the past.  He has also had a recent COPD exacerbation.  Does feel somewhat more fatigued and not quite at his baseline.  This is consistent with severity of his lung disease and progression of symptomatology related to his COPD.  Plan: I would prefer to see the patient on triple therapy regimen to include nebulized steroid, LAMA, LABA. He can continue the use of albuterol as needed New orders placed for Pulmicort 250 mcg twice daily, plus Yupelri once daily He can continue the use of his Perforomist. Patient would prefer to stay on nebulized therapy instead of inhaler regimen. Referral placed to our lung cancer screening program to help coordinate his yearly scans. Continue DME supply orders for oxygen therapy. Referral placed to pulmonary rehab.  He would like to do this in [place] if possible. Flu shot today Patient return to clinic in 6 months.   Current Outpatient Medications:  .  albuterol (VENTOLIN HFA) 108 (90 Base) MCG/ACT inhaler, Inhale 1 to 2 puffs 4 x day or every 4 hours to rescue asthma, Disp: 18 g, Rfl: 1 .  aspirin 81 MG tablet, Take 81 mg by mouth every evening. , Disp: , Rfl:  .  b complex vitamins tablet, Take 1 tablet by mouth every evening. , Disp: , Rfl:  .  Cholecalciferol (VITAMIN D3) 5000 units CAPS, Take 10,000 Units by mouth  daily. , Disp: , Rfl:  .  dexamethasone (DECADRON) 4 MG tablet, Take 1 tab 3 x day - 3 days, then 2 x day - 3 days, then 1 tab daily, Disp: 20 tablet, Rfl: 1 .  diphenhydrAMINE (BENADRYL) 25 MG tablet, Take 50 mg by mouth daily. , Disp: , Rfl:  .  fluticasone (FLONASE) 50 MCG/ACT nasal spray, Place 2 sprays into both nostrils daily. (Patient taking differently: Place 2 sprays into both nostrils every evening. ), Disp: 48 g, Rfl: 3 .  formoterol (PERFOROMIST) 20 MCG/2ML nebulizer solution, Use 2 mls by Inhalation 2 x /day, Disp: 360 mL, Rfl: 2 .  guaifenesin (HUMIBID E) 400 MG TABS tablet, Take 800 mg by mouth daily. , Disp: , Rfl:  .  ipratropium (ATROVENT) 0.06 % nasal spray, Use 1 to 2 sprays each nostril 2 to 3 x /day as needed, Disp: 45 mL, Rfl: 3 .  traZODone (DESYREL) 150 MG tablet, TAKE 1 TABLET 1 HOUR BEFORE BEDTIME AS NEEDED FOR SLEEP, Disp: 90 tablet, Rfl: 3 .  trolamine salicylate (ASPERCREME) 10 % cream, Apply 1 application topically as needed for muscle pain., Disp: , Rfl:  .  zinc gluconate 50 MG tablet, Take 50 mg by mouth daily., Disp: , Rfl:   I spent 32 minutes dedicated to the care of this patient on the date of this encounter to include pre-visit review of records, face-to-face time with the patient discussing conditions above, post visit ordering of testing, clinical documentation with the electronic health record, making appropriate referrals as documented, and communicating necessary findings to members of the patients care team.    Garner Nash, DO [facility] 06/13/2020 11:32 AM

## 2020-06-13 NOTE — Telephone Encounter (Signed)
Received referral notification from Dr. Valeta Harms for this pt to participate in pulmonary rehab.  Called and left message for pt.  Will advise of alternative facilitiy for pulmonary rehab that may be closer to [place]. Cherre Huger, BSN Cardiac and Training and development officer

## 2020-06-13 NOTE — Patient Instructions (Addendum)
Thank you for visiting Dr. Valeta Harms at [facility]. Today we recommend the following:  Orders Placed This Encounter  Procedures  . AMB referral to pulmonary rehabilitation   Neb orders for Pulmicort 259mcg BID and Yupelri once daily  Return in about 6 months (around 12/12/2020).    Please do your part to reduce the spread of COVID-19.

## 2020-06-14 ENCOUNTER — Telehealth: Payer: Self-pay | Admitting: Pulmonary Disease

## 2020-06-14 DIAGNOSIS — J9611 Chronic respiratory failure with hypoxia: Secondary | ICD-10-CM

## 2020-06-14 NOTE — Telephone Encounter (Signed)
Spoke with Carlette at pulmonary rehab. She is requesting the diagnosis code to be changed on the pt's pulmonary rehab note. If COPD is used, with him having Medicare he will have to have a face to face evaluation every 30 days while at pulmonary rehab. Per Carlette, they do not have the staff at this time to accomodate that. She saw in Dr. Juline Patch note, chronic respiratory failure. States that this diagnosis would work.  Dr. Valeta Harms - please advise. Thanks.

## 2020-06-14 NOTE — Telephone Encounter (Signed)
Ok to change to chronic hypoxemic respiratory failure Douglas Nash, DO [facility] 06/14/2020 5:46 PM

## 2020-06-15 ENCOUNTER — Telehealth: Payer: Self-pay | Admitting: Pulmonary Disease

## 2020-06-15 NOTE — Telephone Encounter (Signed)
Will call on 10/18 bc [name] is closed

## 2020-06-15 NOTE — Telephone Encounter (Signed)
New referral placed for rehab under new dx  Nothing further needed

## 2020-06-18 NOTE — Telephone Encounter (Signed)
Dr. Valeta Harms, please see pt's mychart message and advise.

## 2020-06-18 NOTE — Telephone Encounter (Signed)
Douglas Barrera, gave dx code for COPD. Nothing further needed.

## 2020-06-19 DIAGNOSIS — Z23 Encounter for immunization: Secondary | ICD-10-CM | POA: Diagnosis not present

## 2020-06-25 ENCOUNTER — Encounter (HOSPITAL_COMMUNITY): Payer: Self-pay | Admitting: *Deleted

## 2020-06-25 NOTE — Progress Notes (Signed)
Received referral from [facility] for this pt to participate in pulmonary rehab with the the diagnosis of Chronic Hypoxemic Respiratory Failure. Clinical review of pt follow up appt on 10/13 Pulmonary office note.  Pt with Covid Risk Score - 5. Pt appropriate for scheduling for Pulmonary rehab.  Will forward to support staff for scheduling and verification of insurance eligibility/benefits with pt consent. Cherre Huger, BSN Cardiac and Training and development officer

## 2020-07-05 NOTE — Progress Notes (Signed)
3 MONTH FOLLOW UP Assessment:    Labile hypertension Controlled off of medications Monitor blood pressure at home; call if consistently over 130/80 Continue DASH diet.   Reminder to go to the ER if any CP, SOB, nausea, dizziness, severe HA, changes vision/speech, left arm numbness and tingling and jaw pain.  Coronary atherosclerosis due to calcified coronary lesion Per CT, pending coronary calcium score by Dr. Gwenlyn Found, patient declining for now Control blood pressure, cholesterol, glucose, increase exercise.  Discussed LDL goal <70 and initiated low dose statin today   Aortic atherosclerosis (HCC) Per numerous CTs Control blood pressure, cholesterol, glucose, increase exercise.   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; continue neb/inhalers; O2; counseled to stop smoking  Chronic hypoxemic respiratory failure ([place]) On O2; monitor   Gastroesophageal reflux disease, esophagitis presence not specified Well managed on current medications Discussed diet, avoiding triggers and other lifestyle changes  Vitamin D deficiency At goal at recent check; continue to recommend supplementation for goal of 60-100 Defer vitamin D level  Smoker Rare, encouraged cessation; neg CT screening 10/2019  Other abnormal glucose (prediabetes) Discussed disease and risks Discussed diet/exercise, weight management  A1C q40m; check CMP otherwise  Medication management CBC, CMP/GFR, magnesium   Hyperlipidemia Pending coronary calcium study by Dr. Gwenlyn Found to determine LDL goal; patient declines until pandemic improves,  Discussed LDL goal <70 for risk reduction, start rosuvastatin 5 mg three days a week.  Continue low cholesterol diet and exercise.  Check lipid panel.   BMI 25 Continue to recommend diet heavy in fruits and veggies and low in animal meats, cheeses, and dairy products, appropriate calorie intake Discuss exercise recommendations routinely Continue to monitor weight at each  visit  Over 30 minutes of exam, counseling, chart review, and critical decision making was performed  Future Appointments  Date Time Provider [place]  10/15/2020 11:00 AM [name], MD [facility] None  01/15/2021  9:00 AM Garnet Sierras, NP [facility] None    Subjective:  Douglas Barrera is a 69 y.o. male who presents for 3 month follow up for HTN, hyperlipidemia, prediabetes, and vitamin D Def.   Patient has severe COPD consequent of years of smoking, controlled with MDI  & nebulized meds (perforomist, yupelri, budesonide) and guaifenesin.  He is followed by Dr Carney Corners (Pulmonary), on continuous Oxygen therapy.  He moitors O2 sats and regulate his O2 flow rate between 2 - 3 l/min to keep his O2 sats in the low 90's range. He does admit still occasionally "sneaking " a cigar (outside away from his O2).   Ct lung in 10/2019 showed no concerning nodules, did show emphysematous changes and aortic atherosclerosis, 3 vessel CAD. He was evaluated by Dr. Gwenlyn Found in 2020 for this who had planned coronary calcium study but was having problems with insurance, then deferred due to pandemic per strong patient preference.   Trazodone 150 mg daily at night for sleep and endorses restorative sleep.   BMI is Body mass index is 25.29 kg/m., he has been working on diet, pushes fresh vegetables and fruits, avoids processed foods, cooks at home most of the time.  Walks 15-20, 3-4 days a week.  Wt Readings from Last 3 Encounters:  07/06/20 152 lb (68.9 kg)  06/13/20 152 lb 6.4 oz (69.1 kg)  04/03/20 150 lb 12.8 oz (68.4 kg)   His blood pressure has been controlled at home, today their BP is BP: 108/62  He does workout. He denies chest pain, shortness of breath (none  if he stays on O2) dizziness.   He is not on cholesterol medication and denies myalgias. His cholesterol is not at goal. The cholesterol last visit was:   Lab Results  Component Value Date   CHOL 173 04/03/2020   HDL 46  04/03/2020   LDLCALC 106 (H) 04/03/2020   TRIG 114 04/03/2020   CHOLHDL 3.8 04/03/2020   He has not been working on diet and exercise for prediabetes, admits he eats too many sweets, and denies increased appetite, nausea, paresthesia of the feet, polydipsia, polyuria and visual disturbances. Last A1C in the office was:  Lab Results  Component Value Date   HGBA1C 6.0 (H) 04/03/2020   Last GFR Lab Results  Component Value Date   GFRNONAA 84 04/03/2020    Patient is on Vitamin D supplement and near goal at recent check:    Lab Results  Component Value Date   VD25OH 73 04/03/2020       Medication Review:  Current Outpatient Medications (Endocrine & Metabolic):  .  dexamethasone (DECADRON) 4 MG tablet, Take 1 tab 3 x day - 3 days, then 2 x day - 3 days, then 1 tab daily   Current Outpatient Medications (Respiratory):  .  albuterol (VENTOLIN HFA) 108 (90 Base) MCG/ACT inhaler, Inhale 1 to 2 puffs 4 x day or every 4 hours to rescue asthma .  budesonide (PULMICORT) 0.5 MG/2ML nebulizer solution, Take 2 mLs (0.5 mg total) by nebulization 2 (two) times daily. .  diphenhydrAMINE (BENADRYL) 25 MG tablet, Take 50 mg by mouth daily.  .  fluticasone (FLONASE) 50 MCG/ACT nasal spray, Place 2 sprays into both nostrils daily. (Patient taking differently: Place 2 sprays into both nostrils every evening. ) .  formoterol (PERFOROMIST) 20 MCG/2ML nebulizer solution, Use 2 mls by Inhalation 2 x /day .  guaifenesin (HUMIBID E) 400 MG TABS tablet, Take 800 mg by mouth daily.  Marland Kitchen  ipratropium (ATROVENT) 0.06 % nasal spray, Use 1 to 2 sprays each nostril 2 to 3 x /day as needed .  revefenacin (YUPELRI) 175 MCG/3ML nebulizer solution, Take 3 mLs (175 mcg total) by nebulization daily.  Current Outpatient Medications (Analgesics):  .  aspirin 81 MG tablet, Take 81 mg by mouth every evening.    Current Outpatient Medications (Other):  .  b complex vitamins tablet, Take 1 tablet by mouth every evening.  .   Cholecalciferol (VITAMIN D3) 5000 units CAPS, Take 10,000 Units by mouth daily.  .  traZODone (DESYREL) 150 MG tablet, TAKE 1 TABLET 1 HOUR BEFORE BEDTIME AS NEEDED FOR SLEEP .  trolamine salicylate (ASPERCREME) 10 % cream, Apply 1 application topically as needed for muscle pain. Marland Kitchen  zinc gluconate 50 MG tablet, Take 50 mg by mouth daily.  Allergies: Allergies  Allergen Reactions  . Niacin And Related Other (See Comments)    Unknown Childhood reaction.    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  Patient Care Team: [name], MD as PCP - General (Internal Medicine) Ladene Artist, MD as Consulting Physician (Gastroenterology) Garner Nash, DO as Consulting Physician (Pulmonary Disease)  Surgical: He  has a past surgical history that includes Testicle surgery; Excision basal cell carcinoma (2008); and Colonoscopy with propofol (N/A,  10/26/2018). Family His family history includes Breast cancer in his mother; Stomach cancer in his mother. Social history  He reports that he quit smoking about 22 months ago. His smoking use included cigars and cigarettes. He started smoking about 51 years ago. He has a 98.00 pack-year smoking history. He has never used smokeless tobacco. He reports that he does not drink alcohol and does not use drugs.   Review of Systems  Constitutional: Negative for malaise/fatigue and weight loss.  HENT: Negative for hearing loss and tinnitus.   Eyes: Negative for blurred vision and double vision.  Respiratory: Negative for cough, shortness of breath and  wheezing.   Cardiovascular: Negative for chest pain, palpitations, orthopnea, claudication and leg swelling.  Gastrointestinal: Negative for abdominal pain, blood in stool, constipation, diarrhea, heartburn, melena, nausea and vomiting.  Genitourinary: Negative.   Musculoskeletal: Negative for joint pain and myalgias.  Skin: Negative for rash.  Neurological: Negative for dizziness, tingling, sensory change, weakness and headaches.  Endo/Heme/Allergies: Negative for polydipsia.  Psychiatric/Behavioral: Negative.   All other systems reviewed and are negative.    Objective:   Today's Vitals   07/06/20 0858  BP: 108/62  Pulse: 83  Temp: (!) 97.3 F (36.3 C)  SpO2: 94%  Weight: 152 lb (68.9 kg)   Body mass index is 25.29 kg/m.  General Appearance:  in no apparent distress. Eyes: PERRLA, EOMs, conjunctiva no swelling or erythema Sinuses: No Frontal/maxillary tenderness ENT/Mouth: Ext aud canals clear, TMs without erythema, bulging. No erythema, swelling, or exudate on post pharynx. Hearing normal.  Neck: Supple, thyroid normal.  Respiratory: Respiratory effort normal, Decreased breath sounds throughout without rales, rhonchi, wheezing or stridor. Patient on [place] with 3 L. Cardio: RRR with no MRGs, distant heart sounds. Brisk peripheral pulses with mild edema bilaterally Abdomen: Soft, + BS.  Non tender, no guarding, rebound, hernias, masses. Lymphatics: Non tender without lymphadenopathy.  Musculoskeletal: Full ROM, 4/5 strength diffuse decreased muscle tone, Normal gait Skin: Warm, dry without rashes, lesions, ecchymosis.  Neuro: Cranial nerves intact. No cerebellar symptoms.  Psych: Awake and oriented X 3, normal affect, Insight and Judgment appropriate.     Izora Ribas, NP   07/06/2020

## 2020-07-06 ENCOUNTER — Encounter: Payer: Self-pay | Admitting: Adult Health

## 2020-07-06 ENCOUNTER — Ambulatory Visit (INDEPENDENT_AMBULATORY_CARE_PROVIDER_SITE_OTHER): Payer: Medicare Other | Admitting: Adult Health

## 2020-07-06 ENCOUNTER — Other Ambulatory Visit: Payer: Self-pay

## 2020-07-06 VITALS — BP 108/62 | HR 83 | Temp 97.3°F | Wt 152.0 lb

## 2020-07-06 DIAGNOSIS — I2510 Atherosclerotic heart disease of native coronary artery without angina pectoris: Secondary | ICD-10-CM | POA: Diagnosis not present

## 2020-07-06 DIAGNOSIS — E782 Mixed hyperlipidemia: Secondary | ICD-10-CM | POA: Diagnosis not present

## 2020-07-06 DIAGNOSIS — J449 Chronic obstructive pulmonary disease, unspecified: Secondary | ICD-10-CM

## 2020-07-06 DIAGNOSIS — E663 Overweight: Secondary | ICD-10-CM

## 2020-07-06 DIAGNOSIS — Z79899 Other long term (current) drug therapy: Secondary | ICD-10-CM | POA: Diagnosis not present

## 2020-07-06 DIAGNOSIS — F17200 Nicotine dependence, unspecified, uncomplicated: Secondary | ICD-10-CM | POA: Diagnosis not present

## 2020-07-06 DIAGNOSIS — Z6825 Body mass index (BMI) 25.0-25.9, adult: Secondary | ICD-10-CM | POA: Diagnosis not present

## 2020-07-06 DIAGNOSIS — J9611 Chronic respiratory failure with hypoxia: Secondary | ICD-10-CM | POA: Diagnosis not present

## 2020-07-06 DIAGNOSIS — E559 Vitamin D deficiency, unspecified: Secondary | ICD-10-CM

## 2020-07-06 DIAGNOSIS — I700 Atherosclerosis of aorta: Secondary | ICD-10-CM | POA: Diagnosis not present

## 2020-07-06 DIAGNOSIS — R0989 Other specified symptoms and signs involving the circulatory and respiratory systems: Secondary | ICD-10-CM | POA: Diagnosis not present

## 2020-07-06 DIAGNOSIS — R7309 Other abnormal glucose: Secondary | ICD-10-CM

## 2020-07-06 MED ORDER — ROSUVASTATIN CALCIUM 5 MG PO TABS: ORAL_TABLET | ORAL | 3 refills | Status: DC

## 2020-07-06 NOTE — Patient Instructions (Addendum)
Goals    . LDL CALC < 70    . Quit smoking / using tobacco        Due to known aortic plaque and coronary artery plaque, recommend LDL goal of <70. Sent in rosuvastatin 5 mg tabs to try - start taking 3 nights a week.   Monitor for muscle aches (most common side effect) - about 10% of population may have this is dose is too high for you- contact me if you notice anything unusual   Try to work on dessert/sweets portions or frequency - for diabetes Elevated sugars also increase cardiovascular risk (heart attacks and strokes)    Rosuvastatin Tablets What is this medicine? ROSUVASTATIN (roe SOO va sta tin) is known as a HMG-CoA reductase inhibitor or 'statin'. It lowers cholesterol and triglycerides in the blood. This drug may also reduce the risk of heart attack, stroke, or other health problems in patients with risk factors for heart disease. Diet and lifestyle changes are often used with this drug. This medicine may be used for other purposes; ask your health care provider or pharmacist if you have questions. COMMON BRAND NAME(S): Crestor What should I tell my health care provider before I take this medicine? They need to know if you have any of these conditions:  diabetes  if you often drink alcohol  history of stroke  kidney disease  liver disease  muscle aches or weakness  thyroid disease  an unusual or allergic reaction to rosuvastatin, other medicines, foods, dyes, or preservatives  pregnant or trying to get pregnant  breast-feeding How should I use this medicine? Take this medicine by mouth with a glass of water. Follow the directions on the prescription label. Do not cut, crush or chew this medicine. You can take this medicine with or without food. Take your doses at regular intervals. Do not take your medicine more often than directed. Talk to your pediatrician regarding the use of this medicine in children. While this drug may be prescribed for children as young  as 29 years old for selected conditions, precautions do apply. Overdosage: If you think you have taken too much of this medicine contact a poison control center or emergency room at once. NOTE: This medicine is only for you. Do not share this medicine with others. What if I miss a dose? If you miss a dose, take it as soon as you can. If your next dose is to be taken in less than 12 hours, then do not take the missed dose. Take the next dose at your regular time. Do not take double or extra doses. What may interact with this medicine? Do not take this medicine with any of the following medications:  herbal medicines like red yeast rice This medicine may also interact with the following medications:  alcohol  antacids containing aluminum hydroxide or magnesium hydroxide  cyclosporine  other medicines for high cholesterol  some medicines for HIV infection  warfarin This list may not describe all possible interactions. Give your health care provider a list of all the medicines, herbs, non-prescription drugs, or dietary supplements you use. Also tell them if you smoke, drink alcohol, or use illegal drugs. Some items may interact with your medicine. What should I watch for while using this medicine? Visit your doctor or health care professional for regular check-ups. You may need regular tests to make sure your liver is working properly. Your health care professional may tell you to stop taking this medicine if you develop muscle  problems. If your muscle problems do not go away after stopping this medicine, contact your health care professional. Do not become pregnant while taking this medicine. Women should inform their health care professional if they wish to become pregnant or think they might be pregnant. There is a potential for serious side effects to an unborn child. Talk to your health care professional or pharmacist for more information. Do not breast-feed an infant while taking this  medicine. This medicine may increase blood sugar. Ask your healthcare provider if changes in diet or medicines are needed if you have diabetes. If you are going to need surgery or other procedure, tell your doctor that you are using this medicine. This drug is only part of a total heart-health program. Your doctor or a dietician can suggest a low-cholesterol and low-fat diet to help. Avoid alcohol and smoking, and keep a proper exercise schedule. This medicine may cause a decrease in Co-Enzyme Q-10. You should make sure that you get enough Co-Enzyme Q-10 while you are taking this medicine. Discuss the foods you eat and the vitamins you take with your health care professional. What side effects may I notice from receiving this medicine? Side effects that you should report to your doctor or health care professional as soon as possible:  allergic reactions like skin rash, itching or hives, swelling of the face, lips, or tongue  confusion  joint pain  loss of memory  redness, blistering, peeling or loosening of the skin, including inside the mouth  signs and symptoms of high blood sugar such as being more thirsty or hungry or having to urinate more than normal. You may also feel very tired or have blurry vision.  signs and symptoms of muscle injury like dark urine; trouble passing urine or change in the amount of urine; unusually weak or tired; muscle pain or side or back pain  yellowing of the eyes or skin Side effects that usually do not require medical attention (report to your doctor or health care professional if they continue or are bothersome):  constipation  diarrhea  dizziness  gas  headache  nausea  stomach pain  trouble sleeping  upset stomach This list may not describe all possible side effects. Call your doctor for medical advice about side effects. You may report side effects to FDA at 1-800-FDA-1088. Where should I keep my medicine? Keep out of the reach of  children. Store at room temperature between 20 and 25 degrees C (68 and 77 degrees F). Keep container tightly closed (protect from moisture). Throw away any unused medicine after the expiration date. NOTE: This sheet is a summary. It may not cover all possible information. If you have questions about this medicine, talk to your doctor, pharmacist, or health care provider.  2020 Elsevier/Gold Standard (2018-06-10 08:25:08)

## 2020-07-07 LAB — CBC WITH DIFFERENTIAL/PLATELET
Absolute Monocytes: 807 cells/uL (ref 200–950)
Basophils Absolute: 30 cells/uL (ref 0–200)
Basophils Relative: 0.4 %
Eosinophils Absolute: 59 cells/uL (ref 15–500)
Eosinophils Relative: 0.8 %
HCT: 47.8 % (ref 38.5–50.0)
Hemoglobin: 16.3 g/dL (ref 13.2–17.1)
Lymphs Abs: 1421 cells/uL (ref 850–3900)
MCH: 31.8 pg (ref 27.0–33.0)
MCHC: 34.1 g/dL (ref 32.0–36.0)
MCV: 93.2 fL (ref 80.0–100.0)
MPV: 10.7 fL (ref 7.5–12.5)
Monocytes Relative: 10.9 %
Neutro Abs: 5084 cells/uL (ref 1500–7800)
Neutrophils Relative %: 68.7 %
Platelets: 209 10*3/uL (ref 140–400)
RBC: 5.13 10*6/uL (ref 4.20–5.80)
RDW: 11.9 % (ref 11.0–15.0)
Total Lymphocyte: 19.2 %
WBC: 7.4 10*3/uL (ref 3.8–10.8)

## 2020-07-07 LAB — COMPLETE METABOLIC PANEL WITH GFR
AG Ratio: 1.6 (calc) (ref 1.0–2.5)
ALT: 16 U/L (ref 9–46)
AST: 15 U/L (ref 10–35)
Albumin: 4.4 g/dL (ref 3.6–5.1)
Alkaline phosphatase (APISO): 77 U/L (ref 35–144)
BUN: 19 mg/dL (ref 7–25)
CO2: 31 mmol/L (ref 20–32)
Calcium: 10 mg/dL (ref 8.6–10.3)
Chloride: 98 mmol/L (ref 98–110)
Creat: 0.99 mg/dL (ref 0.70–1.25)
GFR, Est African American: 90 mL/min/{1.73_m2} (ref >=60)
GFR, Est Non African American: 78 mL/min/{1.73_m2} (ref >=60)
Globulin: 2.8 g/dL (calc) (ref 1.9–3.7)
Glucose, Bld: 100 mg/dL — ABNORMAL HIGH (ref 65–99)
Potassium: 5.1 mmol/L (ref 3.5–5.3)
Sodium: 136 mmol/L (ref 135–146)
Total Bilirubin: 0.6 mg/dL (ref 0.2–1.2)
Total Protein: 7.2 g/dL (ref 6.1–8.1)

## 2020-07-07 LAB — LIPID PANEL
Cholesterol: 175 mg/dL (ref <200)
HDL: 47 mg/dL (ref >=40)
LDL Cholesterol (Calc): 109 mg/dL (calc) — ABNORMAL HIGH
Non-HDL Cholesterol (Calc): 128 mg/dL (calc) (ref <130)
Total CHOL/HDL Ratio: 3.7 (calc) (ref <5.0)
Triglycerides: 97 mg/dL (ref <150)

## 2020-07-07 LAB — TSH: TSH: 1.78 mIU/L (ref 0.40–4.50)

## 2020-07-07 LAB — MAGNESIUM: Magnesium: 2.2 mg/dL (ref 1.5–2.5)

## 2020-07-11 ENCOUNTER — Telehealth (HOSPITAL_COMMUNITY): Payer: Self-pay

## 2020-07-11 NOTE — Telephone Encounter (Signed)
Pt insurance is active and benefits verified through Medicare a/b Co-pay 0, DED $203/$203 met, out of pocket 0/0 met, co-insurance 20%. no pre-authorization required.  2ndary insurance is active and benefits verified through [employer]. Co-pay 0, DED 0/0 met, out of pocket 0/0 met, co-insurance 0. No pre-authorization required.  

## 2020-07-11 NOTE — Telephone Encounter (Signed)
Patient called back and is interested in participating in the Pulmonary Rehab Program. Patient stated yes. Patient will come in for orientation on 08/27/2020@10 :30am and will attend the 10:00am exercise class.  Mailed homework package.

## 2020-07-23 ENCOUNTER — Other Ambulatory Visit: Payer: Self-pay | Admitting: Internal Medicine

## 2020-07-23 MED ORDER — DEXAMETHASONE 4 MG PO TABS: ORAL_TABLET | ORAL | 0 refills | Status: DC

## 2020-07-23 MED ORDER — AZITHROMYCIN 250 MG PO TABS: ORAL_TABLET | ORAL | 0 refills | Status: DC

## 2020-08-21 ENCOUNTER — Telehealth (HOSPITAL_COMMUNITY): Payer: Self-pay | Admitting: *Deleted

## 2020-08-27 ENCOUNTER — Encounter (HOSPITAL_COMMUNITY): Payer: Self-pay

## 2020-08-27 ENCOUNTER — Encounter (HOSPITAL_COMMUNITY)
Admission: RE | Admit: 2020-08-27 | Discharge: 2020-08-27 | Disposition: A | Discharge status: Home / Self Care | Payer: Medicare Other | Source: Ambulatory Visit | Attending: Pulmonary Disease | Admitting: Pulmonary Disease

## 2020-08-27 ENCOUNTER — Other Ambulatory Visit: Payer: Self-pay

## 2020-08-27 VITALS — BP 98/60 | HR 82 | Ht 64.5 in | Wt 152.6 lb

## 2020-08-27 DIAGNOSIS — J9611 Chronic respiratory failure with hypoxia: Secondary | ICD-10-CM | POA: Diagnosis not present

## 2020-08-27 HISTORY — DX: Hyperlipidemia, unspecified: E78.5

## 2020-08-27 NOTE — Progress Notes (Signed)
Pulmonary Individual Treatment Plan  Patient Details  Name: Douglas Barrera MRN: [id number] Date of Birth: 1950/11/27 Referring Provider:   [name] Pulmonary Rehab Walk Test from 08/27/2020 in [place]  Referring Provider [name], DO      Initial Encounter Date:  Flowsheet Row Pulmonary Rehab Walk Test from 08/27/2020 in [place]  Date 08/27/20      Visit Diagnosis: Chronic hypoxemic respiratory failure ([place])  Patient's Home Medications on Admission:   Current Outpatient Medications:  .  albuterol (VENTOLIN HFA) 108 (90 Base) MCG/ACT inhaler, Inhale 1 to 2 puffs 4 x day or every 4 hours to rescue asthma, Disp: 18 g, Rfl: 1 .  aspirin 81 MG tablet, Take 81 mg by mouth every evening. , Disp: , Rfl:  .  budesonide (PULMICORT) 0.5 MG/2ML nebulizer solution, Take 2 mLs (0.5 mg total) by nebulization 2 (two) times daily., Disp: 120 mL, Rfl: 11 .  Cholecalciferol (VITAMIN D3) 5000 units CAPS, Take 10,000 Units by mouth daily. , Disp: , Rfl:  .  dexamethasone (DECADRON) 4 MG tablet, Take 1 tab 3 x day for 2  days, then 2 x day for 2 days, then 1 tab daily, Disp: 13 tablet, Rfl: 0 .  diphenhydrAMINE (BENADRYL) 25 MG tablet, Take 50 mg by mouth daily. , Disp: , Rfl:  .  fluticasone (FLONASE) 50 MCG/ACT nasal spray, Place 2 sprays into both nostrils daily. (Patient taking differently: Place 2 sprays into both nostrils every evening.), Disp: 48 g, Rfl: 3 .  formoterol (PERFOROMIST) 20 MCG/2ML nebulizer solution, Use 2 mls by Inhalation 2 x /day, Disp: 360 mL, Rfl: 2 .  guaifenesin (HUMIBID E) 400 MG TABS tablet, Take 800 mg by mouth daily. , Disp: , Rfl:  .  ipratropium (ATROVENT) 0.06 % nasal spray, Use 1 to 2 sprays each nostril 2 to 3 x /day as needed, Disp: 45 mL, Rfl: 3 .  revefenacin (YUPELRI) 175 MCG/3ML nebulizer solution, Take 3 mLs (175 mcg total) by nebulization daily., Disp: 90 mL, Rfl: 11 .  rosuvastatin  (CRESTOR) 5 MG tablet, Take 1 tab three days a week (WMF) in the evening for cholesterol., Disp: 38 tablet, Rfl: 3 .  traZODone (DESYREL) 150 MG tablet, TAKE 1 TABLET 1 HOUR BEFORE BEDTIME AS NEEDED FOR SLEEP, Disp: 90 tablet, Rfl: 3 .  trolamine salicylate (ASPERCREME) 10 % cream, Apply 1 application topically as needed for muscle pain., Disp: , Rfl:  .  zinc gluconate 50 MG tablet, Take 50 mg by mouth daily., Disp: , Rfl:  .  azithromycin (ZITHROMAX) 250 MG tablet, Take 2 tablets with Food on  Day 1, then 1 tablet Daily with Food for Infection (Patient not taking: Reported on 08/27/2020), Disp: 6 each, Rfl: 0 .  b complex vitamins tablet, Take 1 tablet by mouth every evening.  (Patient not taking: Reported on 08/27/2020), Disp: , Rfl:   Past Medical History: Past Medical History:  Diagnosis Date  . COPD (chronic obstructive pulmonary disease) ([place])   . GERD (gastroesophageal reflux disease)   . History of elevated lipids   . Hyperlipidemia   . IBS (irritable bowel syndrome)   . Labile hypertension    patient is not on meds for hypertension  . Prediabetes   . Secondary polycythemia 05/25/2018  . Tubular adenoma of colon   . Vitamin D deficiency     Tobacco Use: Social History   Tobacco Use  Smoking Status Former  Smoker  . Packs/day: 2.00  . Years: 49.00  . Pack years: 98.00  . Types: Cigars, Cigarettes  . Start date: 10/17/1968  . Quit date: 08/18/2018  . Years since quitting: 2.0  Smokeless Tobacco Never Used  Tobacco Comment   quit smoking cigarettes in Dec 2019 but do backslide occasional, but smokes 3-4 cigars a week. small sigar on occasion 10/13/18    Labs: Recent Review Flowsheet Data    Labs for ITP Cardiac and Pulmonary Rehab Latest Ref Rng & Units 06/29/2019 09/29/2019 12/29/2019 04/03/2020 07/06/2020   Cholestrol <200 mg/dL 158 166 176 173 175   LDLCALC mg/dL (calc) 95 98 107(H) 106(H) 109(H)   HDL > OR = 40 mg/dL 44 44 43 46 47   Trlycerides <150 mg/dL 96 142 150(H)  114 97   Hemoglobin A1c <5.7 % of total Hgb - 5.8(H) - 6.0(H) -   PHART 7.350 - 7.450 - - - - -   PCO2ART 32.0 - 48.0 mmHg - - - - -   HCO3 20.0 - 28.0 mmol/L - - - - -   O2SAT % - - - - -      Capillary Blood Glucose: No results found for: GLUCAP   Pulmonary Assessment Scores:  Pulmonary Assessment Scores    Row Name 08/27/20 1118         ADL UCSD   ADL Phase Entry     SOB Score total 37           CAT Score   CAT Score 16           mMRC Score   mMRC Score 1           UCSD: Self-administered rating of dyspnea associated with activities of daily living (ADLs) 6-point scale (0 = "not at all" to 5 = "maximal or unable to do because of breathlessness")  Scoring Scores range from 0 to 120.  Minimally important difference is 5 units  CAT: CAT can identify the health impairment of COPD patients and is better correlated with disease progression.  CAT has a scoring range of zero to 40. The CAT score is classified into four groups of low (less than 10), medium (10 - 20), high (21-30) and very high (31-40) based on the impact level of disease on health status. A CAT score over 10 suggests significant symptoms.  A worsening CAT score could be explained by an exacerbation, poor medication adherence, poor inhaler technique, or progression of COPD or comorbid conditions.  CAT MCID is 2 points  mMRC: mMRC (Modified Medical Research Council) Dyspnea Scale is used to assess the degree of baseline functional disability in patients of respiratory disease due to dyspnea. No minimal important difference is established. A decrease in score of 1 point or greater is considered a positive change.   Pulmonary Function Assessment:  Pulmonary Function Assessment - 08/27/20 1105      Breath   Bilateral Breath Sounds Clear;Decreased    Shortness of Breath No           Exercise Target Goals: Exercise Program Goal: Individual exercise prescription set using results from initial 6 min walk  test and THRR while considering  patient's activity barriers and safety.   Exercise Prescription Goal: Initial exercise prescription builds to 30-45 minutes a day of aerobic activity, 2-3 days per week.  Home exercise guidelines will be given to patient during program as part of exercise prescription that the participant will acknowledge.  Activity Barriers & Risk  Stratification:  Activity Barriers & Cardiac Risk Stratification - 08/27/20 1055      Activity Barriers & Cardiac Risk Stratification   Activity Barriers Deconditioning;Muscular Weakness;Shortness of Breath           6 Minute Walk:  6 Minute Walk    Row Name 08/27/20 1122         6 Minute Walk   Phase Initial     Distance 1140 feet     Walk Time 6 minutes     # of Rest Breaks 0     METS 2.16     RPE 11     Perceived Dyspnea  1     VO2 Peak 9.48     Symptoms Yes (comment)     Comments SOB, RPD = 1. No pain or other complaints noted.     Resting HR 68 bpm     Resting BP 98/60     Resting Oxygen Saturation  98 %     Exercise Oxygen Saturation  during 6 min walk 94 %     Max Ex. HR 107 bpm     Max Ex. BP 114/60     2 Minute Post BP 110/64           Interval HR   1 Minute HR 99     2 Minute HR 104     3 Minute HR 107     4 Minute HR 105     5 Minute HR 107     6 Minute HR 107     2 Minute Post HR 81     Interval Heart Rate? Yes           Interval Oxygen   Interval Oxygen? Yes     1 Minute Oxygen Saturation % 98 %     1 Minute Liters of Oxygen 3 L     2 Minute Oxygen Saturation % 96 %     2 Minute Liters of Oxygen 3 L     3 Minute Oxygen Saturation % 95 %     3 Minute Liters of Oxygen 3 L     4 Minute Oxygen Saturation % 94 %     4 Minute Liters of Oxygen 3 L     5 Minute Oxygen Saturation % 94 %     6 Minute Oxygen Saturation % 94 %     6 Minute Liters of Oxygen 3 L     2 Minute Post Oxygen Saturation % 97 %     2 Minute Post Liters of Oxygen 3 L            Oxygen Initial Assessment:   Oxygen Initial Assessment - 08/27/20 1103      Home Oxygen   Home Oxygen Device E-Tanks;Home Concentrator    Sleep Oxygen Prescription Continuous    Liters per minute 2.5    Home Exercise Oxygen Prescription Pulsed    Liters per minute 3    Home Resting Oxygen Prescription Continuous    Liters per minute 2.5    Compliance with Home Oxygen Use Yes      Initial 6 min Walk   Oxygen Used Continuous    Liters per minute 3      Program Oxygen Prescription   Program Oxygen Prescription Continuous    Liters per minute 3      Intervention   Short Term Goals To learn and exhibit compliance with exercise, home and travel O2 prescription;To learn and  understand importance of monitoring SPO2 with pulse oximeter and demonstrate accurate use of the pulse oximeter.;To learn and understand importance of maintaining oxygen saturations>88%;To learn and demonstrate proper pursed lip breathing techniques or other breathing techniques.;To learn and demonstrate proper use of respiratory medications    Long  Term Goals Exhibits compliance with exercise, home and travel O2 prescription;Verbalizes importance of monitoring SPO2 with pulse oximeter and return demonstration;Maintenance of O2 saturations>88%;Exhibits proper breathing techniques, such as pursed lip breathing or other method taught during program session;Demonstrates proper use of MDI's;Compliance with respiratory medication           Oxygen Re-Evaluation:   Oxygen Discharge (Final Oxygen Re-Evaluation):   Initial Exercise Prescription:  Initial Exercise Prescription - 08/27/20 1200      Date of Initial Exercise RX and Referring Provider   Date 08/27/20    Referring Provider [name], DO    Expected Discharge Date 11/01/20      Oxygen   Oxygen Continuous    Liters 3      NuStep   Level 2    SPM 75    Minutes 15    METs 2      Track   Laps 10    Minutes 15    METs 2.16      Prescription Details   Frequency (times  per week) 2    Duration Progress to 30 minutes of continuous aerobic without signs/symptoms of physical distress      Intensity   THRR 40-80% of Max Heartrate 61.122    Ratings of Perceived Exertion 11-13    Perceived Dyspnea 0-4      Progression   Progression Continue progressive overload as per policy without signs/symptoms or physical distress.      Resistance Training   Training Prescription Yes    Weight Orange Bands    Reps 10-15           Perform Capillary Blood Glucose checks as needed.  Exercise Prescription Changes:   Exercise Comments:   Exercise Goals and Review:  Exercise Goals    Row Name 08/27/20 1207             Exercise Goals   Increase Physical Activity Yes       Intervention Provide advice, education, support and counseling about physical activity/exercise needs.;Develop an individualized exercise prescription for aerobic and resistive training based on initial evaluation findings, risk stratification, comorbidities and participant's personal goals.       Expected Outcomes Short Term: Attend rehab on a regular basis to increase amount of physical activity.;Long Term: Add in home exercise to make exercise part of routine and to increase amount of physical activity.       Increase Strength and Stamina Yes       Intervention Provide advice, education, support and counseling about physical activity/exercise needs.;Develop an individualized exercise prescription for aerobic and resistive training based on initial evaluation findings, risk stratification, comorbidities and participant's personal goals.       Expected Outcomes Short Term: Increase workloads from initial exercise prescription for resistance, speed, and METs.;Short Term: Perform resistance training exercises routinely during rehab and add in resistance training at home;Long Term: Improve cardiorespiratory fitness, muscular endurance and strength as measured by increased METs and functional capacity  (6MWT)       Able to understand and use rate of perceived exertion (RPE) scale Yes       Intervention Provide education and explanation on how to use RPE scale  Expected Outcomes Short Term: Able to use RPE daily in rehab to express subjective intensity level;Long Term:  Able to use RPE to guide intensity level when exercising independently       Able to understand and use Dyspnea scale Yes       Intervention Provide education and explanation on how to use Dyspnea scale       Expected Outcomes Short Term: Able to use Dyspnea scale daily in rehab to express subjective sense of shortness of breath during exertion;Long Term: Able to use Dyspnea scale to guide intensity level when exercising independently       Knowledge and understanding of Target Heart Rate Range (THRR) Yes       Intervention Provide education and explanation of THRR including how the numbers were predicted and where they are located for reference       Expected Outcomes Short Term: Able to state/look up THRR;Short Term: Able to use daily as guideline for intensity in rehab;Long Term: Able to use THRR to govern intensity when exercising independently       Understanding of Exercise Prescription Yes       Intervention Provide education, explanation, and written materials on patient's individual exercise prescription       Expected Outcomes Short Term: Able to explain program exercise prescription;Long Term: Able to explain home exercise prescription to exercise independently              Exercise Goals Re-Evaluation :   Discharge Exercise Prescription (Final Exercise Prescription Changes):   Nutrition:  Target Goals: Understanding of nutrition guidelines, daily intake of sodium 1500mg , cholesterol 200mg , calories 30% from fat and 7% or less from saturated fats, daily to have 5 or more servings of fruits and vegetables.  Biometrics:  Pre Biometrics - 08/27/20 1100      Pre Biometrics   Grip Strength 29 kg             Nutrition Therapy Plan and Nutrition Goals:   Nutrition Assessments:  MEDIFICTS Score Key:  ?70 Need to make dietary changes   40-70 Heart Healthy Diet  ? 40 Therapeutic Level Cholesterol Diet   Picture Your Plate Scores:  [id number] Unhealthy dietary pattern with much room for improvement.  41-50 Dietary pattern unlikely to meet recommendations for good health and room for improvement.  51-60 More healthful dietary pattern, with some room for improvement.   >60 Healthy dietary pattern, although there may be some specific behaviors that could be improved.    Nutrition Goals Re-Evaluation:   Nutrition Goals Discharge (Final Nutrition Goals Re-Evaluation):   Psychosocial: Target Goals: Acknowledge presence or absence of significant depression and/or stress, maximize coping skills, provide positive support system. Participant is able to verbalize types and ability to use techniques and skills needed for reducing stress and depression.  Initial Review & Psychosocial Screening:  Initial Psych Review & Screening - 08/27/20 1106      Initial Review   Current issues with None Identified      Family Dynamics   Good Support System? Yes   wife supportive     Barriers   Psychosocial barriers to participate in program There are no identifiable barriers or psychosocial needs.      Screening Interventions   Interventions Encouraged to exercise           Quality of Life Scores:  Scores of 19 and below usually indicate a poorer quality of life in these areas.  A difference of  2-3 points is a  clinically meaningful difference.  A difference of 2-3 points in the total score of the Quality of Life Index has been associated with significant improvement in overall quality of life, self-image, physical symptoms, and general health in studies assessing change in quality of life.  PHQ-9: Recent Review Flowsheet Data    Depression screen [facility] 2/9 08/27/2020 04/02/2020 12/29/2019  10/01/2019 03/26/2019   Decreased Interest 0 0 0 0 0   Down, Depressed, Hopeless 0 0 0 0 0   PHQ - 2 Score 0 0 0 0 0   Altered sleeping 0 - - - -   Tired, decreased energy 0 - - - -   Change in appetite 0 - - - -   Feeling bad or failure about yourself  0 - - - -   Trouble concentrating 0 - - - -   Moving slowly or fidgety/restless 0 - - - -   Suicidal thoughts 0 - - - -   Difficult doing work/chores Not difficult at all - - - -     Interpretation of Total Score  Total Score Depression Severity:  1-4 = Minimal depression, 5-9 = Mild depression, 10-14 = Moderate depression, 15-19 = Moderately severe depression, 20-27 = Severe depression   Psychosocial Evaluation and Intervention:  Psychosocial Evaluation - 08/27/20 1107      Psychosocial Evaluation & Interventions   Interventions Encouraged to exercise with the program and follow exercise prescription    Comments No concerns identified    Expected Outcomes for [place] to continue to be free of psychosocial concerns while in pulmonary rehab.    Continue Psychosocial Services  No Follow up required           Psychosocial Re-Evaluation:   Psychosocial Discharge (Final Psychosocial Re-Evaluation):   Education: Education Goals: Education classes will be provided on a weekly basis, covering required topics. Participant will state understanding/return demonstration of topics presented.  Learning Barriers/Preferences:  Learning Barriers/Preferences - 08/27/20 1108      Learning Barriers/Preferences   Learning Barriers None    Learning Preferences Computer/Internet           Education Topics: Risk Factor Reduction:  -Group instruction that is supported by a PowerPoint presentation. Instructor discusses the definition of a risk factor, different risk factors for pulmonary disease, and how the heart and lungs work together.     Nutrition for Pulmonary Patient:  -Group instruction provided by PowerPoint slides, verbal  discussion, and written materials to support subject matter. The instructor gives an explanation and review of healthy diet recommendations, which includes a discussion on weight management, recommendations for fruit and vegetable consumption, as well as protein, fluid, caffeine, fiber, sodium, sugar, and alcohol. Tips for eating when patients are short of breath are discussed.   Pursed Lip Breathing:  -Group instruction that is supported by demonstration and informational handouts. Instructor discusses the benefits of pursed lip and diaphragmatic breathing and detailed demonstration on how to preform both.     Oxygen Safety:  -Group instruction provided by PowerPoint, verbal discussion, and written material to support subject matter. There is an overview of "What is Oxygen" and "Why do we need it".  Instructor also reviews how to create a safe environment for oxygen use, the importance of using oxygen as prescribed, and the risks of noncompliance. There is a brief discussion on traveling with oxygen and resources the patient may utilize.   Oxygen Equipment:  -Group instruction provided by [facility] Staff utilizing handouts, written materials, and equipment  demonstrations.   Signs and Symptoms:  -Group instruction provided by written material and verbal discussion to support subject matter. Warning signs and symptoms of infection, stroke, and heart attack are reviewed and when to call the physician/911 reinforced. Tips for preventing the spread of infection discussed.   Advanced Directives:  -Group instruction provided by verbal instruction and written material to support subject matter. Instructor reviews Advanced Directive laws and proper instruction for filling out document.   Pulmonary Video:  -Group video education that reviews the importance of medication and oxygen compliance, exercise, good nutrition, pulmonary hygiene, and pursed lip and diaphragmatic breathing for the pulmonary  patient.   Exercise for the Pulmonary Patient:  -Group instruction that is supported by a PowerPoint presentation. Instructor discusses benefits of exercise, core components of exercise, frequency, duration, and intensity of an exercise routine, importance of utilizing pulse oximetry during exercise, safety while exercising, and options of places to exercise outside of rehab.     Pulmonary Medications:  -Verbally interactive group education provided by instructor with focus on inhaled medications and proper administration.   Anatomy and Physiology of the Respiratory System and Intimacy:  -Group instruction provided by PowerPoint, verbal discussion, and written material to support subject matter. Instructor reviews respiratory cycle and anatomical components of the respiratory system and their functions. Instructor also reviews differences in obstructive and restrictive respiratory diseases with examples of each. Intimacy, Sex, and Sexuality differences are reviewed with a discussion on how relationships can change when diagnosed with pulmonary disease. Common sexual concerns are reviewed.   MD DAY -A group question and answer session with a medical doctor that allows participants to ask questions that relate to their pulmonary disease state.   OTHER EDUCATION -Group or individual verbal, written, or video instructions that support the educational goals of the pulmonary rehab program.   Holiday Eating Survival Tips:  -Group instruction provided by PowerPoint slides, verbal discussion, and written materials to support subject matter. The instructor gives patients tips, tricks, and techniques to help them not only survive but enjoy the holidays despite the onslaught of food that accompanies the holidays.   Knowledge Questionnaire Score:  Knowledge Questionnaire Score - 08/27/20 1218      Knowledge Questionnaire Score   Pre Score 16/18           Core Components/Risk Factors/Patient  Goals at Admission:  Personal Goals and Risk Factors at Admission - 08/27/20 1119      Core Components/Risk Factors/Patient Goals on Admission   Tobacco Cessation Yes    Number of packs per day --   1-2 cigars occassionally   Intervention Assist the participant in steps to quit. Provide individualized education and counseling about committing to Tobacco Cessation, relapse prevention, and pharmacological support that can be provided by physician.;Advice worker, assist with locating and accessing local/national Quit Smoking programs, and support quit date choice.    Expected Outcomes Short Term: Will demonstrate readiness to quit, by selecting a quit date.;Short Term: Will quit all tobacco product use, adhering to prevention of relapse plan.;Long Term: Complete abstinence from all tobacco products for at least 12 months from quit date.    Improve shortness of breath with ADL's Yes    Intervention Provide education, individualized exercise plan and daily activity instruction to help decrease symptoms of SOB with activities of daily living.    Expected Outcomes Short Term: Improve cardiorespiratory fitness to achieve a reduction of symptoms when performing ADLs;Long Term: Be able to perform more ADLs without symptoms or  delay the onset of symptoms           Core Components/Risk Factors/Patient Goals Review:    Core Components/Risk Factors/Patient Goals at Discharge (Final Review):    ITP Comments:   Comments:

## 2020-08-27 NOTE — Progress Notes (Signed)
Douglas Barrera 69 y.o. male Pulmonary Rehab Orientation Note This patient who was referred to Pulmonary rehab by Dr. Valeta Barrera with the diagnosis of chronic hypoxemic respiratory failure arrived today in Cardiac and Pulmonary Rehab. He arrived ambulatory with normal gait. He does carry portable oxygen. Douglas Barrera is the provider for his DME. Per pt, he uses oxygen continuously. Color good, skin warm and dry. Patient is oriented to time and place. Patient's medical history, psychosocial health, and medications reviewed. Psychosocial assessment reveals pt lives with their spouse. Pt is currently retired. Pt hobbies include cooking, reading, playing bridge. Pt reports his stress level is low. Patient does not exhibit signs of depression. PHQ2/9 score 0/0. Pt shows good  coping skills with positive outlook . Will continue to monitor and evaluate progress toward psychosocial goal(s) of continued mental well being while participating in pulmonary rehab. Physical assessment reveals heart rate is normal, breath sounds clear to auscultation, no wheezes, rales, or rhonchi, diminished. Grip strength equal, strong, no peripheral edema. Patient reports he does take medications as prescribed. Patient states he follows a Regular diet. The patient reports no specific efforts to gain or lose weight.. Patient's weight will be monitored closely. Demonstration and practice of PLB using pulse oximeter. Patient able to return demonstration satisfactorily. Safety and hand hygiene in the exercise area reviewed with patient. Patient voices understanding of the information reviewed. Department expectations discussed with patient and achievable goals were set. The patient shows enthusiasm about attending the program and we look forward to working with this nice gentleman. The patient completed a 6 min walk test today, 08/27/2020 and to begin exercise on Tuesday, September 04, 2020 in the 1000 exercise slot.  [id number]

## 2020-09-04 ENCOUNTER — Other Ambulatory Visit: Payer: Self-pay

## 2020-09-04 ENCOUNTER — Encounter (HOSPITAL_COMMUNITY)
Admission: RE | Admit: 2020-09-04 | Discharge: 2020-09-04 | Disposition: A | Discharge status: Home / Self Care | Payer: Medicare Other | Source: Ambulatory Visit | Attending: Pulmonary Disease | Admitting: Pulmonary Disease

## 2020-09-04 DIAGNOSIS — J9611 Chronic respiratory failure with hypoxia: Secondary | ICD-10-CM | POA: Diagnosis not present

## 2020-09-04 NOTE — Progress Notes (Signed)
Daily Session Note  Patient Details  Name: Douglas Barrera MRN: [id number] Date of Birth: February 18, 1951 Referring Provider:   [name] Pulmonary Rehab Walk Test from 08/27/2020 in [place]  Referring Provider [name], DO      Encounter Date: 09/04/2020  Check In:  Session Check In - 09/04/20 1021      Check-In   Supervising physician immediately available to respond to emergencies [facility] immediately available    Physician(s) Dr. Florene Glen    Location [facility]    Staff Present Rosebud Poles, RN, BSN;Lisa Ysidro Evert, RN;Zayon Trulson Hassell Done, MS, ACSM-CEP, Exercise Physiologist    Virtual Visit No    Medication changes reported     No    Fall or balance concerns reported    No    Tobacco Cessation No Change    Warm-up and Cool-down Performed on first and last piece of equipment    Resistance Training Performed Yes    VAD Patient? No    PAD/SET Patient? No      Pain Assessment   Currently in Pain? No/denies    Multiple Pain Sites No           Capillary Blood Glucose: No results found for this or any previous visit (from the past 24 hour(s)).    Social History   Tobacco Use  Smoking Status Former Smoker  . Packs/day: 2.00  . Years: 49.00  . Pack years: 98.00  . Types: Cigars, Cigarettes  . Start date: 10/17/1968  . Quit date: 08/18/2018  . Years since quitting: 2.0  Smokeless Tobacco Never Used  Tobacco Comment   quit smoking cigarettes in Dec 2019 but do backslide occasional, but smokes 3-4 cigars a week. small sigar on occasion 10/13/18    Goals Met:  Proper associated with RPD/PD & O2 Sat Exercise tolerated well No report of cardiac concerns or symptoms Strength training completed today  Goals Unmet:  Not Applicable  Comments: Service time is from 0955 to 1114    Dr. Fransico Him is Medical Director for Cardiac Rehab at [facility].

## 2020-09-06 ENCOUNTER — Other Ambulatory Visit: Payer: Self-pay

## 2020-09-06 ENCOUNTER — Encounter (HOSPITAL_COMMUNITY)
Admission: RE | Admit: 2020-09-06 | Discharge: 2020-09-06 | Disposition: A | Discharge status: Home / Self Care | Payer: Medicare Other | Source: Ambulatory Visit | Attending: Pulmonary Disease | Admitting: Pulmonary Disease

## 2020-09-06 DIAGNOSIS — J9611 Chronic respiratory failure with hypoxia: Secondary | ICD-10-CM

## 2020-09-06 NOTE — Progress Notes (Signed)
Douglas Barrera 70 y.o. male Nutrition Note  Visit Diagnosis: Chronic hypoxemic respiratory failure [facility])  Past Medical History:  Diagnosis Date  . COPD (chronic obstructive pulmonary disease) ([place])   . GERD (gastroesophageal reflux disease)   . History of elevated lipids   . Hyperlipidemia   . IBS (irritable bowel syndrome)   . Labile hypertension    patient is not on meds for hypertension  . Prediabetes   . Secondary polycythemia 05/25/2018  . Tubular adenoma of colon   . Vitamin D deficiency      Medications reviewed.   Current Outpatient Medications:  .  albuterol (VENTOLIN HFA) 108 (90 Base) MCG/ACT inhaler, Inhale 1 to 2 puffs 4 x day or every 4 hours to rescue asthma, Disp: 18 g, Rfl: 1 .  aspirin 81 MG tablet, Take 81 mg by mouth every evening. , Disp: , Rfl:  .  azithromycin (ZITHROMAX) 250 MG tablet, Take 2 tablets with Food on  Day 1, then 1 tablet Daily with Food for Infection (Patient not taking: Reported on 08/27/2020), Disp: 6 each, Rfl: 0 .  b complex vitamins tablet, Take 1 tablet by mouth every evening.  (Patient not taking: Reported on 08/27/2020), Disp: , Rfl:  .  budesonide (PULMICORT) 0.5 MG/2ML nebulizer solution, Take 2 mLs (0.5 mg total) by nebulization 2 (two) times daily., Disp: 120 mL, Rfl: 11 .  Cholecalciferol (VITAMIN D3) 5000 units CAPS, Take 10,000 Units by mouth daily. , Disp: , Rfl:  .  dexamethasone (DECADRON) 4 MG tablet, Take 1 tab 3 x day for 2  days, then 2 x day for 2 days, then 1 tab daily, Disp: 13 tablet, Rfl: 0 .  diphenhydrAMINE (BENADRYL) 25 MG tablet, Take 50 mg by mouth daily. , Disp: , Rfl:  .  fluticasone (FLONASE) 50 MCG/ACT nasal spray, Place 2 sprays into both nostrils daily. (Patient taking differently: Place 2 sprays into both nostrils every evening.), Disp: 48 g, Rfl: 3 .  formoterol (PERFOROMIST) 20 MCG/2ML nebulizer solution, Use 2 mls by Inhalation 2 x /day, Disp: 360 mL, Rfl: 2 .  guaifenesin (HUMIBID E) 400 MG TABS tablet,  Take 800 mg by mouth daily. , Disp: , Rfl:  .  ipratropium (ATROVENT) 0.06 % nasal spray, Use 1 to 2 sprays each nostril 2 to 3 x /day as needed, Disp: 45 mL, Rfl: 3 .  revefenacin (YUPELRI) 175 MCG/3ML nebulizer solution, Take 3 mLs (175 mcg total) by nebulization daily., Disp: 90 mL, Rfl: 11 .  rosuvastatin (CRESTOR) 5 MG tablet, Take 1 tab three days a week (WMF) in the evening for cholesterol., Disp: 38 tablet, Rfl: 3 .  traZODone (DESYREL) 150 MG tablet, TAKE 1 TABLET 1 HOUR BEFORE BEDTIME AS NEEDED FOR SLEEP, Disp: 90 tablet, Rfl: 3 .  trolamine salicylate (ASPERCREME) 10 % cream, Apply 1 application topically as needed for muscle pain., Disp: , Rfl:  .  zinc gluconate 50 MG tablet, Take 50 mg by mouth daily., Disp: , Rfl:    Ht Readings from Last 1 Encounters:  08/27/20 5' 4.5" (1.638 m)     Wt Readings from Last 3 Encounters:  08/27/20 152 lb 8.9 oz (69.2 kg)  07/06/20 152 lb (68.9 kg)  06/13/20 152 lb 6.4 oz (69.1 kg)      Social History   Tobacco Use  Smoking Status Former Smoker  . Packs/day: 2.00  . Years: 49.00  . Pack years: 98.00  . Types: Cigars, Cigarettes  . Start date: 10/17/1968  .  Quit date: 08/18/2018  . Years since quitting: 2.0  Smokeless Tobacco Never Used  Tobacco Comment   quit smoking cigarettes in Dec 2019 but do backslide occasional, but smokes 3-4 cigars a week. small sigar on occasion 10/13/18      Nutrition Note  Spoke with pt. Nutrition Plan and Nutrition Survey goals reviewed with pt.   Pt has Pre-diabetes. Last A1c indicates blood glucose well-controlled.  We reviewed choosing fiber rich carbs, avoiding sugary beverages, and creating balanced meals.  Noted LDL 108 mg/dl and recently prescribed crestor 5 mg.  Acid reflux: occasional  Appetite: good Unintentional weight loss: none Difficulty eating: none - he wears O2 while procuring, preparing, and eating food and has no problems.  Fluids: 100 oz decaf coffee and 48 oz water daily   Meals per day: 3, 1 snack  Eating out: 4 times per week Overall he eats a balanced diet. Interested in working on A1C and lipid management with lifestyle/diet changes.   Pt expressed understanding of the information reviewed.   Nutrition Diagnosis ? Food-and nutrition-related knowledge deficit related to lack of exposure to information as related to diagnosis of: COPD ? CVD ? Pre-diabetes  Nutrition Intervention ? Pt's individual nutrition plan reviewed with pt ? Increased fiber diet ? 2000 mg plant stanol and sterols ? Continue client-centered nutrition education by RD, as part of interdisciplinary care.  Goal(s) ? Pt to build a healthy plate including vegetables, fruits, whole grains, and low-fat dairy products in a heart healthy meal plan. ? Pt to incorporate 2000 mg plant sterols/stanols per day ? Pt to incorporate 28 g fiber per day  Plan:   Will provide client-centered nutrition education as part of interdisciplinary care  Monitor and evaluate progress toward nutrition goal with team.   [name], MS, RDN, LDN

## 2020-09-06 NOTE — Progress Notes (Signed)
Daily Session Note  Patient Details  Name: Douglas Barrera MRN: [id number] Date of Birth: 14-Jun-1951 Referring Provider:   [name] Pulmonary Rehab Walk Test from 08/27/2020 in [place]  Referring Provider [name], DO      Encounter Date: 09/06/2020  Check In:  Session Check In - 09/06/20 1058      Check-In   Supervising physician immediately available to respond to emergencies [facility] immediately available    Physician(s) Dr. Horris Latino    Location [facility]    Staff Present Rosebud Poles, RN, BSN;Jayceon Troy Ysidro Evert, RN;Jessica Hassell Done, MS, ACSM-CEP, Exercise Physiologist    Virtual Visit No    Medication changes reported     No    Fall or balance concerns reported    No    Tobacco Cessation No Change    Warm-up and Cool-down Performed on first and last piece of equipment    Resistance Training Performed Yes    VAD Patient? No    PAD/SET Patient? No      Pain Assessment   Currently in Pain? No/denies    Multiple Pain Sites No           Capillary Blood Glucose: No results found for this or any previous visit (from the past 24 hour(s)).    Social History   Tobacco Use  Smoking Status Former Smoker  . Packs/day: 2.00  . Years: 49.00  . Pack years: 98.00  . Types: Cigars, Cigarettes  . Start date: 10/17/1968  . Quit date: 08/18/2018  . Years since quitting: 2.0  Smokeless Tobacco Never Used  Tobacco Comment   quit smoking cigarettes in Dec 2019 but do backslide occasional, but smokes 3-4 cigars a week. small sigar on occasion 10/13/18    Goals Met:  Exercise tolerated well No report of cardiac concerns or symptoms Strength training completed today  Goals Unmet:  Not Applicable  Comments: Service time is from 0945 to 1045    Dr. Fransico Him is Medical Director for Cardiac Rehab at [facility].

## 2020-09-11 ENCOUNTER — Other Ambulatory Visit: Payer: Self-pay

## 2020-09-11 ENCOUNTER — Encounter (HOSPITAL_COMMUNITY)
Admission: RE | Admit: 2020-09-11 | Discharge: 2020-09-11 | Disposition: A | Discharge status: Home / Self Care | Payer: Medicare Other | Source: Ambulatory Visit | Attending: Pulmonary Disease | Admitting: Pulmonary Disease

## 2020-09-11 DIAGNOSIS — J9611 Chronic respiratory failure with hypoxia: Secondary | ICD-10-CM | POA: Diagnosis not present

## 2020-09-11 NOTE — Progress Notes (Signed)
Daily Session Note  Patient Details  Name: Douglas Barrera MRN: [id number] Date of Birth: Aug 04, 1951 Referring Provider:   April Barrera Pulmonary Rehab Walk Test from 08/27/2020 in [place]  Referring Provider Douglas Plowman L. Icard, DO      Encounter Date: 09/11/2020  Check In:  Session Check In - 09/11/20 1026      Check-In   Supervising physician immediately available to respond to emergencies [facility] immediately available    Physician(s) Dr. Horris Latino    Location [facility]    Staff Present Rosebud Poles, RN, BSN;Carlette Wilber Oliphant, RN, Isaac Laud, MS, ACSM-CEP, Exercise Physiologist    Virtual Visit No    Medication changes reported     No    Fall or balance concerns reported    No    Tobacco Cessation No Change    Warm-up and Cool-down Performed on first and last piece of equipment    Resistance Training Performed Yes    VAD Patient? No    PAD/SET Patient? No      Pain Assessment   Currently in Pain? No/denies    Multiple Pain Sites No           Capillary Blood Glucose: No results found for this or any previous visit (from the past 24 hour(s)).   Exercise Prescription Changes - 09/11/20 1100      Response to Exercise   Blood Pressure (Admit) 112/70    Blood Pressure (Exercise) 116/64    Blood Pressure (Exit) 110/68    Heart Rate (Admit) 83 bpm    Heart Rate (Exercise) 97 bpm    Heart Rate (Exit) 90 bpm    Oxygen Saturation (Admit) 94 %    Oxygen Saturation (Exercise) 94 %    Oxygen Saturation (Exit) 92 %    Rating of Perceived Exertion (Exercise) 9    Perceived Dyspnea (Exercise) 0    Duration Continue with 30 min of aerobic exercise without signs/symptoms of physical distress.    Intensity --   40-80% HRR     Resistance Training   Training Prescription Yes    Weight Orange Bands    Reps 10-15    Time 10 Minutes      Oxygen   Oxygen Continuous    Liters 3      NuStep   Level 2    SPM 75     Minutes 15    METs 1.9      Track   Laps 11    Minutes 15           Social History   Tobacco Use  Smoking Status Former Smoker  . Packs/day: 2.00  . Years: 49.00  . Pack years: 98.00  . Types: Cigars, Cigarettes  . Start date: 10/17/1968  . Quit date: 08/18/2018  . Years since quitting: 2.0  Smokeless Tobacco Never Used  Tobacco Comment   quit smoking cigarettes in Dec 2019 but do backslide occasional, but smokes 3-4 cigars a week. small sigar on occasion 10/13/18    Goals Met:  Proper associated with RPD/PD & O2 Sat Exercise tolerated well Strength training completed today  Goals Unmet:  Not Applicable  Comments: Service time is from 0943 to 1100.    Dr. Fransico Barrera is Medical Director for Cardiac Rehab at [facility].

## 2020-09-11 NOTE — Progress Notes (Signed)
Pulmonary Individual Treatment Plan  Patient Details  Name: Douglas Barrera MRN: [id number] Date of Birth: [birth year] Referring Provider:   [name] Pulmonary Rehab Walk Test from 08/27/2020 in [place]  Referring Provider [name], DO      Initial Encounter Date:  Flowsheet Row Pulmonary Rehab Walk Test from 08/27/2020 in [facility]  Date 08/27/20      Visit Diagnosis: Chronic hypoxemic respiratory failure ([place])  Patient's Home Medications on Admission:   Current Outpatient Medications:  .  albuterol (VENTOLIN HFA) 108 (90 Base) MCG/ACT inhaler, Inhale 1 to 2 puffs 4 x day or every 4 hours to rescue asthma, Disp: 18 g, Rfl: 1 .  aspirin 81 MG tablet, Take 81 mg by mouth every evening. , Disp: , Rfl:  .  azithromycin (ZITHROMAX) 250 MG tablet, Take 2 tablets with Food on  Day 1, then 1 tablet Daily with Food for Infection (Patient not taking: Reported on 08/27/2020), Disp: 6 each, Rfl: 0 .  b complex vitamins tablet, Take 1 tablet by mouth every evening.  (Patient not taking: Reported on 08/27/2020), Disp: , Rfl:  .  budesonide (PULMICORT) 0.5 MG/2ML nebulizer solution, Take 2 mLs (0.5 mg total) by nebulization 2 (two) times daily., Disp: 120 mL, Rfl: 11 .  Cholecalciferol (VITAMIN D3) 5000 units CAPS, Take 10,000 Units by mouth daily. , Disp: , Rfl:  .  dexamethasone (DECADRON) 4 MG tablet, Take 1 tab 3 x day for 2  days, then 2 x day for 2 days, then 1 tab daily, Disp: 13 tablet, Rfl: 0 .  diphenhydrAMINE (BENADRYL) 25 MG tablet, Take 50 mg by mouth daily. , Disp: , Rfl:  .  fluticasone (FLONASE) 50 MCG/ACT nasal spray, Place 2 sprays into both nostrils daily. (Patient taking differently: Place 2 sprays into both nostrils every evening.), Disp: 48 g, Rfl: 3 .  formoterol (PERFOROMIST) 20 MCG/2ML nebulizer solution, Use 2 mls by Inhalation 2 x /day, Disp: 360 mL, Rfl: 2 .  guaifenesin (HUMIBID E) 400 MG TABS  tablet, Take 800 mg by mouth daily. , Disp: , Rfl:  .  ipratropium (ATROVENT) 0.06 % nasal spray, Use 1 to 2 sprays each nostril 2 to 3 x /day as needed, Disp: 45 mL, Rfl: 3 .  revefenacin (YUPELRI) 175 MCG/3ML nebulizer solution, Take 3 mLs (175 mcg total) by nebulization daily., Disp: 90 mL, Rfl: 11 .  rosuvastatin (CRESTOR) 5 MG tablet, Take 1 tab three days a week (WMF) in the evening for cholesterol., Disp: 38 tablet, Rfl: 3 .  traZODone (DESYREL) 150 MG tablet, TAKE 1 TABLET 1 HOUR BEFORE BEDTIME AS NEEDED FOR SLEEP, Disp: 90 tablet, Rfl: 3 .  trolamine salicylate (ASPERCREME) 10 % cream, Apply 1 application topically as needed for muscle pain., Disp: , Rfl:  .  zinc gluconate 50 MG tablet, Take 50 mg by mouth daily., Disp: , Rfl:   Past Medical History: Past Medical History:  Diagnosis Date  . COPD (chronic obstructive pulmonary disease) ([place])   . GERD (gastroesophageal reflux disease)   . History of elevated lipids   . Hyperlipidemia   . IBS (irritable bowel syndrome)   . Labile hypertension    patient is not on meds for hypertension  . Prediabetes   . Secondary polycythemia 05/25/2018  . Tubular adenoma of colon   . Vitamin D deficiency     Tobacco Use: Social History   Tobacco Use  Smoking Status Former  Smoker  . Packs/day: 2.00  . Years: 49.00  . Pack years: 98.00  . Types: Cigars, Cigarettes  . Start date: 10/17/1968  . Quit date: 08/18/2018  . Years since quitting: 2.0  Smokeless Tobacco Never Used  Tobacco Comment   quit smoking cigarettes in Dec 2019 but do backslide occasional, but smokes 3-4 cigars a week. small sigar on occasion 10/13/18    Labs: Recent Review Flowsheet Data    Labs for ITP Cardiac and Pulmonary Rehab Latest Ref Rng & Units 06/29/2019 09/29/2019 12/29/2019 04/03/2020 07/06/2020   Cholestrol <200 mg/dL 158 166 176 173 175   LDLCALC mg/dL (calc) 95 98 107(H) 106(H) 109(H)   HDL > OR = 40 mg/dL 44 44 43 46 47   Trlycerides <150 mg/dL 96 142  150(H) 114 97   Hemoglobin A1c <5.7 % of total Hgb - 5.8(H) - 6.0(H) -   PHART 7.350 - 7.450 - - - - -   PCO2ART 32.0 - 48.0 mmHg - - - - -   HCO3 20.0 - 28.0 mmol/L - - - - -   O2SAT % - - - - -      Capillary Blood Glucose: No results found for: GLUCAP   Pulmonary Assessment Scores:  Pulmonary Assessment Scores    Row Name 08/27/20 1118         ADL UCSD   ADL Phase Entry     SOB Score total 37           CAT Score   CAT Score 16           mMRC Score   mMRC Score 1           UCSD: Self-administered rating of dyspnea associated with activities of daily living (ADLs) 6-point scale (0 = "not at all" to 5 = "maximal or unable to do because of breathlessness")  Scoring Scores range from 0 to 120.  Minimally important difference is 5 units  CAT: CAT can identify the health impairment of COPD patients and is better correlated with disease progression.  CAT has a scoring range of zero to 40. The CAT score is classified into four groups of low (less than 10), medium (10 - 20), high (21-30) and very high (31-40) based on the impact level of disease on health status. A CAT score over 10 suggests significant symptoms.  A worsening CAT score could be explained by an exacerbation, poor medication adherence, poor inhaler technique, or progression of COPD or comorbid conditions.  CAT MCID is 2 points  mMRC: mMRC (Modified Medical Research Council) Dyspnea Scale is used to assess the degree of baseline functional disability in patients of respiratory disease due to dyspnea. No minimal important difference is established. A decrease in score of 1 point or greater is considered a positive change.   Pulmonary Function Assessment:  Pulmonary Function Assessment - 08/27/20 1105      Breath   Bilateral Breath Sounds Clear;Decreased    Shortness of Breath No           Exercise Target Goals: Exercise Program Goal: Individual exercise prescription set using results from initial 6  min walk test and THRR while considering  patient's activity barriers and safety.   Exercise Prescription Goal: Initial exercise prescription builds to 30-45 minutes a day of aerobic activity, 2-3 days per week.  Home exercise guidelines will be given to patient during program as part of exercise prescription that the participant will acknowledge.  Activity Barriers & Risk  Stratification:  Activity Barriers & Cardiac Risk Stratification - 08/27/20 1055      Activity Barriers & Cardiac Risk Stratification   Activity Barriers Deconditioning;Muscular Weakness;Shortness of Breath           6 Minute Walk:  6 Minute Walk    Row Name 08/27/20 1122         6 Minute Walk   Phase Initial     Distance 1140 feet     Walk Time 6 minutes     # of Rest Breaks 0     METS 2.16     RPE 11     Perceived Dyspnea  1     VO2 Peak 9.48     Symptoms Yes (comment)     Comments SOB, RPD = 1. No pain or other complaints noted.     Resting HR 68 bpm     Resting BP 98/60     Resting Oxygen Saturation  98 %     Exercise Oxygen Saturation  during 6 min walk 94 %     Max Ex. HR 107 bpm     Max Ex. BP 114/60     2 Minute Post BP 110/64           Interval HR   1 Minute HR 99     2 Minute HR 104     3 Minute HR 107     4 Minute HR 105     5 Minute HR 107     6 Minute HR 107     2 Minute Post HR 81     Interval Heart Rate? Yes           Interval Oxygen   Interval Oxygen? Yes     1 Minute Oxygen Saturation % 98 %     1 Minute Liters of Oxygen 3 L     2 Minute Oxygen Saturation % 96 %     2 Minute Liters of Oxygen 3 L     3 Minute Oxygen Saturation % 95 %     3 Minute Liters of Oxygen 3 L     4 Minute Oxygen Saturation % 94 %     4 Minute Liters of Oxygen 3 L     5 Minute Oxygen Saturation % 94 %     6 Minute Oxygen Saturation % 94 %     6 Minute Liters of Oxygen 3 L     2 Minute Post Oxygen Saturation % 97 %     2 Minute Post Liters of Oxygen 3 L            Oxygen Initial  Assessment:  Oxygen Initial Assessment - 08/27/20 1103      Home Oxygen   Home Oxygen Device E-Tanks;Home Concentrator    Sleep Oxygen Prescription Continuous    Liters per minute 2.5    Home Exercise Oxygen Prescription Pulsed    Liters per minute 3    Home Resting Oxygen Prescription Continuous    Liters per minute 2.5    Compliance with Home Oxygen Use Yes      Initial 6 min Walk   Oxygen Used Continuous    Liters per minute 3      Program Oxygen Prescription   Program Oxygen Prescription Continuous    Liters per minute 3      Intervention   Short Term Goals To learn and exhibit compliance with exercise, home and travel O2 prescription;To learn and  understand importance of monitoring SPO2 with pulse oximeter and demonstrate accurate use of the pulse oximeter.;To learn and understand importance of maintaining oxygen saturations>88%;To learn and demonstrate proper pursed lip breathing techniques or other breathing techniques.;To learn and demonstrate proper use of respiratory medications    Long  Term Goals Exhibits compliance with exercise, home and travel O2 prescription;Verbalizes importance of monitoring SPO2 with pulse oximeter and return demonstration;Maintenance of O2 saturations>88%;Exhibits proper breathing techniques, such as pursed lip breathing or other method taught during program session;Demonstrates proper use of MDI's;Compliance with respiratory medication           Oxygen Re-Evaluation:  Oxygen Re-Evaluation    Row Name 09/11/20 0709             Program Oxygen Prescription   Program Oxygen Prescription Continuous       Liters per minute 3               Home Oxygen   Home Oxygen Device E-Tanks;Home Concentrator       Sleep Oxygen Prescription Continuous       Liters per minute 2.5       Home Exercise Oxygen Prescription Pulsed       Liters per minute 3       Home Resting Oxygen Prescription Continuous       Liters per minute 2.5       Compliance  with Home Oxygen Use Yes               Goals/Expected Outcomes   Short Term Goals To learn and exhibit compliance with exercise, home and travel O2 prescription;To learn and understand importance of monitoring SPO2 with pulse oximeter and demonstrate accurate use of the pulse oximeter.;To learn and understand importance of maintaining oxygen saturations>88%;To learn and demonstrate proper pursed lip breathing techniques or other breathing techniques.;To learn and demonstrate proper use of respiratory medications       Long  Term Goals Exhibits compliance with exercise, home and travel O2 prescription;Verbalizes importance of monitoring SPO2 with pulse oximeter and return demonstration;Maintenance of O2 saturations>88%;Exhibits proper breathing techniques, such as pursed lip breathing or other method taught during program session;Demonstrates proper use of MDI's;Compliance with respiratory medication       Goals/Expected Outcomes Compliance and understanding of oxygen saturation and pursed lip breathing              Oxygen Discharge (Final Oxygen Re-Evaluation):  Oxygen Re-Evaluation - 09/11/20 0709      Program Oxygen Prescription   Program Oxygen Prescription Continuous    Liters per minute 3      Home Oxygen   Home Oxygen Device E-Tanks;Home Concentrator    Sleep Oxygen Prescription Continuous    Liters per minute 2.5    Home Exercise Oxygen Prescription Pulsed    Liters per minute 3    Home Resting Oxygen Prescription Continuous    Liters per minute 2.5    Compliance with Home Oxygen Use Yes      Goals/Expected Outcomes   Short Term Goals To learn and exhibit compliance with exercise, home and travel O2 prescription;To learn and understand importance of monitoring SPO2 with pulse oximeter and demonstrate accurate use of the pulse oximeter.;To learn and understand importance of maintaining oxygen saturations>88%;To learn and demonstrate proper pursed lip breathing techniques or  other breathing techniques.;To learn and demonstrate proper use of respiratory medications    Long  Term Goals Exhibits compliance with exercise, home and travel O2 prescription;Verbalizes importance of monitoring SPO2  with pulse oximeter and return demonstration;Maintenance of O2 saturations>88%;Exhibits proper breathing techniques, such as pursed lip breathing or other method taught during program session;Demonstrates proper use of MDI's;Compliance with respiratory medication    Goals/Expected Outcomes Compliance and understanding of oxygen saturation and pursed lip breathing           Initial Exercise Prescription:  Initial Exercise Prescription - 08/27/20 1200      Date of Initial Exercise RX and Referring Provider   Date 08/27/20    Referring Provider [name], DO    Expected Discharge Date 11/01/20      Oxygen   Oxygen Continuous    Liters 3      NuStep   Level 2    SPM 75    Minutes 15    METs 2      Track   Laps 10    Minutes 15    METs 2.16      Prescription Details   Frequency (times per week) 2    Duration Progress to 30 minutes of continuous aerobic without signs/symptoms of physical distress      Intensity   THRR 40-80% of Max Heartrate 61.122    Ratings of Perceived Exertion 11-13    Perceived Dyspnea 0-4      Progression   Progression Continue progressive overload as per policy without signs/symptoms or physical distress.      Resistance Training   Training Prescription Yes    Weight Orange Bands    Reps 10-15           Perform Capillary Blood Glucose checks as needed.  Exercise Prescription Changes:   Exercise Comments:  Exercise Comments    Row Name 09/04/20 1152           Exercise Comments Patient completed first day of exercise and tolerated well with no complaints or concerns              Exercise Goals and Review:  Exercise Goals    Row Name 08/27/20 1207             Exercise Goals   Increase Physical  Activity Yes       Intervention Provide advice, education, support and counseling about physical activity/exercise needs.;Develop an individualized exercise prescription for aerobic and resistive training based on initial evaluation findings, risk stratification, comorbidities and participant's personal goals.       Expected Outcomes Short Term: Attend rehab on a regular basis to increase amount of physical activity.;Long Term: Add in home exercise to make exercise part of routine and to increase amount of physical activity.       Increase Strength and Stamina Yes       Intervention Provide advice, education, support and counseling about physical activity/exercise needs.;Develop an individualized exercise prescription for aerobic and resistive training based on initial evaluation findings, risk stratification, comorbidities and participant's personal goals.       Expected Outcomes Short Term: Increase workloads from initial exercise prescription for resistance, speed, and METs.;Short Term: Perform resistance training exercises routinely during rehab and add in resistance training at home;Long Term: Improve cardiorespiratory fitness, muscular endurance and strength as measured by increased METs and functional capacity (6MWT)       Able to understand and use rate of perceived exertion (RPE) scale Yes       Intervention Provide education and explanation on how to use RPE scale       Expected Outcomes Short Term: Able to use RPE daily in  rehab to express subjective intensity level;Long Term:  Able to use RPE to guide intensity level when exercising independently       Able to understand and use Dyspnea scale Yes       Intervention Provide education and explanation on how to use Dyspnea scale       Expected Outcomes Short Term: Able to use Dyspnea scale daily in rehab to express subjective sense of shortness of breath during exertion;Long Term: Able to use Dyspnea scale to guide intensity level when exercising  independently       Knowledge and understanding of Target Heart Rate Range (THRR) Yes       Intervention Provide education and explanation of THRR including how the numbers were predicted and where they are located for reference       Expected Outcomes Short Term: Able to state/look up THRR;Short Term: Able to use daily as guideline for intensity in rehab;Long Term: Able to use THRR to govern intensity when exercising independently       Understanding of Exercise Prescription Yes       Intervention Provide education, explanation, and written materials on patient's individual exercise prescription       Expected Outcomes Short Term: Able to explain program exercise prescription;Long Term: Able to explain home exercise prescription to exercise independently              Exercise Goals Re-Evaluation :  Exercise Goals Re-Evaluation    Row Name 09/11/20 0710             Exercise Goal Re-Evaluation   Exercise Goals Review Increase Physical Activity;Increase Strength and Stamina;Able to understand and use rate of perceived exertion (RPE) scale;Able to understand and use Dyspnea scale;Knowledge and understanding of Target Heart Rate Range (THRR);Understanding of Exercise Prescription       Comments Trenell has completed 2 exercise sessions and has tolerated well so far. It is too early to see improvements, but we will continue to monitor and progress as he is able. He is already independent with doing resistance bands and seems very motviated. He is exercising at 2.0 METS on the Nustep and 2.6 METS walking the track.       Expected Outcomes Through exercise at rehab and home the patient will decrease shortness of breath with daily activities and feel confident in carrying out an exercise regimn at home.              Discharge Exercise Prescription (Final Exercise Prescription Changes):   Nutrition:  Target Goals: Understanding of nutrition guidelines, daily intake of sodium <1577m, cholesterol  <2030m calories 30% from fat and 7% or less from saturated fats, daily to have 5 or more servings of fruits and vegetables.  Biometrics:  Pre Biometrics - 08/27/20 1100      Pre Biometrics   Grip Strength 29 kg            Nutrition Therapy Plan and Nutrition Goals:  Nutrition Therapy & Goals - 09/06/20 1204      Nutrition Therapy   Diet TLC    Drug/Food Interactions Statins/Certain Fruits      Personal Nutrition Goals   Nutrition Goal Pt to build a healthy plate including vegetables, fruits, whole grains, and low-fat dairy products in a heart healthy meal plan    Personal Goal #2 Pt to incorporate 2000 mg plant sterols/stanols per day    Personal Goal #3 Pt to incorporate 28 g fiber per day      Intervention Plan  Intervention Prescribe, educate and counsel regarding individualized specific dietary modifications aiming towards targeted core components such as weight, hypertension, lipid management, diabetes, heart failure and other comorbidities.;Nutrition handout(s) given to patient.    Expected Outcomes Short Term Goal: Understand basic principles of dietary content, such as calories, fat, sodium, cholesterol and nutrients.;Long Term Goal: Adherence to prescribed nutrition plan.           Nutrition Assessments:  MEDIFICTS Score Key:  ?70 Need to make dietary changes   40-70 Heart Healthy Diet  ? 40 Therapeutic Level Cholesterol Diet  Flowsheet Row PULMONARY REHAB OTHER RESPIRATORY from 09/06/2020 in [facility]  Picture Your Plate Total Score on Admission 57     Picture Your Plate Scores:  <50 Unhealthy dietary pattern with much room for improvement.  41-50 Dietary pattern unlikely to meet recommendations for good health and room for improvement.  51-60 More healthful dietary pattern, with some room for improvement.   >60 Healthy dietary pattern, although there may be some specific behaviors that could be improved.     Nutrition Goals Re-Evaluation:  Nutrition Goals Re-Evaluation    [place] Name 09/06/20 1208             Goals   Current Weight 152 lb (68.9 kg)       Nutrition Goal Pt to build a healthy plate including vegetables, fruits, whole grains, and low-fat dairy products in a heart healthy meal plan               Personal Goal #2 Re-Evaluation   Personal Goal #2 Pt to incorporate 2000 mg plant sterols/stanols per day               Personal Goal #3 Re-Evaluation   Personal Goal #3 Pt to incorporate 28 g fiber per day              Nutrition Goals Discharge (Final Nutrition Goals Re-Evaluation):  Nutrition Goals Re-Evaluation - 09/06/20 1208      Goals   Current Weight 152 lb (68.9 kg)    Nutrition Goal Pt to build a healthy plate including vegetables, fruits, whole grains, and low-fat dairy products in a heart healthy meal plan      Personal Goal #2 Re-Evaluation   Personal Goal #2 Pt to incorporate 2000 mg plant sterols/stanols per day      Personal Goal #3 Re-Evaluation   Personal Goal #3 Pt to incorporate 28 g fiber per day           Psychosocial: Target Goals: Acknowledge presence or absence of significant depression and/or stress, maximize coping skills, provide positive support system. Participant is able to verbalize types and ability to use techniques and skills needed for reducing stress and depression.  Initial Review & Psychosocial Screening:  Initial Psych Review & Screening - 08/27/20 1106      Initial Review   Current issues with None Identified      Family Dynamics   Good Support System? Yes   wife supportive     Barriers   Psychosocial barriers to participate in program There are no identifiable barriers or psychosocial needs.      Screening Interventions   Interventions Encouraged to exercise           Quality of Life Scores:  Scores of 19 and below usually indicate a poorer quality of life in these areas.  A difference of  2-3 points is a  clinically meaningful difference.  A difference of  2-3 points in the total score of the Quality of Life Index has been associated with significant improvement in overall quality of life, self-image, physical symptoms, and general health in studies assessing change in quality of life.  PHQ-9: Recent Review Flowsheet Data    Depression screen [facility] 2/9 08/27/2020 04/02/2020 12/29/2019 10/01/2019 03/26/2019   Decreased Interest 0 0 0 0 0   Down, Depressed, Hopeless 0 0 0 0 0   PHQ - 2 Score 0 0 0 0 0   Altered sleeping 0 - - - -   Tired, decreased energy 0 - - - -   Change in appetite 0 - - - -   Feeling bad or failure about yourself  0 - - - -   Trouble concentrating 0 - - - -   Moving slowly or fidgety/restless 0 - - - -   Suicidal thoughts 0 - - - -   Difficult doing work/chores Not difficult at all - - - -     Interpretation of Total Score  Total Score Depression Severity:  1-4 = Minimal depression, 5-9 = Mild depression, 10-14 = Moderate depression, 15-19 = Moderately severe depression, 20-27 = Severe depression   Psychosocial Evaluation and Intervention:  Psychosocial Evaluation - 08/27/20 1107      Psychosocial Evaluation & Interventions   Interventions Encouraged to exercise with the program and follow exercise prescription    Comments No concerns identified    Expected Outcomes for [place] to continue to be free of psychosocial concerns while in pulmonary rehab.    Continue Psychosocial Services  No Follow up required           Psychosocial Re-Evaluation:  Psychosocial Re-Evaluation    [place] Name 09/10/20 0906             Psychosocial Re-Evaluation   Current issues with None Identified       Comments No current issues identified       Interventions Encouraged to attend Pulmonary Rehabilitation for the exercise       Continue Psychosocial Services  No Follow up required              Psychosocial Discharge (Final Psychosocial Re-Evaluation):  Psychosocial Re-Evaluation -  09/10/20 0906      Psychosocial Re-Evaluation   Current issues with None Identified    Comments No current issues identified    Interventions Encouraged to attend Pulmonary Rehabilitation for the exercise    Continue Psychosocial Services  No Follow up required           Education: Education Goals: Education classes will be provided on a weekly basis, covering required topics. Participant will state understanding/return demonstration of topics presented.  Learning Barriers/Preferences:  Learning Barriers/Preferences - 08/27/20 1108      Learning Barriers/Preferences   Learning Barriers None    Learning Preferences Computer/Internet           Education Topics: Risk Factor Reduction:  -Group instruction that is supported by a PowerPoint presentation. Instructor discusses the definition of a risk factor, different risk factors for pulmonary disease, and how the heart and lungs work together.   Flowsheet Row PULMONARY REHAB OTHER RESPIRATORY from 09/06/2020 in [facility]  Date 09/06/20  Educator Handout      Nutrition for Pulmonary Patient:  -Group instruction provided by PowerPoint slides, verbal discussion, and written materials to support subject matter. The instructor gives an explanation and review of healthy diet recommendations, which includes a discussion on  weight management, recommendations for fruit and vegetable consumption, as well as protein, fluid, caffeine, fiber, sodium, sugar, and alcohol. Tips for eating when patients are short of breath are discussed.   Pursed Lip Breathing:  -Group instruction that is supported by demonstration and informational handouts. Instructor discusses the benefits of pursed lip and diaphragmatic breathing and detailed demonstration on how to preform both.     Oxygen Safety:  -Group instruction provided by PowerPoint, verbal discussion, and written material to support subject matter. There is an  overview of "What is Oxygen" and "Why do we need it".  Instructor also reviews how to create a safe environment for oxygen use, the importance of using oxygen as prescribed, and the risks of noncompliance. There is a brief discussion on traveling with oxygen and resources the patient may utilize.   Oxygen Equipment:  -Group instruction provided by [facility] Staff utilizing handouts, written materials, and equipment demonstrations.   Signs and Symptoms:  -Group instruction provided by written material and verbal discussion to support subject matter. Warning signs and symptoms of infection, stroke, and heart attack are reviewed and when to call the physician/911 reinforced. Tips for preventing the spread of infection discussed.   Advanced Directives:  -Group instruction provided by verbal instruction and written material to support subject matter. Instructor reviews Advanced Directive laws and proper instruction for filling out document.   Pulmonary Video:  -Group video education that reviews the importance of medication and oxygen compliance, exercise, good nutrition, pulmonary hygiene, and pursed lip and diaphragmatic breathing for the pulmonary patient.   Exercise for the Pulmonary Patient:  -Group instruction that is supported by a PowerPoint presentation. Instructor discusses benefits of exercise, core components of exercise, frequency, duration, and intensity of an exercise routine, importance of utilizing pulse oximetry during exercise, safety while exercising, and options of places to exercise outside of rehab.     Pulmonary Medications:  -Verbally interactive group education provided by instructor with focus on inhaled medications and proper administration.   Anatomy and Physiology of the Respiratory System and Intimacy:  -Group instruction provided by PowerPoint, verbal discussion, and written material to support subject matter. Instructor reviews respiratory cycle and  anatomical components of the respiratory system and their functions. Instructor also reviews differences in obstructive and restrictive respiratory diseases with examples of each. Intimacy, Sex, and Sexuality differences are reviewed with a discussion on how relationships can change when diagnosed with pulmonary disease. Common sexual concerns are reviewed.   MD DAY -A group question and answer session with a medical doctor that allows participants to ask questions that relate to their pulmonary disease state.   OTHER EDUCATION -Group or individual verbal, written, or video instructions that support the educational goals of the pulmonary rehab program.   Holiday Eating Survival Tips:  -Group instruction provided by PowerPoint slides, verbal discussion, and written materials to support subject matter. The instructor gives patients tips, tricks, and techniques to help them not only survive but enjoy the holidays despite the onslaught of food that accompanies the holidays.   Knowledge Questionnaire Score:  Knowledge Questionnaire Score - 08/27/20 1218      Knowledge Questionnaire Score   Pre Score 16/18           Core Components/Risk Factors/Patient Goals at Admission:  Personal Goals and Risk Factors at Admission - 09/10/20 0907      Core Components/Risk Factors/Patient Goals on Admission   Improve shortness of breath with ADL's Yes    Intervention Provide education, individualized exercise  plan and daily activity instruction to help decrease symptoms of SOB with activities of daily living.    Expected Outcomes Short Term: Improve cardiorespiratory fitness to achieve a reduction of symptoms when performing ADLs;Long Term: Be able to perform more ADLs without symptoms or delay the onset of symptoms           Core Components/Risk Factors/Patient Goals Review:   Goals and Risk Factor Review    Row Name 09/10/20 0908             Core Components/Risk Factors/Patient Goals Review    Personal Goals Review Increase knowledge of respiratory medications and ability to use respiratory devices properly.;Tobacco Cessation;Improve shortness of breath with ADL's;Develop more efficient breathing techniques such as purse lipped breathing and diaphragmatic breathing and practicing self-pacing with activity.       Review Enzio just started the program, he has attended 2 exercise sessions and it is too early to have met any goals.  Will continue to support him with smoking cessation and increase workloads as appropriate.       Expected Outcomes For Alva to select a quit date and strength and stamina will improve over the next full 30 days.              Core Components/Risk Factors/Patient Goals at Discharge (Final Review):   Goals and Risk Factor Review - 09/10/20 0908      Core Components/Risk Factors/Patient Goals Review   Personal Goals Review Increase knowledge of respiratory medications and ability to use respiratory devices properly.;Tobacco Cessation;Improve shortness of breath with ADL's;Develop more efficient breathing techniques such as purse lipped breathing and diaphragmatic breathing and practicing self-pacing with activity.    Review Tosh just started the program, he has attended 2 exercise sessions and it is too early to have met any goals.  Will continue to support him with smoking cessation and increase workloads as appropriate.    Expected Outcomes For Ayrton to select a quit date and strength and stamina will improve over the next full 30 days.           ITP Comments:   Comments: ITP REVIEW Pt is making expected progress toward pulmonary rehab goals after completing 2 sessions. Recommend continued exercise, life style modification, education, and utilization of breathing techniques to increase stamina and strength and decrease shortness of breath with exertion.

## 2020-09-13 ENCOUNTER — Encounter (HOSPITAL_COMMUNITY): Payer: Medicare Other

## 2020-09-13 ENCOUNTER — Telehealth (HOSPITAL_COMMUNITY): Payer: Self-pay | Admitting: Internal Medicine

## 2020-09-18 ENCOUNTER — Encounter (HOSPITAL_COMMUNITY): Payer: Medicare Other

## 2020-09-20 ENCOUNTER — Encounter (HOSPITAL_COMMUNITY)
Admission: RE | Admit: 2020-09-20 | Discharge: 2020-09-20 | Disposition: A | Discharge status: Home / Self Care | Payer: Medicare Other | Source: Ambulatory Visit | Attending: Pulmonary Disease | Admitting: Pulmonary Disease

## 2020-09-20 ENCOUNTER — Other Ambulatory Visit: Payer: Self-pay

## 2020-09-20 DIAGNOSIS — J9611 Chronic respiratory failure with hypoxia: Secondary | ICD-10-CM

## 2020-09-20 NOTE — Progress Notes (Signed)
Daily Session Note  Patient Details  Name: Douglas Barrera MRN: [id number] Date of Birth: September 29, 1950 Referring Provider:   April Barrera Pulmonary Rehab Walk Test from 08/27/2020 in [place]  Referring Provider Douglas Plowman L. Icard, DO      Encounter Date: 09/20/2020  Check In:  Session Check In - 09/20/20 1001      Check-In   Physician(s) Dr. Florencia Barrera    Staff Present Douglas Estimable, MS, ACSM-CEP, Exercise Physiologist;Douglas Barrera Douglas Rotunda, RN, BSN    Virtual Visit No    Medication changes reported     No    Fall or balance concerns reported    No    Tobacco Cessation No Change    Warm-up and Cool-down Performed on first and last piece of equipment    Resistance Training Performed Yes    VAD Patient? No    PAD/SET Patient? No      Pain Assessment   Currently in Pain? No/denies    Multiple Pain Sites No           Capillary Blood Glucose: No results found for this or any previous visit (from the past 24 hour(s)).    Social History   Tobacco Use  Smoking Status Former Smoker  . Packs/day: 2.00  . Years: 49.00  . Pack years: 98.00  . Types: Cigars, Cigarettes  . Start date: 10/17/1968  . Quit date: 08/18/2018  . Years since quitting: 2.0  Smokeless Tobacco Never Used  Tobacco Comment   quit smoking cigarettes in Dec 2019 but do backslide occasional, but smokes 3-4 cigars a week. small sigar on occasion 10/13/18    Goals Met:  Proper associated with RPD/PD & O2 Sat Independence with exercise equipment Exercise tolerated well Strength training completed today  Goals Unmet:  Not Applicable  Comments: Service time is from 0938 to 1053   Dr. Fransico Barrera is Medical Director for Cardiac Rehab at [facility].

## 2020-09-25 ENCOUNTER — Encounter (HOSPITAL_COMMUNITY)
Admission: RE | Admit: 2020-09-25 | Discharge: 2020-09-25 | Disposition: A | Discharge status: Home / Self Care | Payer: Medicare Other | Source: Ambulatory Visit | Attending: Pulmonary Disease | Admitting: Pulmonary Disease

## 2020-09-25 ENCOUNTER — Other Ambulatory Visit: Payer: Self-pay

## 2020-09-25 VITALS — Wt 149.0 lb

## 2020-09-25 DIAGNOSIS — J9611 Chronic respiratory failure with hypoxia: Secondary | ICD-10-CM

## 2020-09-25 NOTE — Progress Notes (Signed)
Daily Session Note  Patient Details  Name: Douglas Barrera MRN: [id number] Date of Birth: 04-19-1951 Referring Provider:   [name] Pulmonary Rehab Walk Test from 08/27/2020 in [place]  Referring Provider [name], DO      Encounter Date: 09/25/2020  Check In:  Session Check In - 09/25/20 1138      Check-In   Supervising physician immediately available to respond to emergencies [facility] immediately available    Physician(s) Dr. Antonieta Pert    Location [facility]    Staff Present Rosebud Poles, RN, Isaac Laud, MS, ACSM-CEP, Exercise Physiologist    Virtual Visit No    Medication changes reported     No    Fall or balance concerns reported    No    Tobacco Cessation No Change    Warm-up and Cool-down Performed on first and last piece of equipment    Resistance Training Performed Yes    VAD Patient? No    PAD/SET Patient? No      Pain Assessment   Currently in Pain? No/denies    Multiple Pain Sites No           Capillary Blood Glucose: No results found for this or any previous visit (from the past 24 hour(s)).   Exercise Prescription Changes - 09/25/20 1100      Response to Exercise   Blood Pressure (Admit) 108/60    Blood Pressure (Exercise) 116/68    Blood Pressure (Exit) 92/60    Heart Rate (Admit) 94 bpm    Heart Rate (Exercise) 118 bpm    Heart Rate (Exit) 91 bpm    Oxygen Saturation (Admit) 96 %    Oxygen Saturation (Exercise) 91 %    Oxygen Saturation (Exit) 94 %    Rating of Perceived Exertion (Exercise) 12    Perceived Dyspnea (Exercise) 1    Duration Continue with 30 min of aerobic exercise without signs/symptoms of physical distress.    Intensity THRR unchanged      Resistance Training   Training Prescription Yes    Weight Orange Bands    Reps 10-15    Time 10 Minutes      Oxygen   Oxygen Continuous    Liters 3      NuStep   Level 3    SPM 80    Minutes 15    METs  2.3      Track   Laps 14    Minutes 15           Social History   Tobacco Use  Smoking Status Former Smoker  . Packs/day: 2.00  . Years: 49.00  . Pack years: 98.00  . Types: Cigars, Cigarettes  . Start date: 10/17/1968  . Quit date: 08/18/2018  . Years since quitting: 2.1  Smokeless Tobacco Never Used  Tobacco Comment   quit smoking cigarettes in Dec 2019 but do backslide occasional, but smokes 3-4 cigars a week. small sigar on occasion 10/13/18    Goals Met:  Proper associated with RPD/PD & O2 Sat Exercise tolerated well Strength training completed today  Goals Unmet:  Not Applicable  Comments: Service time is [id number] to 50    Dr. Fransico Him is Medical Director for Cardiac Rehab at [facility].

## 2020-09-27 ENCOUNTER — Telehealth (HOSPITAL_COMMUNITY): Payer: Self-pay | Admitting: Internal Medicine

## 2020-09-27 ENCOUNTER — Encounter (HOSPITAL_COMMUNITY): Payer: Medicare Other

## 2020-10-02 ENCOUNTER — Other Ambulatory Visit: Payer: Self-pay

## 2020-10-02 ENCOUNTER — Encounter (HOSPITAL_COMMUNITY)
Admission: RE | Admit: 2020-10-02 | Discharge: 2020-10-02 | Disposition: A | Discharge status: Home / Self Care | Payer: Medicare Other | Source: Ambulatory Visit | Attending: Pulmonary Disease | Admitting: Pulmonary Disease

## 2020-10-02 DIAGNOSIS — Z79899 Other long term (current) drug therapy: Secondary | ICD-10-CM | POA: Insufficient documentation

## 2020-10-02 DIAGNOSIS — Z87891 Personal history of nicotine dependence: Secondary | ICD-10-CM | POA: Insufficient documentation

## 2020-10-02 DIAGNOSIS — J9611 Chronic respiratory failure with hypoxia: Secondary | ICD-10-CM | POA: Diagnosis not present

## 2020-10-02 NOTE — Progress Notes (Signed)
Daily Session Note  Patient Details  Name: Douglas Barrera MRN: [id number] Date of Birth: 30-Mar-1951 Referring Provider:   [name] Pulmonary Rehab Walk Test from 08/27/2020 in [place]  Referring Provider [name], DO      Encounter Date: 10/02/2020  Check In:  Session Check In - 10/02/20 1113      Check-In   Supervising physician immediately available to respond to emergencies [facility] immediately available    Physician(s) Dr. Antonieta Pert    Location [facility]    Staff Present Rosebud Poles, RN, Isaac Laud, MS, ACSM-CEP, Exercise Physiologist;Carlette Wilber Oliphant, RN, BSN    Virtual Visit No    Medication changes reported     No    Tobacco Cessation No Change    Warm-up and Cool-down Performed on first and last piece of equipment    Resistance Training Performed Yes    VAD Patient? No    PAD/SET Patient? No      Pain Assessment   Currently in Pain? No/denies           Capillary Blood Glucose: No results found for this or any previous visit (from the past 24 hour(s)).    Social History   Tobacco Use  Smoking Status Former Smoker  . Packs/day: 2.00  . Years: 49.00  . Pack years: 98.00  . Types: Cigars, Cigarettes  . Start date: 10/17/1968  . Quit date: 08/18/2018  . Years since quitting: 2.1  Smokeless Tobacco Never Used  Tobacco Comment   quit smoking cigarettes in Dec 2019 but do backslide occasional, but smokes 3-4 cigars a week. small sigar on occasion 10/13/18    Goals Met:  Proper associated with RPD/PD & O2 Sat Exercise tolerated well Strength training completed today  Goals Unmet:  Not Applicable  Comments: Service time is from 0945 to [place]    Dr. Fransico Him is Medical Director for Cardiac Rehab at [facility].

## 2020-10-04 ENCOUNTER — Encounter (HOSPITAL_COMMUNITY)
Admission: RE | Admit: 2020-10-04 | Discharge: 2020-10-04 | Disposition: A | Discharge status: Home / Self Care | Payer: Medicare Other | Source: Ambulatory Visit | Attending: Pulmonary Disease | Admitting: Pulmonary Disease

## 2020-10-04 ENCOUNTER — Other Ambulatory Visit: Payer: Self-pay

## 2020-10-04 DIAGNOSIS — Z87891 Personal history of nicotine dependence: Secondary | ICD-10-CM | POA: Diagnosis not present

## 2020-10-04 DIAGNOSIS — J9611 Chronic respiratory failure with hypoxia: Secondary | ICD-10-CM | POA: Diagnosis not present

## 2020-10-04 DIAGNOSIS — Z79899 Other long term (current) drug therapy: Secondary | ICD-10-CM | POA: Diagnosis not present

## 2020-10-04 NOTE — Progress Notes (Signed)
Daily Session Note  Patient Details  Name: Douglas Barrera MRN: [id number] Date of Birth: [birth year] Referring Provider:   [name] Pulmonary Rehab Walk Test from 08/27/2020 in [place]  Referring Provider [name], DO      Encounter Date: 10/04/2020  Check In:  Session Check In - 10/04/20 1021      Check-In   Supervising physician immediately available to respond to emergencies [facility] immediately available    Physician(s) Dr. Eliseo Squires    Location [facility]    Staff Present Rosebud Poles, RN, BSN;Carlette Wilber Oliphant, RN, Isaac Laud, MS, ACSM-CEP, Exercise Physiologist    Virtual Visit No    Medication changes reported     No    Fall or balance concerns reported    No    Tobacco Cessation No Change    Warm-up and Cool-down Performed on first and last piece of equipment    Resistance Training Performed Yes    VAD Patient? No    PAD/SET Patient? No      Pain Assessment   Currently in Pain? No/denies    Multiple Pain Sites No           Capillary Blood Glucose: No results found for this or any previous visit (from the past 24 hour(s)).    Social History   Tobacco Use  Smoking Status Former Smoker  . Packs/day: 2.00  . Years: 49.00  . Pack years: 98.00  . Types: Cigars, Cigarettes  . Start date: 10/17/1968  . Quit date: 08/18/2018  . Years since quitting: 2.1  Smokeless Tobacco Never Used  Tobacco Comment   quit smoking cigarettes in Dec 2019 but do backslide occasional, but smokes 3-4 cigars a week. small sigar on occasion 10/13/18    Goals Met:  Proper associated with RPD/PD & O2 Sat Independence with exercise equipment Exercise tolerated well No report of cardiac concerns or symptoms Strength training completed today  Goals Unmet:  Not Applicable  Comments: Service time is from 0945 to 1045    Dr. Fransico Him is Medical Director for Cardiac Rehab at [facility].

## 2020-10-09 ENCOUNTER — Other Ambulatory Visit: Payer: Self-pay

## 2020-10-09 ENCOUNTER — Encounter (HOSPITAL_COMMUNITY)
Admission: RE | Admit: 2020-10-09 | Discharge: 2020-10-09 | Disposition: A | Discharge status: Home / Self Care | Payer: Medicare Other | Source: Ambulatory Visit | Attending: Pulmonary Disease | Admitting: Pulmonary Disease

## 2020-10-09 VITALS — Wt 150.4 lb

## 2020-10-09 DIAGNOSIS — J9611 Chronic respiratory failure with hypoxia: Secondary | ICD-10-CM | POA: Diagnosis not present

## 2020-10-09 DIAGNOSIS — Z79899 Other long term (current) drug therapy: Secondary | ICD-10-CM | POA: Diagnosis not present

## 2020-10-09 DIAGNOSIS — Z87891 Personal history of nicotine dependence: Secondary | ICD-10-CM | POA: Diagnosis not present

## 2020-10-09 NOTE — Progress Notes (Signed)
Pulmonary Individual Treatment Plan  Patient Details  Name: Douglas Barrera MRN: [id number] Date of Birth: 1951/03/30 Referring Provider:   [name] Pulmonary Rehab Walk Test from 08/27/2020 in [place]  Referring Provider [name], DO      Initial Encounter Date:  Flowsheet Row Pulmonary Rehab Walk Test from 08/27/2020 in [place]  Date 08/27/20      Visit Diagnosis: Chronic hypoxemic respiratory failure ([place])  Patient's Home Medications on Admission:   Current Outpatient Medications:  .  albuterol (VENTOLIN HFA) 108 (90 Base) MCG/ACT inhaler, Inhale 1 to 2 puffs 4 x day or every 4 hours to rescue asthma, Disp: 18 g, Rfl: 1 .  aspirin 81 MG tablet, Take 81 mg by mouth every evening. , Disp: , Rfl:  .  azithromycin (ZITHROMAX) 250 MG tablet, Take 2 tablets with Food on  Day 1, then 1 tablet Daily with Food for Infection (Patient not taking: Reported on 08/27/2020), Disp: 6 each, Rfl: 0 .  b complex vitamins tablet, Take 1 tablet by mouth every evening.  (Patient not taking: Reported on 08/27/2020), Disp: , Rfl:  .  budesonide (PULMICORT) 0.5 MG/2ML nebulizer solution, Take 2 mLs (0.5 mg total) by nebulization 2 (two) times daily., Disp: 120 mL, Rfl: 11 .  Cholecalciferol (VITAMIN D3) 5000 units CAPS, Take 10,000 Units by mouth daily. , Disp: , Rfl:  .  dexamethasone (DECADRON) 4 MG tablet, Take 1 tab 3 x day for 2  days, then 2 x day for 2 days, then 1 tab daily, Disp: 13 tablet, Rfl: 0 .  diphenhydrAMINE (BENADRYL) 25 MG tablet, Take 50 mg by mouth daily. , Disp: , Rfl:  .  fluticasone (FLONASE) 50 MCG/ACT nasal spray, Place 2 sprays into both nostrils daily. (Patient taking differently: Place 2 sprays into both nostrils every evening.), Disp: 48 g, Rfl: 3 .  formoterol (PERFOROMIST) 20 MCG/2ML nebulizer solution, Use 2 mls by Inhalation 2 x /day, Disp: 360 mL, Rfl: 2 .  guaifenesin (HUMIBID E) 400 MG TABS  tablet, Take 800 mg by mouth daily. , Disp: , Rfl:  .  ipratropium (ATROVENT) 0.06 % nasal spray, Use 1 to 2 sprays each nostril 2 to 3 x /day as needed, Disp: 45 mL, Rfl: 3 .  revefenacin (YUPELRI) 175 MCG/3ML nebulizer solution, Take 3 mLs (175 mcg total) by nebulization daily., Disp: 90 mL, Rfl: 11 .  rosuvastatin (CRESTOR) 5 MG tablet, Take 1 tab three days a week (WMF) in the evening for cholesterol., Disp: 38 tablet, Rfl: 3 .  traZODone (DESYREL) 150 MG tablet, TAKE 1 TABLET 1 HOUR BEFORE BEDTIME AS NEEDED FOR SLEEP, Disp: 90 tablet, Rfl: 3 .  trolamine salicylate (ASPERCREME) 10 % cream, Apply 1 application topically as needed for muscle pain., Disp: , Rfl:  .  zinc gluconate 50 MG tablet, Take 50 mg by mouth daily., Disp: , Rfl:   Past Medical History: Past Medical History:  Diagnosis Date  . COPD (chronic obstructive pulmonary disease) ([place])   . GERD (gastroesophageal reflux disease)   . History of elevated lipids   . Hyperlipidemia   . IBS (irritable bowel syndrome)   . Labile hypertension    patient is not on meds for hypertension  . Prediabetes   . Secondary polycythemia 05/25/2018  . Tubular adenoma of colon   . Vitamin D deficiency     Tobacco Use: Social History   Tobacco Use  Smoking Status Former  Smoker  . Packs/day: 2.00  . Years: 49.00  . Pack years: 98.00  . Types: Cigars, Cigarettes  . Start date: 10/17/1968  . Quit date: 08/18/2018  . Years since quitting: 2.1  Smokeless Tobacco Never Used  Tobacco Comment   quit smoking cigarettes in Dec 2019 but do backslide occasional, but smokes 3-4 cigars a week. small sigar on occasion 10/13/18    Labs: Recent Review Flowsheet Data    Labs for ITP Cardiac and Pulmonary Rehab Latest Ref Rng & Units 06/29/2019 09/29/2019 12/29/2019 04/03/2020 07/06/2020   Cholestrol <200 mg/dL 158 166 176 173 175   LDLCALC mg/dL (calc) 95 98 107(H) 106(H) 109(H)   HDL > OR = 40 mg/dL 44 44 43 46 47   Trlycerides <150 mg/dL 96 142  150(H) 114 97   Hemoglobin A1c <5.7 % of total Hgb - 5.8(H) - 6.0(H) -   PHART 7.350 - 7.450 - - - - -   PCO2ART 32.0 - 48.0 mmHg - - - - -   HCO3 20.0 - 28.0 mmol/L - - - - -   O2SAT % - - - - -      Capillary Blood Glucose: No results found for: GLUCAP   Pulmonary Assessment Scores:  Pulmonary Assessment Scores    Row Name 08/27/20 1118         ADL UCSD   ADL Phase Entry     SOB Score total 37           CAT Score   CAT Score 16           mMRC Score   mMRC Score 1           UCSD: Self-administered rating of dyspnea associated with activities of daily living (ADLs) 6-point scale (0 = "not at all" to 5 = "maximal or unable to do because of breathlessness")  Scoring Scores range from 0 to 120.  Minimally important difference is 5 units  CAT: CAT can identify the health impairment of COPD patients and is better correlated with disease progression.  CAT has a scoring range of zero to 40. The CAT score is classified into four groups of low (less than 10), medium (10 - 20), high (21-30) and very high (31-40) based on the impact level of disease on health status. A CAT score over 10 suggests significant symptoms.  A worsening CAT score could be explained by an exacerbation, poor medication adherence, poor inhaler technique, or progression of COPD or comorbid conditions.  CAT MCID is 2 points  mMRC: mMRC (Modified Medical Research Council) Dyspnea Scale is used to assess the degree of baseline functional disability in patients of respiratory disease due to dyspnea. No minimal important difference is established. A decrease in score of 1 point or greater is considered a positive change.   Pulmonary Function Assessment:  Pulmonary Function Assessment - 08/27/20 1105      Breath   Bilateral Breath Sounds Clear;Decreased    Shortness of Breath No           Exercise Target Goals: Exercise Program Goal: Individual exercise prescription set using results from initial 6  min walk test and THRR while considering  patient's activity barriers and safety.   Exercise Prescription Goal: Initial exercise prescription builds to 30-45 minutes a day of aerobic activity, 2-3 days per week.  Home exercise guidelines will be given to patient during program as part of exercise prescription that the participant will acknowledge.  Activity Barriers & Risk  Stratification:  Activity Barriers & Cardiac Risk Stratification - 08/27/20 1055      Activity Barriers & Cardiac Risk Stratification   Activity Barriers Deconditioning;Muscular Weakness;Shortness of Breath           6 Minute Walk:  6 Minute Walk    Row Name 08/27/20 1122         6 Minute Walk   Phase Initial     Distance 1140 feet     Walk Time 6 minutes     # of Rest Breaks 0     METS 2.16     RPE 11     Perceived Dyspnea  1     VO2 Peak 9.48     Symptoms Yes (comment)     Comments SOB, RPD = 1. No pain or other complaints noted.     Resting HR 68 bpm     Resting BP 98/60     Resting Oxygen Saturation  98 %     Exercise Oxygen Saturation  during 6 min walk 94 %     Max Ex. HR 107 bpm     Max Ex. BP 114/60     2 Minute Post BP 110/64           Interval HR   1 Minute HR 99     2 Minute HR 104     3 Minute HR 107     4 Minute HR 105     5 Minute HR 107     6 Minute HR 107     2 Minute Post HR 81     Interval Heart Rate? Yes           Interval Oxygen   Interval Oxygen? Yes     1 Minute Oxygen Saturation % 98 %     1 Minute Liters of Oxygen 3 L     2 Minute Oxygen Saturation % 96 %     2 Minute Liters of Oxygen 3 L     3 Minute Oxygen Saturation % 95 %     3 Minute Liters of Oxygen 3 L     4 Minute Oxygen Saturation % 94 %     4 Minute Liters of Oxygen 3 L     5 Minute Oxygen Saturation % 94 %     6 Minute Oxygen Saturation % 94 %     6 Minute Liters of Oxygen 3 L     2 Minute Post Oxygen Saturation % 97 %     2 Minute Post Liters of Oxygen 3 L            Oxygen Initial  Assessment:  Oxygen Initial Assessment - 08/27/20 1103      Home Oxygen   Home Oxygen Device E-Tanks;Home Concentrator    Sleep Oxygen Prescription Continuous    Liters per minute 2.5    Home Exercise Oxygen Prescription Pulsed    Liters per minute 3    Home Resting Oxygen Prescription Continuous    Liters per minute 2.5    Compliance with Home Oxygen Use Yes      Initial 6 min Walk   Oxygen Used Continuous    Liters per minute 3      Program Oxygen Prescription   Program Oxygen Prescription Continuous    Liters per minute 3      Intervention   Short Term Goals To learn and exhibit compliance with exercise, home and travel O2 prescription;To learn and  understand importance of monitoring SPO2 with pulse oximeter and demonstrate accurate use of the pulse oximeter.;To learn and understand importance of maintaining oxygen saturations>88%;To learn and demonstrate proper pursed lip breathing techniques or other breathing techniques.;To learn and demonstrate proper use of respiratory medications    Long  Term Goals Exhibits compliance with exercise, home and travel O2 prescription;Verbalizes importance of monitoring SPO2 with pulse oximeter and return demonstration;Maintenance of O2 saturations>88%;Exhibits proper breathing techniques, such as pursed lip breathing or other method taught during program session;Demonstrates proper use of MDI's;Compliance with respiratory medication           Oxygen Re-Evaluation:  Oxygen Re-Evaluation    Row Name 09/11/20 0709 10/09/20 0849           Program Oxygen Prescription   Program Oxygen Prescription Continuous Continuous      Liters per minute 3 3             Home Oxygen   Home Oxygen Device E-Tanks;Home Concentrator E-Tanks;Home Concentrator      Sleep Oxygen Prescription Continuous Continuous      Liters per minute 2.5 2.5      Home Exercise Oxygen Prescription Pulsed Pulsed      Liters per minute 3 3      Home Resting Oxygen  Prescription Continuous Pulsed      Liters per minute 2.5 2.5      Compliance with Home Oxygen Use Yes Yes             Goals/Expected Outcomes   Short Term Goals To learn and exhibit compliance with exercise, home and travel O2 prescription;To learn and understand importance of monitoring SPO2 with pulse oximeter and demonstrate accurate use of the pulse oximeter.;To learn and understand importance of maintaining oxygen saturations>88%;To learn and demonstrate proper pursed lip breathing techniques or other breathing techniques.;To learn and demonstrate proper use of respiratory medications To learn and exhibit compliance with exercise, home and travel O2 prescription;To learn and understand importance of monitoring SPO2 with pulse oximeter and demonstrate accurate use of the pulse oximeter.;To learn and understand importance of maintaining oxygen saturations>88%;To learn and demonstrate proper pursed lip breathing techniques or other breathing techniques.;To learn and demonstrate proper use of respiratory medications      Long  Term Goals Exhibits compliance with exercise, home and travel O2 prescription;Verbalizes importance of monitoring SPO2 with pulse oximeter and return demonstration;Maintenance of O2 saturations>88%;Exhibits proper breathing techniques, such as pursed lip breathing or other method taught during program session;Demonstrates proper use of MDI's;Compliance with respiratory medication Exhibits compliance with exercise, home and travel O2 prescription;Verbalizes importance of monitoring SPO2 with pulse oximeter and return demonstration;Maintenance of O2 saturations>88%;Exhibits proper breathing techniques, such as pursed lip breathing or other method taught during program session;Demonstrates proper use of MDI's;Compliance with respiratory medication      Goals/Expected Outcomes Compliance and understanding of oxygen saturation and pursed lip breathing Compliance and understanding of  oxygen saturation and pursed lip breathing             Oxygen Discharge (Final Oxygen Re-Evaluation):  Oxygen Re-Evaluation - 10/09/20 0849      Program Oxygen Prescription   Program Oxygen Prescription Continuous    Liters per minute 3      Home Oxygen   Home Oxygen Device E-Tanks;Home Concentrator    Sleep Oxygen Prescription Continuous    Liters per minute 2.5    Home Exercise Oxygen Prescription Pulsed    Liters per minute 3    Home Resting Oxygen  Prescription Pulsed    Liters per minute 2.5    Compliance with Home Oxygen Use Yes      Goals/Expected Outcomes   Short Term Goals To learn and exhibit compliance with exercise, home and travel O2 prescription;To learn and understand importance of monitoring SPO2 with pulse oximeter and demonstrate accurate use of the pulse oximeter.;To learn and understand importance of maintaining oxygen saturations>88%;To learn and demonstrate proper pursed lip breathing techniques or other breathing techniques.;To learn and demonstrate proper use of respiratory medications    Long  Term Goals Exhibits compliance with exercise, home and travel O2 prescription;Verbalizes importance of monitoring SPO2 with pulse oximeter and return demonstration;Maintenance of O2 saturations>88%;Exhibits proper breathing techniques, such as pursed lip breathing or other method taught during program session;Demonstrates proper use of MDI's;Compliance with respiratory medication    Goals/Expected Outcomes Compliance and understanding of oxygen saturation and pursed lip breathing           Initial Exercise Prescription:  Initial Exercise Prescription - 08/27/20 1200      Date of Initial Exercise RX and Referring Provider   Date 08/27/20    Referring Provider [name], DO    Expected Discharge Date 11/01/20      Oxygen   Oxygen Continuous    Liters 3      NuStep   Level 2    SPM 75    Minutes 15    METs 2      Track   Laps 10    Minutes 15     METs 2.16      Prescription Details   Frequency (times per week) 2    Duration Progress to 30 minutes of continuous aerobic without signs/symptoms of physical distress      Intensity   THRR 40-80% of Max Heartrate 61.122    Ratings of Perceived Exertion 11-13    Perceived Dyspnea 0-4      Progression   Progression Continue progressive overload as per policy without signs/symptoms or physical distress.      Resistance Training   Training Prescription Yes    Weight Orange Bands    Reps 10-15           Perform Capillary Blood Glucose checks as needed.  Exercise Prescription Changes:  Exercise Prescription Changes    Row Name 09/11/20 1100 09/25/20 1100           Response to Exercise   Blood Pressure (Admit) 112/70 108/60      Blood Pressure (Exercise) 116/64 116/68      Blood Pressure (Exit) 110/68 92/60      Heart Rate (Admit) 83 bpm 94 bpm      Heart Rate (Exercise) 97 bpm 118 bpm      Heart Rate (Exit) 90 bpm 91 bpm      Oxygen Saturation (Admit) 94 % 96 %      Oxygen Saturation (Exercise) 94 % 91 %      Oxygen Saturation (Exit) 92 % 94 %      Rating of Perceived Exertion (Exercise) 9 12      Perceived Dyspnea (Exercise) 0 1      Duration Continue with 30 min of aerobic exercise without signs/symptoms of physical distress. Continue with 30 min of aerobic exercise without signs/symptoms of physical distress.      Intensity -  40-80% HRR THRR unchanged             Resistance Training   Training Prescription Yes Yes  Weight Orange Bands Orange Bands      Reps 10-15 10-15      Time 10 Minutes 10 Minutes             Oxygen   Oxygen Continuous Continuous      Liters 3 3             NuStep   Level 2 3      SPM 75 80      Minutes 15 15      METs 1.9 2.3             Track   Laps 11 14      Minutes 15 15             Exercise Comments:  Exercise Comments    Row Name 09/04/20 1152           Exercise Comments Patient completed first day of  exercise and tolerated well with no complaints or concerns              Exercise Goals and Review:  Exercise Goals    Row Name 08/27/20 1207             Exercise Goals   Increase Physical Activity Yes       Intervention Provide advice, education, support and counseling about physical activity/exercise needs.;Develop an individualized exercise prescription for aerobic and resistive training based on initial evaluation findings, risk stratification, comorbidities and participant's personal goals.       Expected Outcomes Short Term: Attend rehab on a regular basis to increase amount of physical activity.;Long Term: Add in home exercise to make exercise part of routine and to increase amount of physical activity.       Increase Strength and Stamina Yes       Intervention Provide advice, education, support and counseling about physical activity/exercise needs.;Develop an individualized exercise prescription for aerobic and resistive training based on initial evaluation findings, risk stratification, comorbidities and participant's personal goals.       Expected Outcomes Short Term: Increase workloads from initial exercise prescription for resistance, speed, and METs.;Short Term: Perform resistance training exercises routinely during rehab and add in resistance training at home;Long Term: Improve cardiorespiratory fitness, muscular endurance and strength as measured by increased METs and functional capacity (6MWT)       Able to understand and use rate of perceived exertion (RPE) scale Yes       Intervention Provide education and explanation on how to use RPE scale       Expected Outcomes Short Term: Able to use RPE daily in rehab to express subjective intensity level;Long Term:  Able to use RPE to guide intensity level when exercising independently       Able to understand and use Dyspnea scale Yes       Intervention Provide education and explanation on how to use Dyspnea scale       Expected  Outcomes Short Term: Able to use Dyspnea scale daily in rehab to express subjective sense of shortness of breath during exertion;Long Term: Able to use Dyspnea scale to guide intensity level when exercising independently       Knowledge and understanding of Target Heart Rate Range (THRR) Yes       Intervention Provide education and explanation of THRR including how the numbers were predicted and where they are located for reference       Expected Outcomes Short Term: Able to state/look up THRR;Short Term: Able to use daily  as guideline for intensity in rehab;Long Term: Able to use THRR to govern intensity when exercising independently       Understanding of Exercise Prescription Yes       Intervention Provide education, explanation, and written materials on patient's individual exercise prescription       Expected Outcomes Short Term: Able to explain program exercise prescription;Long Term: Able to explain home exercise prescription to exercise independently              Exercise Goals Re-Evaluation :  Exercise Goals Re-Evaluation    Row Name 09/11/20 0710 10/09/20 0849           Exercise Goal Re-Evaluation   Exercise Goals Review Increase Physical Activity;Increase Strength and Stamina;Able to understand and use rate of perceived exertion (RPE) scale;Able to understand and use Dyspnea scale;Knowledge and understanding of Target Heart Rate Range (THRR);Understanding of Exercise Prescription Increase Physical Activity;Increase Strength and Stamina;Able to understand and use rate of perceived exertion (RPE) scale;Able to understand and use Dyspnea scale;Knowledge and understanding of Target Heart Rate Range (THRR);Understanding of Exercise Prescription      Comments Deo has completed 2 exercise sessions and has tolerated well so far. It is too early to see improvements, but we will continue to monitor and progress as he is able. He is already independent with doing resistance bands and seems very  motviated. He is exercising at 2.0 METS on the Nustep and 2.6 METS walking the track. Ramy has completed 7 exercise sessions and has made progressions with workload increases. He has tolerated exercise very well and is independent with all of the exercises. He is exercising at 2.4 METS on the Nustep and 2.39 METS walking the track. Will continue to monitor and progress as he is able.      Expected Outcomes Through exercise at rehab and home the patient will decrease shortness of breath with daily activities and feel confident in carrying out an exercise regimn at home. Through exercise at rehab and home the patient will decrease shortness of breath with daily activities and feel confident in carrying out an exercise regimn at home.             Discharge Exercise Prescription (Final Exercise Prescription Changes):  Exercise Prescription Changes - 09/25/20 1100      Response to Exercise   Blood Pressure (Admit) 108/60    Blood Pressure (Exercise) 116/68    Blood Pressure (Exit) 92/60    Heart Rate (Admit) 94 bpm    Heart Rate (Exercise) 118 bpm    Heart Rate (Exit) 91 bpm    Oxygen Saturation (Admit) 96 %    Oxygen Saturation (Exercise) 91 %    Oxygen Saturation (Exit) 94 %    Rating of Perceived Exertion (Exercise) 12    Perceived Dyspnea (Exercise) 1    Duration Continue with 30 min of aerobic exercise without signs/symptoms of physical distress.    Intensity THRR unchanged      Resistance Training   Training Prescription Yes    Weight Orange Bands    Reps 10-15    Time 10 Minutes      Oxygen   Oxygen Continuous    Liters 3      NuStep   Level 3    SPM 80    Minutes 15    METs 2.3      Track   Laps 14    Minutes 15           Nutrition:  Target Goals: Understanding of nutrition guidelines, daily intake of sodium <1545m, cholesterol <2033m calories 30% from fat and 7% or less from saturated fats, daily to have 5 or more servings of fruits and  vegetables.  Biometrics:  Pre Biometrics - 08/27/20 1100      Pre Biometrics   Grip Strength 29 kg            Nutrition Therapy Plan and Nutrition Goals:  Nutrition Therapy & Goals - 09/06/20 1204      Nutrition Therapy   Diet TLC    Drug/Food Interactions Statins/Certain Fruits      Personal Nutrition Goals   Nutrition Goal Pt to build a healthy plate including vegetables, fruits, whole grains, and low-fat dairy products in a heart healthy meal plan    Personal Goal #2 Pt to incorporate 2000 mg plant sterols/stanols per day    Personal Goal #3 Pt to incorporate 28 g fiber per day      Intervention Plan   Intervention Prescribe, educate and counsel regarding individualized specific dietary modifications aiming towards targeted core components such as weight, hypertension, lipid management, diabetes, heart failure and other comorbidities.;Nutrition handout(s) given to patient.    Expected Outcomes Short Term Goal: Understand basic principles of dietary content, such as calories, fat, sodium, cholesterol and nutrients.;Long Term Goal: Adherence to prescribed nutrition plan.           Nutrition Assessments:  MEDIFICTS Score Key:  ?70 Need to make dietary changes   40-70 Heart Healthy Diet  ? 40 Therapeutic Level Cholesterol Diet  Flowsheet Row PULMONARY REHAB OTHER RESPIRATORY [facility] on [facility] 57     Picture Your Plate Scores:  <4<09nhealthy dietary pattern with much room for improvement.  41-50 Dietary pattern unlikely to meet recommendations for good health and room for improvement.  51-60 More healthful dietary pattern, with some room for improvement.   >60 Healthy dietary pattern, although there may be some specific behaviors that could be improved.    Nutrition Goals Re-Evaluation:  Nutrition Goals Re-Evaluation    [place] 09/06/20 1208 10/08/20 0736            Goals   Current Weight 152 lb (68.9 kg) 148 lb 13 oz (67.5 kg)      Nutrition Goal Pt to build a healthy plate including vegetables, fruits, whole grains, and low-fat dairy products in a heart healthy meal plan Pt to build a healthy plate including vegetables, fruits, whole grains, and low-fat dairy products in a heart healthy meal plan      Expected Outcome - Lipid management             Personal Goal #2 Re-Evaluation   Personal Goal #2 Pt to incorporate 2000 mg plant sterols/stanols per day Pt to incorporate 2000 mg plant sterols/stanols per day             Personal Goal #3 Re-Evaluation   Personal Goal #3 Pt to incorporate 28 g fiber per day Pt to incorporate 28 g fiber per day             Nutrition Goals Discharge (Final Nutrition Goals Re-Evaluation):  Nutrition Goals Re-Evaluation - 10/08/20 0736      Goals   Current Weight 148 lb 13 oz (67.5 kg)    Nutrition Goal Pt to build a healthy plate including vegetables, fruits, whole grains, and low-fat dairy products in a heart healthy meal  plan    Expected Outcome Lipid management      Personal Goal #2 Re-Evaluation   Personal Goal #2 Pt to incorporate 2000 mg plant sterols/stanols per day      Personal Goal #3 Re-Evaluation   Personal Goal #3 Pt to incorporate 28 g fiber per day           Psychosocial: Target Goals: Acknowledge presence or absence of significant depression and/or stress, maximize coping skills, provide positive support system. Participant is able to verbalize types and ability to use techniques and skills needed for reducing stress and depression.  Initial Review & Psychosocial Screening:  Initial Psych Review & Screening - 08/27/20 1106      Initial Review   Current issues with None Identified      Family Dynamics   Good Support System? Yes   wife supportive     Barriers   Psychosocial barriers to participate in program There are no identifiable barriers or psychosocial needs.      Screening  Interventions   Interventions Encouraged to exercise           Quality of Life Scores:  Scores of 19 and below usually indicate a poorer quality of life in these areas.  A difference of  2-3 points is a clinically meaningful difference.  A difference of 2-3 points in the total score of the Quality of Life Index has been associated with significant improvement in overall quality of life, self-image, physical symptoms, and general health in studies assessing change in quality of life.  PHQ-9: Recent Review Flowsheet Data    Depression screen [facility] 2/9 08/27/2020 04/02/2020 12/29/2019 10/01/2019 03/26/2019   Decreased Interest 0 0 0 0 0   Down, Depressed, Hopeless 0 0 0 0 0   PHQ - 2 Score 0 0 0 0 0   Altered sleeping 0 - - - -   Tired, decreased energy 0 - - - -   Change in appetite 0 - - - -   Feeling bad or failure about yourself  0 - - - -   Trouble concentrating 0 - - - -   Moving slowly or fidgety/restless 0 - - - -   Suicidal thoughts 0 - - - -   Difficult doing work/chores Not difficult at all - - - -     Interpretation of Total Score  Total Score Depression Severity:  1-4 = Minimal depression, 5-9 = Mild depression, 10-14 = Moderate depression, 15-19 = Moderately severe depression, 20-27 = Severe depression   Psychosocial Evaluation and Intervention:  Psychosocial Evaluation - 08/27/20 1107      Psychosocial Evaluation & Interventions   Interventions Encouraged to exercise with the program and follow exercise prescription    Comments No concerns identified    Expected Outcomes for Loen to continue to be free of psychosocial concerns while in pulmonary rehab.    Continue Psychosocial Services  No Follow up required           Psychosocial Re-Evaluation:  Psychosocial Re-Evaluation    Flowella Name 09/10/20 0906 10/08/20 1127           Psychosocial Re-Evaluation   Current issues with None Identified None Identified      Comments No current issues identified No concerns  identified at this time      Expected Outcomes - For Keena to continue to have no psychosocial concerns while participating in pulmonary rehab.      Interventions Encouraged to attend Pulmonary Rehabilitation for  the exercise Encouraged to attend Pulmonary Rehabilitation for the exercise      Continue Psychosocial Services  No Follow up required No Follow up required             Psychosocial Discharge (Final Psychosocial Re-Evaluation):  Psychosocial Re-Evaluation - 10/08/20 1127      Psychosocial Re-Evaluation   Current issues with None Identified    Comments No concerns identified at this time    Expected Outcomes For Barbara to continue to have no psychosocial concerns while participating in pulmonary rehab.    Interventions Encouraged to attend Pulmonary Rehabilitation for the exercise    Continue Psychosocial Services  No Follow up required           Education: Education Goals: Education classes will be provided on a weekly basis, covering required topics. Participant will state understanding/return demonstration of topics presented.  Learning Barriers/Preferences:  Learning Barriers/Preferences - 08/27/20 1108      Learning Barriers/Preferences   Learning Barriers None    Learning Preferences Computer/Internet           Education Topics: Risk Factor Reduction:  -Group instruction that is supported by a PowerPoint presentation. Instructor discusses the definition of a risk factor, different risk factors for pulmonary disease, and how the heart and lungs work together.   Flowsheet Row PULMONARY REHAB OTHER RESPIRATORY from 10/04/2020 in [place]  Date 09/06/20  Educator Handout      Nutrition for Pulmonary Patient:  -Group instruction provided by PowerPoint slides, verbal discussion, and written materials to support subject matter. The instructor gives an explanation and review of healthy diet recommendations, which includes a discussion  on weight management, recommendations for fruit and vegetable consumption, as well as protein, fluid, caffeine, fiber, sodium, sugar, and alcohol. Tips for eating when patients are short of breath are discussed. Flowsheet Row PULMONARY REHAB OTHER RESPIRATORY from 10/04/2020 in [place]  Date 09/20/20  Educator Meredith-Handout  Instruction Review Code 1- Verbalizes Understanding      Pursed Lip Breathing:  -Group instruction that is supported by demonstration and informational handouts. Instructor discusses the benefits of pursed lip and diaphragmatic breathing and detailed demonstration on how to preform both.     Oxygen Safety:  -Group instruction provided by PowerPoint, verbal discussion, and written material to support subject matter. There is an overview of "What is Oxygen" and "Why do we need it".  Instructor also reviews how to create a safe environment for oxygen use, the importance of using oxygen as prescribed, and the risks of noncompliance. There is a brief discussion on traveling with oxygen and resources the patient may utilize.   Oxygen Equipment:  -Group instruction provided by [facility] Staff utilizing handouts, written materials, and equipment demonstrations.   Signs and Symptoms:  -Group instruction provided by written material and verbal discussion to support subject matter. Warning signs and symptoms of infection, stroke, and heart attack are reviewed and when to call the physician/911 reinforced. Tips for preventing the spread of infection discussed.   Advanced Directives:  -Group instruction provided by verbal instruction and written material to support subject matter. Instructor reviews Advanced Directive laws and proper instruction for filling out document.   Pulmonary Video:  -Group video education that reviews the importance of medication and oxygen compliance, exercise, good nutrition, pulmonary hygiene, and pursed lip and  diaphragmatic breathing for the pulmonary patient.   Exercise for the Pulmonary Patient:  -Group instruction that is supported by  a PowerPoint presentation. Instructor discusses benefits of exercise, core components of exercise, frequency, duration, and intensity of an exercise routine, importance of utilizing pulse oximetry during exercise, safety while exercising, and options of places to exercise outside of rehab.   Flowsheet Row PULMONARY REHAB OTHER RESPIRATORY from 10/04/2020 in [place]  Date 10/04/20  Educator Handout  Instruction Review Code 1- Verbalizes Understanding      Pulmonary Medications:  -Verbally interactive group education provided by instructor with focus on inhaled medications and proper administration.   Anatomy and Physiology of the Respiratory System and Intimacy:  -Group instruction provided by PowerPoint, verbal discussion, and written material to support subject matter. Instructor reviews respiratory cycle and anatomical components of the respiratory system and their functions. Instructor also reviews differences in obstructive and restrictive respiratory diseases with examples of each. Intimacy, Sex, and Sexuality differences are reviewed with a discussion on how relationships can change when diagnosed with pulmonary disease. Common sexual concerns are reviewed.   MD DAY -A group question and answer session with a medical doctor that allows participants to ask questions that relate to their pulmonary disease state.   OTHER EDUCATION -Group or individual verbal, written, or video instructions that support the educational goals of the pulmonary rehab program.   Holiday Eating Survival Tips:  -Group instruction provided by PowerPoint slides, verbal discussion, and written materials to support subject matter. The instructor gives patients tips, tricks, and techniques to help them not only survive but enjoy the holidays despite the  onslaught of food that accompanies the holidays.   Knowledge Questionnaire Score:  Knowledge Questionnaire Score - 08/27/20 1218      Knowledge Questionnaire Score   Pre Score 16/18           Core Components/Risk Factors/Patient Goals at Admission:  Personal Goals and Risk Factors at Admission - 09/10/20 0907      Core Components/Risk Factors/Patient Goals on Admission   Improve shortness of breath with ADL's Yes    Intervention Provide education, individualized exercise plan and daily activity instruction to help decrease symptoms of SOB with activities of daily living.    Expected Outcomes Short Term: Improve cardiorespiratory fitness to achieve a reduction of symptoms when performing ADLs;Long Term: Be able to perform more ADLs without symptoms or delay the onset of symptoms           Core Components/Risk Factors/Patient Goals Review:   Goals and Risk Factor Review    Row Name 09/10/20 0908 10/08/20 1129           Core Components/Risk Factors/Patient Goals Review   Personal Goals Review Increase knowledge of respiratory medications and ability to use respiratory devices properly.;Tobacco Cessation;Improve shortness of breath with ADL's;Develop more efficient breathing techniques such as purse lipped breathing and diaphragmatic breathing and practicing self-pacing with activity. Increase knowledge of respiratory medications and ability to use respiratory devices properly.;Improve shortness of breath with ADL's;Develop more efficient breathing techniques such as purse lipped breathing and diaphragmatic breathing and practicing self-pacing with activity.      Review Kendall just started the program, he has attended 2 exercise sessions and it is too early to have met any goals.  Will continue to support him with smoking cessation and increase workloads as appropriate. Tanveer is progressing well, he is exercising at level 3 of the nustep and walking 12-14 laps on the track in 15 minutes.   He seems to be enjoying the exercise and has a positive outlook.  Expected Outcomes For Tray to select a quit date and strength and stamina will improve over the next full 30 days. For Armonte to continue to progress by increasing his exercise workloads as tolerated and that he continues to exercise on his own when discharged from pulmonary rehab.             Core Components/Risk Factors/Patient Goals at Discharge (Final Review):   Goals and Risk Factor Review - 10/08/20 1129      Core Components/Risk Factors/Patient Goals Review   Personal Goals Review Increase knowledge of respiratory medications and ability to use respiratory devices properly.;Improve shortness of breath with ADL's;Develop more efficient breathing techniques such as purse lipped breathing and diaphragmatic breathing and practicing self-pacing with activity.    Review Bostyn is progressing well, he is exercising at level 3 of the nustep and walking 12-14 laps on the track in 15 minutes.  He seems to be enjoying the exercise and has a positive outlook.    Expected Outcomes For Orlin to continue to progress by increasing his exercise workloads as tolerated and that he continues to exercise on his own when discharged from pulmonary rehab.           ITP Comments:   Comments: ITP REVIEW Pt is making expected progress toward pulmonary rehab goals after completing 7 sessions. Recommend continued exercise, life style modification, education, and utilization of breathing techniques to increase stamina and strength and decrease shortness of breath with exertion.

## 2020-10-09 NOTE — Progress Notes (Signed)
Daily Session Note  Patient Details  Name: Douglas Barrera MRN: [id number] Date of Birth: 01/17/1951 Referring Provider:   [name] Pulmonary Rehab Walk Test from 08/27/2020 in [place]  Referring Provider [name], DO      Encounter Date: 10/09/2020  Check In:  Session Check In - 10/09/20 1102      Check-In   Supervising physician immediately available to respond to emergencies [facility] immediately available    Physician(s) Dr. Tana Coast    Location [facility]    Staff Present Rosebud Poles, RN, Isaac Laud, MS, ACSM-CEP, Exercise Physiologist;Janyah Singleterry Ysidro Evert, RN    Virtual Visit No    Medication changes reported     No    Fall or balance concerns reported    No    Tobacco Cessation No Change    Warm-up and Cool-down Performed on first and last piece of equipment    Resistance Training Performed Yes    VAD Patient? No    PAD/SET Patient? No      VAD patient   Has back up controller? No    Has spare charged batteries? No    Has battery cables? No    Has compatible battery clips? No      Pain Assessment   Currently in Pain? No/denies    Multiple Pain Sites No           Capillary Blood Glucose: No results found for this or any previous visit (from the past 24 hour(s)).   Exercise Prescription Changes - 10/09/20 1100      Response to Exercise   Blood Pressure (Admit) 120/60    Blood Pressure (Exercise) 124/62    Blood Pressure (Exit) 94/50    Heart Rate (Admit) 86 bpm    Heart Rate (Exercise) 112 bpm    Heart Rate (Exit) 94 bpm    Oxygen Saturation (Admit) 97 %    Oxygen Saturation (Exercise) 93 %    Oxygen Saturation (Exit) 94 %    Rating of Perceived Exertion (Exercise) 12    Perceived Dyspnea (Exercise) 1    Duration Continue with 30 min of aerobic exercise without signs/symptoms of physical distress.    Intensity THRR unchanged      Progression   Progression Continue to progress workloads  to maintain intensity without signs/symptoms of physical distress.      Resistance Training   Training Prescription Yes    Weight orange bands    Reps 10-15    Time 10 Minutes      Oxygen   Oxygen Continuous    Liters 3      NuStep   Level 4    SPM 80    Minutes 15    METs 2.6      Track   Laps 12    Minutes 15           Social History   Tobacco Use  Smoking Status Former Smoker  . Packs/day: 2.00  . Years: 49.00  . Pack years: 98.00  . Types: Cigars, Cigarettes  . Start date: 10/17/1968  . Quit date: 08/18/2018  . Years since quitting: 2.1  Smokeless Tobacco Never Used  Tobacco Comment   quit smoking cigarettes in Dec 2019 but do backslide occasional, but smokes 3-4 cigars a week. small sigar on occasion 10/13/18    Goals Met:  Exercise tolerated well No report of cardiac concerns or symptoms Strength training completed today  Goals  Unmet:  Not Applicable  Comments: Service time is from 87 to 59    Dr. Fransico Him is Medical Director for Cardiac Rehab at [facility].

## 2020-10-10 ENCOUNTER — Telehealth (HOSPITAL_COMMUNITY): Payer: Self-pay | Admitting: Internal Medicine

## 2020-10-11 ENCOUNTER — Encounter (HOSPITAL_COMMUNITY): Payer: Medicare Other

## 2020-10-15 ENCOUNTER — Encounter: Payer: Medicare Other | Admitting: Internal Medicine

## 2020-10-16 ENCOUNTER — Ambulatory Visit (INDEPENDENT_AMBULATORY_CARE_PROVIDER_SITE_OTHER): Payer: Medicare Other | Admitting: Internal Medicine

## 2020-10-16 ENCOUNTER — Encounter: Payer: Self-pay | Admitting: Internal Medicine

## 2020-10-16 ENCOUNTER — Encounter (HOSPITAL_COMMUNITY): Payer: Medicare Other

## 2020-10-16 ENCOUNTER — Other Ambulatory Visit: Payer: Self-pay

## 2020-10-16 VITALS — BP 106/60 | HR 83 | Temp 97.2°F | Resp 16 | Ht 64.5 in | Wt 151.0 lb

## 2020-10-16 DIAGNOSIS — R7309 Other abnormal glucose: Secondary | ICD-10-CM

## 2020-10-16 DIAGNOSIS — Z125 Encounter for screening for malignant neoplasm of prostate: Secondary | ICD-10-CM | POA: Diagnosis not present

## 2020-10-16 DIAGNOSIS — I2510 Atherosclerotic heart disease of native coronary artery without angina pectoris: Secondary | ICD-10-CM | POA: Diagnosis not present

## 2020-10-16 DIAGNOSIS — I700 Atherosclerosis of aorta: Secondary | ICD-10-CM

## 2020-10-16 DIAGNOSIS — N138 Other obstructive and reflux uropathy: Secondary | ICD-10-CM | POA: Diagnosis not present

## 2020-10-16 DIAGNOSIS — E559 Vitamin D deficiency, unspecified: Secondary | ICD-10-CM | POA: Diagnosis not present

## 2020-10-16 DIAGNOSIS — Z79899 Other long term (current) drug therapy: Secondary | ICD-10-CM

## 2020-10-16 DIAGNOSIS — N401 Enlarged prostate with lower urinary tract symptoms: Secondary | ICD-10-CM | POA: Diagnosis not present

## 2020-10-16 DIAGNOSIS — Z1211 Encounter for screening for malignant neoplasm of colon: Secondary | ICD-10-CM

## 2020-10-16 DIAGNOSIS — F17200 Nicotine dependence, unspecified, uncomplicated: Secondary | ICD-10-CM | POA: Diagnosis not present

## 2020-10-16 DIAGNOSIS — E782 Mixed hyperlipidemia: Secondary | ICD-10-CM

## 2020-10-16 DIAGNOSIS — Z87891 Personal history of nicotine dependence: Secondary | ICD-10-CM | POA: Diagnosis not present

## 2020-10-16 DIAGNOSIS — R0989 Other specified symptoms and signs involving the circulatory and respiratory systems: Secondary | ICD-10-CM | POA: Diagnosis not present

## 2020-10-16 DIAGNOSIS — Z136 Encounter for screening for cardiovascular disorders: Secondary | ICD-10-CM

## 2020-10-16 DIAGNOSIS — J9611 Chronic respiratory failure with hypoxia: Secondary | ICD-10-CM

## 2020-10-16 DIAGNOSIS — J449 Chronic obstructive pulmonary disease, unspecified: Secondary | ICD-10-CM

## 2020-10-16 DIAGNOSIS — Z1212 Encounter for screening for malignant neoplasm of rectum: Secondary | ICD-10-CM

## 2020-10-16 NOTE — Progress Notes (Signed)
Comprehensive Evaluation & Examination      This very nice 70 y.o.  MWM presents for a  comprehensive evaluation and management of multiple medical co-morbidities.  Patient has been followed for HTN, HLD,  COPD,   Prediabetes and Vitamin D Deficiency.      Patient has severe O2 Dependent COPD  At 3 lit /min  sat'ing at about 94%  (with native O2's off oxygen at 86-88% with activity). Patient is on Pulmonary Rehab. Patient  is followed by Dr Valeta Harms      Patient has hx/o labile HTN predates since 27. Patient's BP has been controlled and today's BP is at goal - 106/60. Patient denies any cardiac symptoms as chest pain, palpitations, shortness of breath, dizziness or ankle swelling.      Patient's hyperlipidemia is not controlled with diet and 3 x /weekly Rosuvastatin. Patient denies myalgias or other medication SE's. Last lipids were not at goal:  Lab Results  Component Value Date   CHOL 175 07/06/2020   HDL 47 07/06/2020   LDLCALC 109 (H) 07/06/2020   TRIG 97 07/06/2020   CHOLHDL 3.7 07/06/2020        Patient has hx/o prediabetes (A1c 6.0% /2010) and patient denies reactive hypoglycemic symptoms, visual blurring, diabetic polys or paresthesias. Last A1c was not at goal:   Lab Results  Component Value Date   HGBA1C 6.0 (H) 04/03/2020        Finally, patient has history of Vitamin D Deficiency ("24" /2008) and last vitamin D was at goal:   Lab Results  Component Value Date   VD25OH 73 04/03/2020    Current Outpatient Medications on File Prior to Visit  Medication Sig  . albuterol HFA  inhaler Inhale 1 to 2 puffs 4 x day or every 4 hours to rescue asthma  . aspirin 81 MG tablet Take very evening.   . budesonide  0.5 MG/2ML neb soln Take 2 mLs  by neb 2  times daily.  Marland Kitchen VITAMIN D 5000 units CAPS Take 10,000 Units  daily.   . diphenhydrAMINE  25 MG tablet Take 50 mg  daily.   Marland Kitchen FLONASE  nasal spray Place 2 sprays into both nostrils daily  . PERFOROMIST 20 MCG/2ML neb soln  Use 2 mls by Inhalation 2 x /day  . guaifenesin  400 MG TABS tablet Take 800 mg   daily.   . ATROVENT 0.06 % nasal spray Use 1 to 2 sprays each nostril 2 to 3 x /day as needed  . YUPELRI 175 MCG/3ML neb soln Take 3 mLs (175 mcg total) by nebulization daily.  . rosuvastatin  5 MG tablet Take 1 tab three days a week (WMF)   . traZODone  150 MG tablet TAKE 1 TABLET 1 HR BEFORE BEDTIME   . ASPERCREME 10 % cream Apply topically as needed for muscle pain.  Marland Kitchen zinc  50 MG tablet Take  daily.    Allergies  Allergen Reactions  . Niacin And Related Other (See Comments)    Unknown Childhood reaction.    Past Medical History:  Diagnosis Date  . COPD (chronic obstructive pulmonary disease) ([facility])   . GERD (gastroesophageal reflux disease)   . History of elevated lipids   . Hyperlipidemia   . IBS (irritable bowel syndrome)   . Labile hypertension    patient is not on meds for hypertension  . Prediabetes   . Secondary polycythemia 05/25/2018  . Tubular adenoma of colon   . Vitamin D deficiency  Health Maintenance  Topic Date Due  . TETANUS/TDAP  12/28/2020 (Originally 09/02/2019)  . COVID-19 Vaccine (4 - Booster for Pfizer series) 12/18/2020  . INFLUENZA VACCINE  Completed  . Hepatitis C Screening  Completed  . PNA vac Low Risk Adult  Completed   Immunization History  Administered Date(s) Administered  . Fluad Quad(high Dose 65+) 06/13/2020  . Influenza Split  06/20/2014, 06/28/2015  . Influenza, High Dose Seasonal PF  05/24/2018, 06/14/2018, 06/29/2019  . Influenza,inj,quad, With Preservative 07/21/2016  . Influenza-Unspecified 06/17/2017  . PFIZER(Purple Top)SARS-COV-2 Vaccination 09/26/2019, 10/17/2019, 06/19/2020  . PPD Test 06/20/2014, 06/28/2015, 07/21/2016  . Pneumococcal Conjugate-13 06/20/2014  . Pneumococcal Polysaccharide-23 04/30/2009, 08/12/2017  . Tdap 09/01/2009   Last Colon - 10/26/2018 - Dr Fuller Plan - Recc - 5 year f/u due Mar 2025  Past Surgical History:  Procedure  Laterality Date  . BASAL CELL CARCINOMA EXCISION  2008   Left [place]  . COLONOSCOPY WITH PROPOFOL N/A 10/26/2018   Procedure: COLONOSCOPY WITH PROPOFOL;  Surgeon: Ladene Artist, MD;  Location: [facility];  Service: Endoscopy;  Laterality: N/A;  . TESTICLE SURGERY     testicle removed   Family History  Problem Relation Age of Onset  . Breast cancer Mother   . Stomach cancer Mother    Social History   Socioeconomic History  . Marital status: Married    Spouse name: [place] (Val)   . Number of children: 0  Occupational History  . Occupation: retired  Tobacco Use  . Smoking status: Former Smoker    Packs/day: 2.00    Years: 49.00    Pack years: 98.00    Types: Cigars, Cigarettes    Start date: 10/17/1968    Quit date: 08/18/2018    Years since quitting: 2.1  . Smokeless tobacco: Never Used  . Tobacco comment: quit smoking cigarettes in Dec 2019 but do backslide occasional, but smokes 3-4 cigars a week. small cigar on occasion 10/13/18  Substance and Sexual Activity  . Alcohol use: No  . Drug use: No  . Sexual activity: Not on file   ROS Constitutional: Denies fever, chills, weight loss/gain, headaches, insomnia,  night sweats or change in appetite. Does c/o fatigue. Eyes: Denies redness, blurred vision, diplopia, discharge, itchy or watery eyes.  ENT: Denies discharge, congestion, post nasal drip, epistaxis, sore throat, earache, hearing loss, dental pain, Tinnitus, Vertigo, Sinus pain or snoring.  Cardio: Denies chest pain, palpitations, irregular heartbeat, syncope, dyspnea, diaphoresis, orthopnea, PND, claudication or edema Respiratory: denies cough, dyspnea, DOE, pleurisy, hoarseness, laryngitis or wheezing.  Gastrointestinal: Denies dysphagia, heartburn, reflux, water brash, pain, cramps, nausea, vomiting, bloating, diarrhea, constipation, hematemesis, melena, hematochezia, jaundice or hemorrhoids Genitourinary: Denies dysuria, frequency, discharge, hematuria or flank  pain. Has urgency, nocturia x 2-3 & occasional hesitancy. Musculoskeletal: Denies arthralgia, myalgia, stiffness, Jt. Swelling, pain, limp or strain/sprain. Denies Falls. Skin: Denies puritis, rash, hives, warts, acne, eczema or change in skin lesion Neuro: No weakness, tremor, incoordination, spasms, paresthesia or pain Psychiatric: Denies confusion, memory loss or sensory loss. Denies Depression. Endocrine: Denies change in weight, skin, hair change, nocturia, and paresthesia, diabetic polys, visual blurring or hyper / hypo glycemic episodes.  Heme/Lymph: No excessive bleeding, bruising or enlarged lymph nodes.  Physical Exam  BP 106/60   Pulse 83   Temp (!) 97.2 F (36.2 C)   Resp 16   Ht 5' 4.5" (1.638 m)   Wt 151 lb (68.5 kg)   SpO2 94% Comment: on 3 L of O2  BMI 25.52  kg/m   General Appearance: Well nourished and well groomed and in no apparent distress.  Eyes: PERRLA, EOMs, conjunctiva no swelling or erythema, normal fundi and vessels. Sinuses: No frontal/maxillary tenderness ENT/Mouth: EACs patent / TMs  nl. Nares clear without erythema, swelling, mucoid exudates. Oral hygiene is good. No erythema, swelling, or exudate. Tongue normal, non-obstructing. Tonsils not swollen or erythematous. Hearing normal.  Neck: Supple, thyroid not palpable. No bruits, nodes or JVD. Respiratory: Respiratory effort normal.  BS equal and clear bilateral without rales, rhonci, wheezing or stridor. Cardio: Heart sounds are normal with regular rate and rhythm and no murmurs, rubs or gallops. Peripheral pulses are normal and equal bilaterally without edema. No aortic or femoral bruits. Chest: symmetric with normal excursions and percussion.  Abdomen: Soft, with Nl bowel sounds. Nontender, no guarding, rebound, hernias, masses, or organomegaly.  Lymphatics: Non tender without lymphadenopathy.  Musculoskeletal: Full ROM all peripheral extremities, joint stability, 5/5 strength, and normal gait. Skin:  Warm and dry without rashes, lesions, cyanosis, clubbing or  ecchymosis.  Neuro: Cranial nerves intact, reflexes equal bilaterally. Normal muscle tone, no cerebellar symptoms. Sensation intact.  Pysch: Alert and oriented X 3 with normal affect, insight and judgment appropriate.   Assessment and Plan   1. Labile hypertension  - EKG 12-Lead - [place], RETROPERITNL ABD,  LTD - CBC with Differential/Platelet - COMPLETE METABOLIC PANEL WITH GFR - Magnesium - TSH  2. Hyperlipidemia, mixed  - EKG 12-Lead - [place], RETROPERITNL ABD,  LTD - Lipid panel - TSH  3. Abnormal glucose  - EKG 12-Lead - [place], RETROPERITNL ABD,  LTD - Hemoglobin A1c - Insulin, random  4. Vitamin D deficiency  - VITAMIN D 25 Hydroxy  5. Atherosclerosis of native coronary artery of  native heart without angina pectoris  - EKG 12-Lead - [place], RETROPERITNL ABD,  LTD - Lipid panel  6. Aortic atherosclerosis ([place]) by Chest CTscan on 11/01/2019  - EKG 12-Lead - [place], RETROPERITNL ABD,  LTD - Lipid panel  7. 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[place])   8. Chronic hypoxemic respiratory failure (H[facility])   9. BPH with obstruction/lower urinary tract symptoms  - PSA  10. Screening for colorectal cancer  - POC Hemoccult Bld/Stl   11. Prostate cancer screening  - PSA  12. Screening for ischemic heart disease  - EKG 12-Lead  13. Former smoker  - EKG 12-Lead - [place], RETROPERITNL ABD,  LTD  14. Screening for AAA (aortic abdominal aneurysm)  - [place], RETROPERITNL ABD,  LTD  15. Medication management  - Urinalysis, Routine w reflex microscopic - Microalbumin / creatinine urine ratio - CBC with Differential/Platelet - COMPLETE METABOLIC PANEL WITH GFR - Magnesium - Lipid panel - TSH - Hemoglobin A1c - Insulin, random - VITAMIN D 25 Hydroxy        Patient was counseled in prudent diet, weight control to achieve/maintain BMI less than 25, BP monitoring, regular exercise and medications as discussed.  Discussed med effects  and SE's. Routine screening labs and tests as requested with regular follow-up as recommended. Over 40 minutes of exam, counseling, chart review and high complex critical decision making was performed   Kirtland Bouchard, MD

## 2020-10-16 NOTE — Progress Notes (Signed)
AortaScan negative. Per Dr Melford Aase.

## 2020-10-16 NOTE — Patient Instructions (Signed)

## 2020-10-17 LAB — VITAMIN D 25 HYDROXY (VIT D DEFICIENCY, FRACTURES): Vit D, 25-Hydroxy: 78 ng/mL (ref 30–100)

## 2020-10-17 LAB — CBC WITH DIFFERENTIAL/PLATELET
Absolute Monocytes: 770 cells/uL (ref 200–950)
Basophils Absolute: 28 cells/uL (ref 0–200)
Basophils Relative: 0.4 %
Eosinophils Absolute: 70 cells/uL (ref 15–500)
Eosinophils Relative: 1 %
HCT: 48.8 % (ref 38.5–50.0)
Hemoglobin: 16.6 g/dL (ref 13.2–17.1)
Lymphs Abs: 1666 cells/uL (ref 850–3900)
MCH: 31.4 pg (ref 27.0–33.0)
MCHC: 34 g/dL (ref 32.0–36.0)
MCV: 92.4 fL (ref 80.0–100.0)
MPV: 10.7 fL (ref 7.5–12.5)
Monocytes Relative: 11 %
Neutro Abs: 4466 cells/uL (ref 1500–7800)
Neutrophils Relative %: 63.8 %
Platelets: 206 10*3/uL (ref 140–400)
RBC: 5.28 10*6/uL (ref 4.20–5.80)
RDW: 11.6 % (ref 11.0–15.0)
Total Lymphocyte: 23.8 %
WBC: 7 10*3/uL (ref 3.8–10.8)

## 2020-10-17 LAB — TSH: TSH: 2.14 mIU/L (ref 0.40–4.50)

## 2020-10-17 LAB — COMPLETE METABOLIC PANEL WITH GFR
AG Ratio: 1.8 (calc) (ref 1.0–2.5)
ALT: 18 U/L (ref 9–46)
AST: 17 U/L (ref 10–35)
Albumin: 4.6 g/dL (ref 3.6–5.1)
Alkaline phosphatase (APISO): 80 U/L (ref 35–144)
BUN: 17 mg/dL (ref 7–25)
CO2: 31 mmol/L (ref 20–32)
Calcium: 10 mg/dL (ref 8.6–10.3)
Chloride: 99 mmol/L (ref 98–110)
Creat: 0.94 mg/dL (ref 0.70–1.25)
GFR, Est African American: 96 mL/min/{1.73_m2} (ref >=60)
GFR, Est Non African American: 83 mL/min/{1.73_m2} (ref >=60)
Globulin: 2.6 g/dL (calc) (ref 1.9–3.7)
Glucose, Bld: 85 mg/dL (ref 65–99)
Potassium: 4.9 mmol/L (ref 3.5–5.3)
Sodium: 140 mmol/L (ref 135–146)
Total Bilirubin: 0.5 mg/dL (ref 0.2–1.2)
Total Protein: 7.2 g/dL (ref 6.1–8.1)

## 2020-10-17 LAB — URINALYSIS, ROUTINE W REFLEX MICROSCOPIC
Bacteria, UA: NONE SEEN /HPF
Bilirubin Urine: NEGATIVE
Glucose, UA: NEGATIVE
Hgb urine dipstick: NEGATIVE
Hyaline Cast: NONE SEEN /LPF
Ketones, ur: NEGATIVE
Nitrite: NEGATIVE
Protein, ur: NEGATIVE
RBC / HPF: NONE SEEN /HPF (ref 0–2)
Specific Gravity, Urine: 1.01 (ref 1.001–1.03)
Squamous Epithelial / HPF: NONE SEEN /HPF (ref <=5)
WBC, UA: NONE SEEN /HPF (ref 0–5)
pH: 7 (ref 5.0–8.0)

## 2020-10-17 LAB — LIPID PANEL
Cholesterol: 140 mg/dL (ref <200)
HDL: 46 mg/dL (ref >=40)
LDL Cholesterol (Calc): 75 mg/dL (calc)
Non-HDL Cholesterol (Calc): 94 mg/dL (calc) (ref <130)
Total CHOL/HDL Ratio: 3 (calc) (ref <5.0)
Triglycerides: 111 mg/dL (ref <150)

## 2020-10-17 LAB — HEMOGLOBIN A1C
Hgb A1c MFr Bld: 5.8 % of total Hgb — ABNORMAL HIGH (ref <5.7)
Mean Plasma Glucose: 120 mg/dL
eAG (mmol/L): 6.6 mmol/L

## 2020-10-17 LAB — INSULIN, RANDOM: Insulin: 19.1 u[IU]/mL

## 2020-10-17 LAB — MAGNESIUM: Magnesium: 2.1 mg/dL (ref 1.5–2.5)

## 2020-10-17 LAB — PSA: PSA: 0.76 ng/mL (ref <=4.0)

## 2020-10-17 LAB — MICROALBUMIN / CREATININE URINE RATIO
Creatinine, Urine: 44 mg/dL (ref 20–320)
Microalb Creat Ratio: 5 mcg/mg creat (ref <30)
Microalb, Ur: 0.2 mg/dL

## 2020-10-17 NOTE — Progress Notes (Signed)
========================================================== -   Test results slightly outside the reference range are not unusual. If there is anything important, I will review this with you,  otherwise it is considered normal test values.  If you have further questions,  please do not hesitate to contact me at the office or via My Chart.  ========================================================== ==========================================================  -  PSA - Very Low - Great ! ========================================================== ==========================================================  -  Total Chol = 140  and LDL Chol = 75 - Both  Excellent   - Very low risk for Heart Attack  / Stroke ========================================================  - A1c = 5.8% - getting closer back to Normal Range ========================================================== ==========================================================  -  All Else - CBC - Kidneys - Electrolytes - Liver - Magnesium & Thyroid    - all  Normal / OK ========================================================== ==========================================================  - Keep up the [place] Work  !  ========================================================== ==========================================================

## 2020-10-18 ENCOUNTER — Other Ambulatory Visit: Payer: Self-pay

## 2020-10-18 ENCOUNTER — Encounter (HOSPITAL_COMMUNITY)
Admission: RE | Admit: 2020-10-18 | Discharge: 2020-10-18 | Disposition: A | Discharge status: Home / Self Care | Payer: Medicare Other | Source: Ambulatory Visit | Attending: Pulmonary Disease | Admitting: Pulmonary Disease

## 2020-10-18 VITALS — Wt 148.1 lb

## 2020-10-18 DIAGNOSIS — Z79899 Other long term (current) drug therapy: Secondary | ICD-10-CM | POA: Diagnosis not present

## 2020-10-18 DIAGNOSIS — Z87891 Personal history of nicotine dependence: Secondary | ICD-10-CM | POA: Diagnosis not present

## 2020-10-18 DIAGNOSIS — J9611 Chronic respiratory failure with hypoxia: Secondary | ICD-10-CM | POA: Diagnosis not present

## 2020-10-18 NOTE — Progress Notes (Signed)
Daily Session Note  Patient Details  Name: Douglas Barrera MRN: [id number] Date of Birth: 1950/10/16 Referring Provider:   [name] Pulmonary Rehab Walk Test from 08/27/2020 in [place]  Referring Provider [name], DO      Encounter Date: 10/18/2020  Check In:  Session Check In - 10/18/20 1031      Check-In   Supervising physician immediately available to respond to emergencies [facility] immediately available    Physician(s) Dr. Sloan Leiter    Location [facility]    Staff Present Rosebud Poles, RN, BSN;Lisa Ysidro Evert, RN;Jessica Hassell Done, MS, ACSM-CEP, Exercise Physiologist    Virtual Visit No    Medication changes reported     No    Fall or balance concerns reported    No    Tobacco Cessation No Change    Warm-up and Cool-down Performed on first and last piece of equipment    Resistance Training Performed Yes    VAD Patient? No    PAD/SET Patient? No      Pain Assessment   Multiple Pain Sites No           Capillary Blood Glucose: No results found for this or any previous visit (from the past 24 hour(s)).    Social History   Tobacco Use  Smoking Status Former Smoker  . Packs/day: 2.00  . Years: 49.00  . Pack years: 98.00  . Types: Cigars, Cigarettes  . Start date: 10/17/1968  . Quit date: 08/18/2018  . Years since quitting: 2.1  Smokeless Tobacco Never Used  Tobacco Comment   quit smoking cigarettes in Dec 2019 but do backslide occasional, but smokes 3-4 cigars a week. small sigar on occasion 10/13/18    Goals Met:  Proper associated with RPD/PD & O2 Sat Exercise tolerated well Strength training completed today  Goals Unmet:  Not Applicable  Comments: Service time is from 0945 to 1107    Dr. Fransico Him is Medical Director for Cardiac Rehab at [facility].

## 2020-10-23 ENCOUNTER — Encounter (HOSPITAL_COMMUNITY): Payer: Medicare Other

## 2020-10-25 ENCOUNTER — Encounter (HOSPITAL_COMMUNITY): Payer: Medicare Other

## 2020-10-30 ENCOUNTER — Encounter (HOSPITAL_COMMUNITY)
Admission: RE | Admit: 2020-10-30 | Discharge: 2020-10-30 | Disposition: A | Discharge status: Home / Self Care | Payer: Medicare Other | Source: Ambulatory Visit | Attending: Pulmonary Disease | Admitting: Pulmonary Disease

## 2020-10-30 ENCOUNTER — Other Ambulatory Visit: Payer: Self-pay

## 2020-10-30 DIAGNOSIS — J9611 Chronic respiratory failure with hypoxia: Secondary | ICD-10-CM | POA: Insufficient documentation

## 2020-10-30 NOTE — Progress Notes (Signed)
Daily Session Note  Patient Details  Name: Douglas Barrera MRN: [id number] Date of Birth: Sep 21, 1950 Referring Provider:   [name] Pulmonary Rehab Walk Test from 08/27/2020 in [place]  Referring Provider [name], DO      Encounter Date: 10/30/2020  Check In:  Session Check In - 10/30/20 1009      Check-In   Supervising physician immediately available to respond to emergencies [facility] immediately available    Physician(s) Dr. Venetia Constable    Location [facility]    Staff Present Rosebud Poles, RN, BSN;Lisa Ysidro Evert, RN;Carlette Wilber Oliphant, RN, Isaac Laud, MS, ACSM-CEP, Exercise Physiologist    Virtual Visit No    Medication changes reported     No    Fall or balance concerns reported    No    Tobacco Cessation No Change    Warm-up and Cool-down Performed on first and last piece of equipment    Resistance Training Performed Yes    VAD Patient? No    PAD/SET Patient? No      Pain Assessment   Currently in Pain? No/denies    Multiple Pain Sites No           Capillary Blood Glucose: No results found for this or any previous visit (from the past 24 hour(s)).    Social History   Tobacco Use  Smoking Status Former Smoker  . Packs/day: 2.00  . Years: 49.00  . Pack years: 98.00  . Types: Cigars, Cigarettes  . Start date: 10/17/1968  . Quit date: 08/18/2018  . Years since quitting: 2.2  Smokeless Tobacco Never Used  Tobacco Comment   quit smoking cigarettes in Dec 2019 but do backslide occasional, but smokes 3-4 cigars a week. small sigar on occasion 10/13/18    Goals Met:  Exercise tolerated well No report of cardiac concerns or symptoms Strength training completed today  Goals Unmet:  Not Applicable  Comments: Service time is from 0945 to 1058    Douglas Barrera is Medical Director for Cardiac Rehab at [facility].

## 2020-10-30 NOTE — Progress Notes (Signed)
Pulmonary Individual Treatment Plan  Patient Details  Name: Douglas Barrera MRN: [id number] Date of Birth: 1951/04/04 Referring Provider:   [name] Pulmonary Rehab Walk Test from 08/27/2020 in [facility]  Referring Provider [name], DO      Initial Encounter Date:  Flowsheet Row Pulmonary Rehab Walk Test from 08/27/2020 in [facility]  Date 08/27/20      Visit Diagnosis: Chronic hypoxemic respiratory failure ([place])  Patient's Home Medications on Admission:   Current Outpatient Medications:  .  albuterol (VENTOLIN HFA) 108 (90 Base) MCG/ACT inhaler, Inhale 1 to 2 puffs 4 x day or every 4 hours to rescue asthma, Disp: 18 g, Rfl: 1 .  aspirin 81 MG tablet, Take 81 mg by mouth every evening. , Disp: , Rfl:  .  budesonide (PULMICORT) 0.5 MG/2ML nebulizer solution, Take 2 mLs (0.5 mg total) by nebulization 2 (two) times daily., Disp: 120 mL, Rfl: 11 .  Cholecalciferol (VITAMIN D3) 5000 units CAPS, Take 10,000 Units by mouth daily. , Disp: , Rfl:  .  diphenhydrAMINE (BENADRYL) 25 MG tablet, Take 50 mg by mouth daily. , Disp: , Rfl:  .  fluticasone (FLONASE) 50 MCG/ACT nasal spray, Place 2 sprays into both nostrils daily. (Patient taking differently: Place 2 sprays into both nostrils every evening.), Disp: 48 g, Rfl: 3 .  formoterol (PERFOROMIST) 20 MCG/2ML nebulizer solution, Use 2 mls by Inhalation 2 x /day, Disp: 360 mL, Rfl: 2 .  guaifenesin (HUMIBID E) 400 MG TABS tablet, Take 800 mg by mouth daily. , Disp: , Rfl:  .  revefenacin (YUPELRI) 175 MCG/3ML nebulizer solution, Take 3 mLs (175 mcg total) by nebulization daily., Disp: 90 mL, Rfl: 11 .  rosuvastatin (CRESTOR) 5 MG tablet, Take 1 tab three days a week (WMF) in the evening for cholesterol., Disp: 38 tablet, Rfl: 3 .  traZODone (DESYREL) 150 MG tablet, TAKE 1 TABLET 1 HOUR BEFORE BEDTIME AS NEEDED FOR SLEEP, Disp: 90 tablet, Rfl: 3 .  trolamine salicylate  (ASPERCREME) 10 % cream, Apply 1 application topically as needed for muscle pain., Disp: , Rfl:  .  zinc gluconate 50 MG tablet, Take 50 mg by mouth daily., Disp: , Rfl:   Past Medical History: Past Medical History:  Diagnosis Date  . COPD (chronic obstructive pulmonary disease) ([place])   . GERD (gastroesophageal reflux disease)   . History of elevated lipids   . Hyperlipidemia   . IBS (irritable bowel syndrome)   . Labile hypertension    patient is not on meds for hypertension  . Prediabetes   . Secondary polycythemia 05/25/2018  . Tubular adenoma of colon   . Vitamin D deficiency     Tobacco Use: Social History   Tobacco Use  Smoking Status Former Smoker  . Packs/day: 2.00  . Years: 49.00  . Pack years: 98.00  . Types: Cigars, Cigarettes  . Start date: 10/17/1968  . Quit date: 08/18/2018  . Years since quitting: 2.2  Smokeless Tobacco Never Used  Tobacco Comment   quit smoking cigarettes in Dec 2019 but do backslide occasional, but smokes 3-4 cigars a week. small sigar on occasion 10/13/18    Labs: Recent Review Flowsheet Data    Labs for ITP Cardiac and Pulmonary Rehab Latest Ref Rng & Units 09/29/2019 12/29/2019 04/03/2020 07/06/2020 10/16/2020   Cholestrol <200 mg/dL 166 176 173 175 140   LDLCALC mg/dL (calc) 98 107(H) 106(H) 109(H) 75   HDL > OR =  40 mg/dL 44 43 46 47 46   Trlycerides <150 mg/dL 142 150(H) 114 97 111   Hemoglobin A1c <5.7 % of total Hgb 5.8(H) - 6.0(H) - 5.8(H)   PHART 7.350 - 7.450 - - - - -   PCO2ART 32.0 - 48.0 mmHg - - - - -   HCO3 20.0 - 28.0 mmol/L - - - - -   O2SAT % - - - - -      Capillary Blood Glucose: No results found for: GLUCAP   Pulmonary Assessment Scores:  Pulmonary Assessment Scores    Row Name 08/27/20 1118         ADL UCSD   ADL Phase Entry     SOB Score total 37           CAT Score   CAT Score 16           mMRC Score   mMRC Score 1           UCSD: Self-administered rating of dyspnea associated with  activities of daily living (ADLs) 6-point scale (0 = "not at all" to 5 = "maximal or unable to do because of breathlessness")  Scoring Scores range from 0 to 120.  Minimally important difference is 5 units  CAT: CAT can identify the health impairment of COPD patients and is better correlated with disease progression.  CAT has a scoring range of zero to 40. The CAT score is classified into four groups of low (less than 10), medium (10 - 20), high (21-30) and very high (31-40) based on the impact level of disease on health status. A CAT score over 10 suggests significant symptoms.  A worsening CAT score could be explained by an exacerbation, poor medication adherence, poor inhaler technique, or progression of COPD or comorbid conditions.  CAT MCID is 2 points  mMRC: mMRC (Modified Medical Research Council) Dyspnea Scale is used to assess the degree of baseline functional disability in patients of respiratory disease due to dyspnea. No minimal important difference is established. A decrease in score of 1 point or greater is considered a positive change.   Pulmonary Function Assessment:  Pulmonary Function Assessment - 08/27/20 1105      Breath   Bilateral Breath Sounds Clear;Decreased    Shortness of Breath No           Exercise Target Goals: Exercise Program Goal: Individual exercise prescription set using results from initial 6 min walk test and THRR while considering  patient's activity barriers and safety.   Exercise Prescription Goal: Initial exercise prescription builds to 30-45 minutes a day of aerobic activity, 2-3 days per week.  Home exercise guidelines will be given to patient during program as part of exercise prescription that the participant will acknowledge.  Activity Barriers & Risk Stratification:  Activity Barriers & Cardiac Risk Stratification - 08/27/20 1055      Activity Barriers & Cardiac Risk Stratification   Activity Barriers Deconditioning;Muscular  Weakness;Shortness of Breath           6 Minute Walk:  6 Minute Walk    Row Name 08/27/20 1122         6 Minute Walk   Phase Initial     Distance 1140 feet     Walk Time 6 minutes     # of Rest Breaks 0     METS 2.16     RPE 11     Perceived Dyspnea  1     VO2 Peak 9.48  Symptoms Yes (comment)     Comments SOB, RPD = 1. No pain or other complaints noted.     Resting HR 68 bpm     Resting BP 98/60     Resting Oxygen Saturation  98 %     Exercise Oxygen Saturation  during 6 min walk 94 %     Max Ex. HR 107 bpm     Max Ex. BP 114/60     2 Minute Post BP 110/64           Interval HR   1 Minute HR 99     2 Minute HR 104     3 Minute HR 107     4 Minute HR 105     5 Minute HR 107     6 Minute HR 107     2 Minute Post HR 81     Interval Heart Rate? Yes           Interval Oxygen   Interval Oxygen? Yes     1 Minute Oxygen Saturation % 98 %     1 Minute Liters of Oxygen 3 L     2 Minute Oxygen Saturation % 96 %     2 Minute Liters of Oxygen 3 L     3 Minute Oxygen Saturation % 95 %     3 Minute Liters of Oxygen 3 L     4 Minute Oxygen Saturation % 94 %     4 Minute Liters of Oxygen 3 L     5 Minute Oxygen Saturation % 94 %     6 Minute Oxygen Saturation % 94 %     6 Minute Liters of Oxygen 3 L     2 Minute Post Oxygen Saturation % 97 %     2 Minute Post Liters of Oxygen 3 L            Oxygen Initial Assessment:  Oxygen Initial Assessment - 08/27/20 1103      Home Oxygen   Home Oxygen Device E-Tanks;Home Concentrator    Sleep Oxygen Prescription Continuous    Liters per minute 2.5    Home Exercise Oxygen Prescription Pulsed    Liters per minute 3    Home Resting Oxygen Prescription Continuous    Liters per minute 2.5    Compliance with Home Oxygen Use Yes      Initial 6 min Walk   Oxygen Used Continuous    Liters per minute 3      Program Oxygen Prescription   Program Oxygen Prescription Continuous    Liters per minute 3      Intervention    Short Term Goals To learn and exhibit compliance with exercise, home and travel O2 prescription;To learn and understand importance of monitoring SPO2 with pulse oximeter and demonstrate accurate use of the pulse oximeter.;To learn and understand importance of maintaining oxygen saturations>88%;To learn and demonstrate proper pursed lip breathing techniques or other breathing techniques.;To learn and demonstrate proper use of respiratory medications    Long  Term Goals Exhibits compliance with exercise, home and travel O2 prescription;Verbalizes importance of monitoring SPO2 with pulse oximeter and return demonstration;Maintenance of O2 saturations>88%;Exhibits proper breathing techniques, such as pursed lip breathing or other method taught during program session;Demonstrates proper use of MDI's;Compliance with respiratory medication           Oxygen Re-Evaluation:  Oxygen Re-Evaluation    Row Name 09/11/20 0709 10/09/20 0849 10/29/20 1003  Program Oxygen Prescription   Program Oxygen Prescription Continuous Continuous Continuous     Liters per minute 3 3 3            Home Oxygen   Home Oxygen Device E-Tanks;Home Concentrator E-Tanks;Home Concentrator E-Tanks;Home Concentrator     Sleep Oxygen Prescription Continuous Continuous Continuous     Liters per minute 2.5 2.5 2.5     Home Exercise Oxygen Prescription Pulsed Pulsed Pulsed     Liters per minute 3 3 3      Home Resting Oxygen Prescription Continuous Pulsed Pulsed     Liters per minute 2.5 2.5 2.5     Compliance with Home Oxygen Use Yes Yes Yes           Goals/Expected Outcomes   Short Term Goals To learn and exhibit compliance with exercise, home and travel O2 prescription;To learn and understand importance of monitoring SPO2 with pulse oximeter and demonstrate accurate use of the pulse oximeter.;To learn and understand importance of maintaining oxygen saturations>88%;To learn and demonstrate proper pursed lip breathing  techniques or other breathing techniques.;To learn and demonstrate proper use of respiratory medications To learn and exhibit compliance with exercise, home and travel O2 prescription;To learn and understand importance of monitoring SPO2 with pulse oximeter and demonstrate accurate use of the pulse oximeter.;To learn and understand importance of maintaining oxygen saturations>88%;To learn and demonstrate proper pursed lip breathing techniques or other breathing techniques.;To learn and demonstrate proper use of respiratory medications To learn and exhibit compliance with exercise, home and travel O2 prescription;To learn and understand importance of monitoring SPO2 with pulse oximeter and demonstrate accurate use of the pulse oximeter.;To learn and understand importance of maintaining oxygen saturations>88%;To learn and demonstrate proper pursed lip breathing techniques or other breathing techniques.;To learn and demonstrate proper use of respiratory medications     Long  Term Goals Exhibits compliance with exercise, home and travel O2 prescription;Verbalizes importance of monitoring SPO2 with pulse oximeter and return demonstration;Maintenance of O2 saturations>88%;Exhibits proper breathing techniques, such as pursed lip breathing or other method taught during program session;Demonstrates proper use of MDI's;Compliance with respiratory medication Exhibits compliance with exercise, home and travel O2 prescription;Verbalizes importance of monitoring SPO2 with pulse oximeter and return demonstration;Maintenance of O2 saturations>88%;Exhibits proper breathing techniques, such as pursed lip breathing or other method taught during program session;Demonstrates proper use of MDI's;Compliance with respiratory medication Exhibits compliance with exercise, home and travel O2 prescription;Verbalizes importance of monitoring SPO2 with pulse oximeter and return demonstration;Maintenance of O2 saturations>88%;Exhibits  proper breathing techniques, such as pursed lip breathing or other method taught during program session;Demonstrates proper use of MDI's;Compliance with respiratory medication     Comments - - Patient is compliant with supplemental oxygen prescription and is also compliant with monitoring his oxygen saturation at home. Pt could improve with pursed lip breathing technique.     Goals/Expected Outcomes Compliance and understanding of oxygen saturation and pursed lip breathing Compliance and understanding of oxygen saturation and pursed lip breathing Compliance and understanding of oxygen saturation and pursed lip breathing            Oxygen Discharge (Final Oxygen Re-Evaluation):  Oxygen Re-Evaluation - 10/29/20 1003      Program Oxygen Prescription   Program Oxygen Prescription Continuous    Liters per minute 3      Home Oxygen   Home Oxygen Device E-Tanks;Home Concentrator    Sleep Oxygen Prescription Continuous    Liters per minute 2.5    Home Exercise Oxygen Prescription Pulsed  Liters per minute 3    Home Resting Oxygen Prescription Pulsed    Liters per minute 2.5    Compliance with Home Oxygen Use Yes      Goals/Expected Outcomes   Short Term Goals To learn and exhibit compliance with exercise, home and travel O2 prescription;To learn and understand importance of monitoring SPO2 with pulse oximeter and demonstrate accurate use of the pulse oximeter.;To learn and understand importance of maintaining oxygen saturations>88%;To learn and demonstrate proper pursed lip breathing techniques or other breathing techniques.;To learn and demonstrate proper use of respiratory medications    Long  Term Goals Exhibits compliance with exercise, home and travel O2 prescription;Verbalizes importance of monitoring SPO2 with pulse oximeter and return demonstration;Maintenance of O2 saturations>88%;Exhibits proper breathing techniques, such as pursed lip breathing or other method taught during program  session;Demonstrates proper use of MDI's;Compliance with respiratory medication    Comments Patient is compliant with supplemental oxygen prescription and is also compliant with monitoring his oxygen saturation at home. Pt could improve with pursed lip breathing technique.    Goals/Expected Outcomes Compliance and understanding of oxygen saturation and pursed lip breathing           Initial Exercise Prescription:  Initial Exercise Prescription - 08/27/20 1200      Date of Initial Exercise RX and Referring Provider   Date 08/27/20    Referring Provider [name], DO    Expected Discharge Date 11/01/20      Oxygen   Oxygen Continuous    Liters 3      NuStep   Level 2    SPM 75    Minutes 15    METs 2      Track   Laps 10    Minutes 15    METs 2.16      Prescription Details   Frequency (times per week) 2    Duration Progress to 30 minutes of continuous aerobic without signs/symptoms of physical distress      Intensity   THRR 40-80% of Max Heartrate 61.122    Ratings of Perceived Exertion 11-13    Perceived Dyspnea 0-4      Progression   Progression Continue progressive overload as per policy without signs/symptoms or physical distress.      Resistance Training   Training Prescription Yes    Weight Orange Bands    Reps 10-15           Perform Capillary Blood Glucose checks as needed.  Exercise Prescription Changes:  Exercise Prescription Changes    Row Name 09/11/20 1100 09/25/20 1100 10/09/20 1100 10/18/20 1209 10/29/20 1000     Response to Exercise   Blood Pressure (Admit) 112/70 108/60 120/60 97/65 -   Blood Pressure (Exercise) 116/64 116/68 124/62 - -   Blood Pressure (Exit) 110/68 92/60 94/50  100/68 -   Heart Rate (Admit) 83 bpm 94 bpm 86 bpm 86 bpm -   Heart Rate (Exercise) 97 bpm 118 bpm 112 bpm 122 bpm -   Heart Rate (Exit) 90 bpm 91 bpm 94 bpm 91 bpm -   Oxygen Saturation (Admit) 94 % 96 % 97 % 96 % -   Oxygen Saturation (Exercise) 94 % 91  % 93 % 92 % -   Oxygen Saturation (Exit) 92 % 94 % 94 % 94 % -   Rating of Perceived Exertion (Exercise) 9 12 12 12  -   Perceived Dyspnea (Exercise) 0 1 1 1  -   Duration Continue with 30 min  of aerobic exercise without signs/symptoms of physical distress. Continue with 30 min of aerobic exercise without signs/symptoms of physical distress. Continue with 30 min of aerobic exercise without signs/symptoms of physical distress. Continue with 30 min of aerobic exercise without signs/symptoms of physical distress. -   Intensity -  40-80% HRR THRR unchanged THRR unchanged THRR unchanged -     Progression   Progression - - Continue to progress workloads to maintain intensity without signs/symptoms of physical distress. Continue to progress workloads to maintain intensity without signs/symptoms of physical distress. -   Average METs - - - - 2.7     Resistance Training   Training Prescription Yes Yes Yes Yes -   Weight Orange Bands Orange Bands orange bands orange bands -   Reps 10-15 10-15 10-15 10-15 -   Time 10 Minutes 10 Minutes 10 Minutes 10 Minutes -     Oxygen   Oxygen Continuous Continuous Continuous Continuous -   Liters 3 3 3 3  -     NuStep   Level 2 3 4 4  -   SPM 75 80 80 80 -   Minutes 15 15 15 15  -   METs 1.9 2.3 2.6 2.7 -     Track   Laps 11 14 12 13  -   Minutes 15 15 15 15  -          Exercise Comments:  Exercise Comments    Row Name 09/04/20 1152           Exercise Comments Patient completed first day of exercise and tolerated well with no complaints or concerns              Exercise Goals and Review:  Exercise Goals    Row Name 08/27/20 1207             Exercise Goals   Increase Physical Activity Yes       Intervention Provide advice, education, support and counseling about physical activity/exercise needs.;Develop an individualized exercise prescription for aerobic and resistive training based on initial evaluation findings, risk stratification,  comorbidities and participant's personal goals.       Expected Outcomes Short Term: Attend rehab on a regular basis to increase amount of physical activity.;Long Term: Add in home exercise to make exercise part of routine and to increase amount of physical activity.       Increase Strength and Stamina Yes       Intervention Provide advice, education, support and counseling about physical activity/exercise needs.;Develop an individualized exercise prescription for aerobic and resistive training based on initial evaluation findings, risk stratification, comorbidities and participant's personal goals.       Expected Outcomes Short Term: Increase workloads from initial exercise prescription for resistance, speed, and METs.;Short Term: Perform resistance training exercises routinely during rehab and add in resistance training at home;Long Term: Improve cardiorespiratory fitness, muscular endurance and strength as measured by increased METs and functional capacity (6MWT)       Able to understand and use rate of perceived exertion (RPE) scale Yes       Intervention Provide education and explanation on how to use RPE scale       Expected Outcomes Short Term: Able to use RPE daily in rehab to express subjective intensity level;Long Term:  Able to use RPE to guide intensity level when exercising independently       Able to understand and use Dyspnea scale Yes       Intervention Provide education and explanation on  how to use Dyspnea scale       Expected Outcomes Short Term: Able to use Dyspnea scale daily in rehab to express subjective sense of shortness of breath during exertion;Long Term: Able to use Dyspnea scale to guide intensity level when exercising independently       Knowledge and understanding of Target Heart Rate Range (THRR) Yes       Intervention Provide education and explanation of THRR including how the numbers were predicted and where they are located for reference       Expected Outcomes Short  Term: Able to state/look up THRR;Short Term: Able to use daily as guideline for intensity in rehab;Long Term: Able to use THRR to govern intensity when exercising independently       Understanding of Exercise Prescription Yes       Intervention Provide education, explanation, and written materials on patient's individual exercise prescription       Expected Outcomes Short Term: Able to explain program exercise prescription;Long Term: Able to explain home exercise prescription to exercise independently              Exercise Goals Re-Evaluation :  Exercise Goals Re-Evaluation    Row Name 09/11/20 0710 10/09/20 0849 10/29/20 1001         Exercise Goal Re-Evaluation   Exercise Goals Review Increase Physical Activity;Increase Strength and Stamina;Able to understand and use rate of perceived exertion (RPE) scale;Able to understand and use Dyspnea scale;Knowledge and understanding of Target Heart Rate Range (THRR);Understanding of Exercise Prescription Increase Physical Activity;Increase Strength and Stamina;Able to understand and use rate of perceived exertion (RPE) scale;Able to understand and use Dyspnea scale;Knowledge and understanding of Target Heart Rate Range (THRR);Understanding of Exercise Prescription Increase Physical Activity;Increase Strength and Stamina;Able to understand and use rate of perceived exertion (RPE) scale;Able to understand and use Dyspnea scale;Knowledge and understanding of Target Heart Rate Range (THRR);Understanding of Exercise Prescription     Comments Douglas Barrera has completed 2 exercise sessions and has tolerated well so far. It is too early to see improvements, but we will continue to monitor and progress as he is able. He is already independent with doing resistance bands and seems very motviated. He is exercising at 2.0 METS on the Nustep and 2.6 METS walking the track. Douglas Barrera has completed 7 exercise sessions and has made progressions with workload increases. He has tolerated  exercise very well and is independent with all of the exercises. He is exercising at 2.4 METS on the Nustep and 2.39 METS walking the track. Will continue to monitor and progress as he is able. Douglas Barrera has completed 9 exercise sessions and has been consistent with workload and MET increases. Lately his attendance has been poor due to other obligations. He is still independent with all exercises. He is exercising at 2.7 METS on the Nustep and 2.51 METS on the track. Will continue to monitor and progress as he is able.     Expected Outcomes Through exercise at rehab and home the patient will decrease shortness of breath with daily activities and feel confident in carrying out an exercise regimn at home. Through exercise at rehab and home the patient will decrease shortness of breath with daily activities and feel confident in carrying out an exercise regimn at home. Through exercise at rehab and home the patient will decrease shortness of breath with daily activities and feel confident in carrying out an exercise regimn at home.            Discharge  Exercise Prescription (Final Exercise Prescription Changes):  Exercise Prescription Changes - 10/29/20 1000      Progression   Average METs 2.7           Nutrition:  Target Goals: Understanding of nutrition guidelines, daily intake of sodium <1561m, cholesterol <2082m calories 30% from fat and 7% or less from saturated fats, daily to have 5 or more servings of fruits and vegetables.  Biometrics:  Pre Biometrics - 08/27/20 1100      Pre Biometrics   Grip Strength 29 kg            Nutrition Therapy Plan and Nutrition Goals:  Nutrition Therapy & Goals - 09/06/20 1204      Nutrition Therapy   Diet TLC    Drug/Food Interactions Statins/Certain Fruits      Personal Nutrition Goals   Nutrition Goal Pt to build a healthy plate including vegetables, fruits, whole grains, and low-fat dairy products in a heart healthy meal plan    Personal Goal  #2 Pt to incorporate 2000 mg plant sterols/stanols per day    Personal Goal #3 Pt to incorporate 28 g fiber per day      Intervention Plan   Intervention Prescribe, educate and counsel regarding individualized specific dietary modifications aiming towards targeted core components such as weight, hypertension, lipid management, diabetes, heart failure and other comorbidities.;Nutrition handout(s) given to patient.    Expected Outcomes Short Term Goal: Understand basic principles of dietary content, such as calories, fat, sodium, cholesterol and nutrients.;Long Term Goal: Adherence to prescribed nutrition plan.           Nutrition Assessments:  MEDIFICTS Score Key:  ?70 Need to make dietary changes   40-70 Heart Healthy Diet  ? 40 Therapeutic Level Cholesterol Diet  Flowsheet Row PULMONARY REHAB OTHER RESPIRATORY [facility] Total [facility] on [facility] 57     Picture Your Plate Scores:  <4<82nhealthy dietary pattern with much room for improvement.  41-50 Dietary pattern unlikely to meet recommendations for good health and room for improvement.  51-60 More healthful dietary pattern, with some room for improvement.   >60 Healthy dietary pattern, although there may be some specific behaviors that could be improved.    Nutrition Goals Re-Evaluation:  Nutrition Goals Re-Evaluation    [place] 09/06/20 1208 10/08/20 0736 10/25/20 0846         Goals   Current Weight 152 lb (68.9 kg) 148 lb 13 oz (67.5 kg) 148 lb 2.4 oz (67.2 kg)     Nutrition Goal Pt to build a healthy plate including vegetables, fruits, whole grains, and low-fat dairy products in a heart healthy meal plan Pt to build a healthy plate including vegetables, fruits, whole grains, and low-fat dairy products in a heart healthy meal plan Pt to build a healthy plate including vegetables, fruits, whole grains, and low-fat dairy products in a heart healthy  meal plan     Expected Outcome - Lipid management Lipid management           Personal Goal #2 Re-Evaluation   Personal Goal #2 Pt to incorporate 2000 mg plant sterols/stanols per day Pt to incorporate 2000 mg plant sterols/stanols per day Pt to incorporate 2000 mg plant sterols/stanols per day           Personal Goal #3 Re-Evaluation   Personal Goal #3 Pt to incorporate 28 g fiber per day Pt to incorporate 28 g fiber per  day Pt to incorporate 28 g fiber per day            Nutrition Goals Discharge (Final Nutrition Goals Re-Evaluation):  Nutrition Goals Re-Evaluation - 10/25/20 0846      Goals   Current Weight 148 lb 2.4 oz (67.2 kg)    Nutrition Goal Pt to build a healthy plate including vegetables, fruits, whole grains, and low-fat dairy products in a heart healthy meal plan    Expected Outcome Lipid management      Personal Goal #2 Re-Evaluation   Personal Goal #2 Pt to incorporate 2000 mg plant sterols/stanols per day      Personal Goal #3 Re-Evaluation   Personal Goal #3 Pt to incorporate 28 g fiber per day           Psychosocial: Target Goals: Acknowledge presence or absence of significant depression and/or stress, maximize coping skills, provide positive support system. Participant is able to verbalize types and ability to use techniques and skills needed for reducing stress and depression.  Initial Review & Psychosocial Screening:  Initial Psych Review & Screening - 08/27/20 1106      Initial Review   Current issues with None Identified      Family Dynamics   Good Support System? Yes   wife supportive     Barriers   Psychosocial barriers to participate in program There are no identifiable barriers or psychosocial needs.      Screening Interventions   Interventions Encouraged to exercise           Quality of Life Scores:  Scores of 19 and below usually indicate a poorer quality of life in these areas.  A difference of  2-3 points is a clinically  meaningful difference.  A difference of 2-3 points in the total score of the Quality of Life Index has been associated with significant improvement in overall quality of life, self-image, physical symptoms, and general health in studies assessing change in quality of life.  PHQ-9: Recent Review Flowsheet Data    Depression screen [facility] 2/9 10/16/2020 08/27/2020 04/02/2020 12/29/2019 10/01/2019   Decreased Interest 0 0 0 0 0   Down, Depressed, Hopeless 0 0 0 0 0   PHQ - 2 Score 0 0 0 0 0   Altered sleeping - 0 - - -   Tired, decreased energy - 0 - - -   Change in appetite - 0 - - -   Feeling bad or failure about yourself  - 0 - - -   Trouble concentrating - 0 - - -   Moving slowly or fidgety/restless - 0 - - -   Suicidal thoughts - 0 - - -   Difficult doing work/chores - Not difficult at all - - -     Interpretation of Total Score  Total Score Depression Severity:  1-4 = Minimal depression, 5-9 = Mild depression, 10-14 = Moderate depression, 15-19 = Moderately severe depression, 20-27 = Severe depression   Psychosocial Evaluation and Intervention:  Psychosocial Evaluation - 08/27/20 1107      Psychosocial Evaluation & Interventions   Interventions Encouraged to exercise with the program and follow exercise prescription    Comments No concerns identified    Expected Outcomes for Douglas Barrera to continue to be free of psychosocial concerns while in pulmonary rehab.    Continue Psychosocial Services  No Follow up required           Psychosocial Re-Evaluation:  Psychosocial Re-Evaluation    Douglas Barrera Name 09/10/20  1610 10/08/20 1127 10/25/20 0912 10/30/20 0850       Psychosocial Re-Evaluation   Current issues with None Identified None Identified None Identified None Identified    Comments No current issues identified No concerns identified at this time No concerns identified. No concerns identified    Expected Outcomes - For Douglas Barrera to continue to have no psychosocial concerns while participating in  pulmonary rehab. To continue to have no psychosocial concerns while in pulmonary rehab. To continue to have no psychosocial concerns while participating in pulmonary rehab.    Interventions Encouraged to attend Pulmonary Rehabilitation for the exercise Encouraged to attend Pulmonary Rehabilitation for the exercise Encouraged to attend Pulmonary Rehabilitation for the exercise Encouraged to attend Pulmonary Rehabilitation for the exercise    Continue Psychosocial Services  No Follow up required No Follow up required No Follow up required No Follow up required           Psychosocial Discharge (Final Psychosocial Re-Evaluation):  Psychosocial Re-Evaluation - 10/30/20 0850      Psychosocial Re-Evaluation   Current issues with None Identified    Comments No concerns identified    Expected Outcomes To continue to have no psychosocial concerns while participating in pulmonary rehab.    Interventions Encouraged to attend Pulmonary Rehabilitation for the exercise    Continue Psychosocial Services  No Follow up required           Education: Education Goals: Education classes will be provided on a weekly basis, covering required topics. Participant will state understanding/return demonstration of topics presented.  Learning Barriers/Preferences:  Learning Barriers/Preferences - 08/27/20 1108      Learning Barriers/Preferences   Learning Barriers None    Learning Preferences Computer/Internet           Education Topics: Risk Factor Reduction:  -Group instruction that is supported by a PowerPoint presentation. Instructor discusses the definition of a risk factor, different risk factors for pulmonary disease, and how the heart and lungs work together.   Flowsheet Row PULMONARY REHAB OTHER RESPIRATORY from 10/18/2020 in [place]  Date 09/06/20  Educator Handout      Nutrition for Pulmonary Patient:  -Group instruction provided by PowerPoint slides,  verbal discussion, and written materials to support subject matter. The instructor gives an explanation and review of healthy diet recommendations, which includes a discussion on weight management, recommendations for fruit and vegetable consumption, as well as protein, fluid, caffeine, fiber, sodium, sugar, and alcohol. Tips for eating when patients are short of breath are discussed. Flowsheet Row PULMONARY REHAB OTHER RESPIRATORY from 10/18/2020 in [place]  Date 09/20/20  Educator Meredith-Handout  Instruction Review Code 1- Verbalizes Understanding      Pursed Lip Breathing:  -Group instruction that is supported by demonstration and informational handouts. Instructor discusses the benefits of pursed lip and diaphragmatic breathing and detailed demonstration on how to preform both.     Oxygen Safety:  -Group instruction provided by PowerPoint, verbal discussion, and written material to support subject matter. There is an overview of "What is Oxygen" and "Why do we need it".  Instructor also reviews how to create a safe environment for oxygen use, the importance of using oxygen as prescribed, and the risks of noncompliance. There is a brief discussion on traveling with oxygen and resources the patient may utilize.   Oxygen Equipment:  -Group instruction provided by [facility] Staff utilizing handouts, written materials, and equipment demonstrations.   Signs and Symptoms:  -  Group instruction provided by written material and verbal discussion to support subject matter. Warning signs and symptoms of infection, stroke, and heart attack are reviewed and when to call the physician/911 reinforced. Tips for preventing the spread of infection discussed.   Advanced Directives:  -Group instruction provided by verbal instruction and written material to support subject matter. Instructor reviews Advanced Directive laws and proper instruction for filling out  document.   Pulmonary Video:  -Group video education that reviews the importance of medication and oxygen compliance, exercise, good nutrition, pulmonary hygiene, and pursed lip and diaphragmatic breathing for the pulmonary patient.   Exercise for the Pulmonary Patient:  -Group instruction that is supported by a PowerPoint presentation. Instructor discusses benefits of exercise, core components of exercise, frequency, duration, and intensity of an exercise routine, importance of utilizing pulse oximetry during exercise, safety while exercising, and options of places to exercise outside of rehab.   Flowsheet Row PULMONARY REHAB OTHER RESPIRATORY from 10/18/2020 in [place]  Date 10/04/20  Educator Handout  Instruction Review Code 1- Verbalizes Understanding      Pulmonary Medications:  -Verbally interactive group education provided by instructor with focus on inhaled medications and proper administration.   Anatomy and Physiology of the Respiratory System and Intimacy:  -Group instruction provided by PowerPoint, verbal discussion, and written material to support subject matter. Instructor reviews respiratory cycle and anatomical components of the respiratory system and their functions. Instructor also reviews differences in obstructive and restrictive respiratory diseases with examples of each. Intimacy, Sex, and Sexuality differences are reviewed with a discussion on how relationships can change when diagnosed with pulmonary disease. Common sexual concerns are reviewed.   MD DAY -A group question and answer session with a medical doctor that allows participants to ask questions that relate to their pulmonary disease state.   OTHER EDUCATION -Group or individual verbal, written, or video instructions that support the educational goals of the pulmonary rehab program. Douglas Barrera from 10/18/2020 in [place]  Date 10/18/20  Educator handout  [Met Levels]      Holiday Eating Survival Tips:  -Group instruction provided by PowerPoint slides, verbal discussion, and written materials to support subject matter. The instructor gives patients tips, tricks, and techniques to help them not only survive but enjoy the holidays despite the onslaught of food that accompanies the holidays.   Knowledge Questionnaire Score:  Knowledge Questionnaire Score - 08/27/20 1218      Knowledge Questionnaire Score   Pre Score 16/18           Core Components/Risk Factors/Patient Goals at Admission:  Personal Goals and Risk Factors at Admission - 09/10/20 0907      Core Components/Risk Factors/Patient Goals on Admission   Improve shortness of breath with ADL's Yes    Intervention Provide education, individualized exercise plan and daily activity instruction to help decrease symptoms of SOB with activities of daily living.    Expected Outcomes Short Term: Improve cardiorespiratory fitness to achieve a reduction of symptoms when performing ADLs;Long Term: Be able to perform more ADLs without symptoms or delay the onset of symptoms           Core Components/Risk Factors/Patient Goals Review:   Goals and Risk Factor Review    Row Name 09/10/20 0908 10/08/20 1129 10/25/20 0913 10/30/20 0851       Core Components/Risk Factors/Patient Goals Review   Personal Goals Review Increase knowledge of respiratory medications  and ability to use respiratory devices properly.;Tobacco Cessation;Improve shortness of breath with ADL's;Develop more efficient breathing techniques such as purse lipped breathing and diaphragmatic breathing and practicing self-pacing with activity. Increase knowledge of respiratory medications and ability to use respiratory devices properly.;Improve shortness of breath with ADL's;Develop more efficient breathing techniques such as purse lipped breathing and diaphragmatic breathing  and practicing self-pacing with activity. Increase knowledge of respiratory medications and ability to use respiratory devices properly.;Improve shortness of breath with ADL's;Develop more efficient breathing techniques such as purse lipped breathing and diaphragmatic breathing and practicing self-pacing with activity. Increase knowledge of respiratory medications and ability to use respiratory devices properly.;Improve shortness of breath with ADL's;Develop more efficient breathing techniques such as purse lipped breathing and diaphragmatic breathing and practicing self-pacing with activity.    Review Douglas Barrera just started the program, he has attended 2 exercise sessions and it is too early to have met any goals.  Will continue to support him with smoking cessation and increase workloads as appropriate. Douglas Barrera is progressing well, he is exercising at level 3 of the nustep and walking 12-14 laps on the track in 15 minutes.  He seems to be enjoying the exercise and has a positive outlook. He has been absent for the last week d/t house renovations, his exercise progress has stalled. Douglas Barrera has been absent the last 3 sessions d/t house renovation, will hopefully return to exercise this week.  He has progressed with his exercise well.    Expected Outcomes For Douglas Barrera to select a quit date and strength and stamina will improve over the next full 30 days. For Douglas Barrera to continue to progress by increasing his exercise workloads as tolerated and that he continues to exercise on his own when discharged from pulmonary rehab. For Douglas Barrera's attendance to improve, he graduates in 1 week. See admission goals           Core Components/Risk Factors/Patient Goals at Discharge (Final Review):   Goals and Risk Factor Review - 10/30/20 0851      Core Components/Risk Factors/Patient Goals Review   Personal Goals Review Increase knowledge of respiratory medications and ability to use respiratory devices properly.;Improve shortness of breath  with ADL's;Develop more efficient breathing techniques such as purse lipped breathing and diaphragmatic breathing and practicing self-pacing with activity.    Review Douglas Barrera has been absent the last 3 sessions d/t house renovation, will hopefully return to exercise this week.  He has progressed with his exercise well.    Expected Outcomes See admission goals           ITP Comments:   Comments: ITP REVIEW Pt is making expected progress toward pulmonary rehab goals after completing 9 sessions. Recommend continued exercise, life style modification, education, and utilization of breathing techniques to increase stamina and strength and decrease shortness of breath with exertion.

## 2020-11-01 ENCOUNTER — Encounter (HOSPITAL_COMMUNITY)
Admission: RE | Admit: 2020-11-01 | Discharge: 2020-11-01 | Disposition: A | Discharge status: Home / Self Care | Payer: Medicare Other | Source: Ambulatory Visit | Attending: Pulmonary Disease | Admitting: Pulmonary Disease

## 2020-11-01 ENCOUNTER — Other Ambulatory Visit: Payer: Self-pay

## 2020-11-01 DIAGNOSIS — J9611 Chronic respiratory failure with hypoxia: Secondary | ICD-10-CM | POA: Diagnosis not present

## 2020-11-01 NOTE — Progress Notes (Signed)
Daily Session Note  Patient Details  Name: Douglas Barrera MRN: [id number] Date of Birth: [birth year] Referring Provider:   [name] Pulmonary Rehab Walk Test from 08/27/2020 in [place]  Referring Provider [name], DO      Encounter Date: 11/01/2020  Check In:  Session Check In - 11/01/20 1026      Check-In   Supervising physician immediately available to respond to emergencies [facility] immediately available    Physician(s) Dr. Doristine Bosworth    Location [facility]    Staff Present Rosebud Poles, RN, BSN;Lisa Ysidro Evert, RN;Jessica Hassell Done, MS, ACSM-CEP, Exercise Physiologist    Virtual Visit No    Medication changes reported     No    Fall or balance concerns reported    No    Tobacco Cessation No Change    Warm-up and Cool-down Not performed (comment)    Resistance Training Performed No    VAD Patient? No    PAD/SET Patient? No      Pain Assessment   Currently in Pain? No/denies    Multiple Pain Sites No           Capillary Blood Glucose: No results found for this or any previous visit (from the past 24 hour(s)).    Social History   Tobacco Use  Smoking Status Former Smoker  . Packs/day: 2.00  . Years: 49.00  . Pack years: 98.00  . Types: Cigars, Cigarettes  . Start date: 10/17/1968  . Quit date: 08/18/2018  . Years since quitting: 2.2  Smokeless Tobacco Never Used  Tobacco Comment   quit smoking cigarettes in Dec 2019 but do backslide occasional, but smokes 3-4 cigars a week. small sigar on occasion 10/13/18    Goals Met:  Proper associated with RPD/PD & O2 Sat Improved SOB with ADL's Exercise tolerated well No report of cardiac concerns or symptoms  Goals Unmet:  Not Applicable  Comments: Service time is from 0945 to 1015 Patient completed pulmonary rehab program today and made improvements with post 6 minute walk test. He also decreased SOB with ADL's.     Dr. Fransico Him is Medical  Director for Cardiac Rehab at [facility].

## 2020-11-18 DIAGNOSIS — Z1212 Encounter for screening for malignant neoplasm of rectum: Secondary | ICD-10-CM | POA: Diagnosis not present

## 2020-11-18 DIAGNOSIS — Z1211 Encounter for screening for malignant neoplasm of colon: Secondary | ICD-10-CM | POA: Diagnosis not present

## 2020-11-19 NOTE — Progress Notes (Signed)
Discharge Progress Report  Patient Details  Name: Douglas Barrera MRN: [id number] Date of Birth: 1951/01/03 Referring Provider:   [name] Walk Test from 08/27/2020 in [place]  Referring Provider [name], DO       Number of Visits: 11  Reason for Discharge:  Patient reached a stable level of exercise. Patient independent in their exercise. Patient has met program and personal goals.  Smoking History:  Social History   Tobacco Use  Smoking Status Former Smoker  . Packs/day: 2.00  . Years: 49.00  . Pack years: 98.00  . Types: Cigars, Cigarettes  . Start date: 10/17/1968  . Quit date: 08/18/2018  . Years since quitting: 2.2  Smokeless Tobacco Never Used  Tobacco Comment   quit smoking cigarettes in Dec 2019 but do backslide occasional, but smokes 3-4 cigars a week. small sigar on occasion 10/13/18    Diagnosis:  Chronic hypoxemic respiratory failure [facility])  ADL UCSD:  Pulmonary Assessment Scores    Row Name 08/27/20 1118 11/01/20 1206       ADL UCSD   ADL Phase Entry Exit    SOB Score total 37 24         CAT Score   CAT Score 16 18         mMRC Score   mMRC Score 1 0           Initial Exercise Prescription:  Initial Exercise Prescription - 08/27/20 1200      Date of Initial Exercise RX and Referring Provider   Date 08/27/20    Referring Provider [name], DO    Expected Discharge Date 11/01/20      Oxygen   Oxygen Continuous    Liters 3      NuStep   Level 2    SPM 75    Minutes 15    METs 2      Track   Laps 10    Minutes 15    METs 2.16      Prescription Details   Frequency (times per week) 2    Duration Progress to 30 minutes of continuous aerobic without signs/symptoms of physical distress      Intensity   THRR 40-80% of Max Heartrate 61.122    Ratings of Perceived Exertion 11-13    Perceived Dyspnea 0-4      Progression   Progression Continue progressive  overload as per policy without signs/symptoms or physical distress.      Resistance Training   Training Prescription Yes    Weight Orange Bands    Reps 10-15           Discharge Exercise Prescription (Final Exercise Prescription Changes):  Exercise Prescription Changes - 10/29/20 1000      Progression   Average METs 2.7           Functional Capacity:  6 Minute Walk    Row Name 08/27/20 1122 11/01/20 1201       6 Minute Walk   Phase Initial Discharge    Distance 1140 feet 1200 feet    Distance % Change -- 5.26 %    Distance Feet Change -- 60 ft    Walk Time 6 minutes 6 minutes    # of Rest Breaks 0 0    MPH -- 2.27    METS 2.16 2.95    RPE 11 8    Perceived Dyspnea  1 0    VO2  Peak 9.48 10.32    Symptoms Yes (comment) No    Comments SOB, RPD = 1. No pain or other complaints noted. --    Resting HR 68 bpm 72 bpm    Resting BP 98/60 103/65    Resting Oxygen Saturation  98 % 96 %    Exercise Oxygen Saturation  during 6 min walk 94 % 93 %    Max Ex. HR 107 bpm 111 bpm    Max Ex. BP 114/60 122/66    2 Minute Post BP 110/64 102/64         Interval HR   1 Minute HR 99 87    2 Minute HR 104 96    3 Minute HR 107 100    4 Minute HR 105 100    5 Minute HR 107 101    6 Minute HR 107 111    2 Minute Post HR 81 80    Interval Heart Rate? Yes Yes         Interval Oxygen   Interval Oxygen? Yes Yes    Baseline Oxygen Saturation % -- 96 %    1 Minute Oxygen Saturation % 98 % 96 %    1 Minute Liters of Oxygen 3 L 3 L    2 Minute Oxygen Saturation % 96 % 94 %    2 Minute Liters of Oxygen 3 L 3 L    3 Minute Oxygen Saturation % 95 % 93 %    3 Minute Liters of Oxygen 3 L 3 L    4 Minute Oxygen Saturation % 94 % 93 %    4 Minute Liters of Oxygen 3 L 3 L    5 Minute Oxygen Saturation % 94 % 94 %    5 Minute Liters of Oxygen -- 3 L    6 Minute Oxygen Saturation % 94 % 93 %    6 Minute Liters of Oxygen 3 L 3 L    2 Minute Post Oxygen Saturation % 97 % 97 %    2  Minute Post Liters of Oxygen 3 L 3 L           Psychological, QOL, Others - Outcomes: PHQ 2/9: Depression screen [facility] 2/9 11/01/2020 10/16/2020 08/27/2020 04/02/2020 12/29/2019  Decreased Interest 0 0 0 0 0  Down, Depressed, Hopeless 0 0 0 0 0  PHQ - 2 Score 0 0 0 0 0  Altered sleeping 0 - 0 - -  Tired, decreased energy 0 - 0 - -  Change in appetite 0 - 0 - -  Feeling bad or failure about yourself  0 - 0 - -  Trouble concentrating 0 - 0 - -  Moving slowly or fidgety/restless 0 - 0 - -  Suicidal thoughts 0 - 0 - -  PHQ-9 Score 0 - - - -  Difficult doing work/chores Not difficult at all - Not difficult at all - -    Quality of Life:   Personal Goals: Goals established at orientation with interventions provided to work toward goal.  Personal Goals and Risk Factors at Admission - 09/10/20 0907      Core Components/Risk Factors/Patient Goals on Admission   Improve shortness of breath with ADL's Yes    Intervention Provide education, individualized exercise plan and daily activity instruction to help decrease symptoms of SOB with activities of daily living.    Expected Outcomes Short Term: Improve cardiorespiratory fitness to achieve a reduction of symptoms when performing ADLs;Long  Term: Be able to perform more ADLs without symptoms or delay the onset of symptoms            Personal Goals Discharge:  Goals and Risk Factor Review    Row Name 09/10/20 0908 10/08/20 1129 10/25/20 0913 10/30/20 0851       Core Components/Risk Factors/Patient Goals Review   Personal Goals Review Increase knowledge of respiratory medications and ability to use respiratory devices properly.;Tobacco Cessation;Improve shortness of breath with ADL's;Develop more efficient breathing techniques such as purse lipped breathing and diaphragmatic breathing and practicing self-pacing with activity. Increase knowledge of respiratory medications and ability to use respiratory devices properly.;Improve shortness of  breath with ADL's;Develop more efficient breathing techniques such as purse lipped breathing and diaphragmatic breathing and practicing self-pacing with activity. Increase knowledge of respiratory medications and ability to use respiratory devices properly.;Improve shortness of breath with ADL's;Develop more efficient breathing techniques such as purse lipped breathing and diaphragmatic breathing and practicing self-pacing with activity. Increase knowledge of respiratory medications and ability to use respiratory devices properly.;Improve shortness of breath with ADL's;Develop more efficient breathing techniques such as purse lipped breathing and diaphragmatic breathing and practicing self-pacing with activity.    Review Ayvion just started the program, he has attended 2 exercise sessions and it is too early to have met any goals.  Will continue to support him with smoking cessation and increase workloads as appropriate. Cirilo is progressing well, he is exercising at level 3 of the nustep and walking 12-14 laps on the track in 15 minutes.  He seems to be enjoying the exercise and has a positive outlook. He has been absent for the last week d/t house renovations, his exercise progress has stalled. Emrah has been absent the last 3 sessions d/t house renovation, will hopefully return to exercise this week.  He has progressed with his exercise well.    Expected Outcomes For Marcelo to select a quit date and strength and stamina will improve over the next full 30 days. For Rannie to continue to progress by increasing his exercise workloads as tolerated and that he continues to exercise on his own when discharged from pulmonary rehab. For Jarel's attendance to improve, he graduates in 1 week. See admission goals           Exercise Goals and Review:  Exercise Goals    Row Name 08/27/20 1207             Exercise Goals   Increase Physical Activity Yes       Intervention Provide advice, education, support and  counseling about physical activity/exercise needs.;Develop an individualized exercise prescription for aerobic and resistive training based on initial evaluation findings, risk stratification, comorbidities and participant's personal goals.       Expected Outcomes Short Term: Attend rehab on a regular basis to increase amount of physical activity.;Long Term: Add in home exercise to make exercise part of routine and to increase amount of physical activity.       Increase Strength and Stamina Yes       Intervention Provide advice, education, support and counseling about physical activity/exercise needs.;Develop an individualized exercise prescription for aerobic and resistive training based on initial evaluation findings, risk stratification, comorbidities and participant's personal goals.       Expected Outcomes Short Term: Increase workloads from initial exercise prescription for resistance, speed, and METs.;Short Term: Perform resistance training exercises routinely during rehab and add in resistance training at home;Long Term: Improve cardiorespiratory fitness, muscular endurance and strength as  measured by increased METs and functional capacity (6MWT)       Able to understand and use rate of perceived exertion (RPE) scale Yes       Intervention Provide education and explanation on how to use RPE scale       Expected Outcomes Short Term: Able to use RPE daily in rehab to express subjective intensity level;Long Term:  Able to use RPE to guide intensity level when exercising independently       Able to understand and use Dyspnea scale Yes       Intervention Provide education and explanation on how to use Dyspnea scale       Expected Outcomes Short Term: Able to use Dyspnea scale daily in rehab to express subjective sense of shortness of breath during exertion;Long Term: Able to use Dyspnea scale to guide intensity level when exercising independently       Knowledge and understanding of Target Heart Rate  Range (THRR) Yes       Intervention Provide education and explanation of THRR including how the numbers were predicted and where they are located for reference       Expected Outcomes Short Term: Able to state/look up THRR;Short Term: Able to use daily as guideline for intensity in rehab;Long Term: Able to use THRR to govern intensity when exercising independently       Understanding of Exercise Prescription Yes       Intervention Provide education, explanation, and written materials on patient's individual exercise prescription       Expected Outcomes Short Term: Able to explain program exercise prescription;Long Term: Able to explain home exercise prescription to exercise independently              Exercise Goals Re-Evaluation:  Exercise Goals Re-Evaluation    Row Name 09/11/20 0710 10/09/20 0849 10/29/20 1001         Exercise Goal Re-Evaluation   Exercise Goals Review Increase Physical Activity;Increase Strength and Stamina;Able to understand and use rate of perceived exertion (RPE) scale;Able to understand and use Dyspnea scale;Knowledge and understanding of Target Heart Rate Range (THRR);Understanding of Exercise Prescription Increase Physical Activity;Increase Strength and Stamina;Able to understand and use rate of perceived exertion (RPE) scale;Able to understand and use Dyspnea scale;Knowledge and understanding of Target Heart Rate Range (THRR);Understanding of Exercise Prescription Increase Physical Activity;Increase Strength and Stamina;Able to understand and use rate of perceived exertion (RPE) scale;Able to understand and use Dyspnea scale;Knowledge and understanding of Target Heart Rate Range (THRR);Understanding of Exercise Prescription     Comments Edna has completed 2 exercise sessions and has tolerated well so far. It is too early to see improvements, but we will continue to monitor and progress as he is able. He is already independent with doing resistance bands and seems very  motviated. He is exercising at 2.0 METS on the Nustep and 2.6 METS walking the track. Millard has completed 7 exercise sessions and has made progressions with workload increases. He has tolerated exercise very well and is independent with all of the exercises. He is exercising at 2.4 METS on the Nustep and 2.39 METS walking the track. Will continue to monitor and progress as he is able. Shankar has completed 9 exercise sessions and has been consistent with workload and MET increases. Lately his attendance has been poor due to other obligations. He is still independent with all exercises. He is exercising at 2.7 METS on the Nustep and 2.51 METS on the track. Will continue to monitor and progress  as he is able.     Expected Outcomes Through exercise at rehab and home the patient will decrease shortness of breath with daily activities and feel confident in carrying out an exercise regimn at home. Through exercise at rehab and home the patient will decrease shortness of breath with daily activities and feel confident in carrying out an exercise regimn at home. Through exercise at rehab and home the patient will decrease shortness of breath with daily activities and feel confident in carrying out an exercise regimn at home.            Nutrition & Weight - Outcomes:  Pre Biometrics - 08/27/20 1100      Pre Biometrics   Grip Strength 29 kg           Post Biometrics - 11/01/20 1204       Post  Biometrics   Grip Strength 31 kg           Nutrition:  Nutrition Therapy & Goals - 09/06/20 1204      Nutrition Therapy   Diet TLC    Drug/Food Interactions Statins/Certain Fruits      Personal Nutrition Goals   Nutrition Goal Pt to build a healthy plate including vegetables, fruits, whole grains, and low-fat dairy products in a heart healthy meal plan    Personal Goal #2 Pt to incorporate 2000 mg plant sterols/stanols per day    Personal Goal #3 Pt to incorporate 28 g fiber per day      Intervention  Plan   Intervention Prescribe, educate and counsel regarding individualized specific dietary modifications aiming towards targeted core components such as weight, hypertension, lipid management, diabetes, heart failure and other comorbidities.;Nutrition handout(s) given to patient.    Expected Outcomes Short Term Goal: Understand basic principles of dietary content, such as calories, fat, sodium, cholesterol and nutrients.;Long Term Goal: Adherence to prescribed nutrition plan.           Nutrition Discharge:   Education Questionnaire Score:  Knowledge Questionnaire Score - 11/01/20 1206      Knowledge Questionnaire Score   Post Score 7/8   Did not complete the back part of sheet          Goals reviewed with patient; copy given to patient.

## 2020-11-19 NOTE — Addendum Note (Signed)
Encounter addended by: Lance Morin, RN on: 11/19/2020 1:55 PM  Actions taken: Clinical Note Signed, Episode resolved

## 2020-11-22 ENCOUNTER — Other Ambulatory Visit: Payer: Self-pay | Admitting: *Deleted

## 2020-11-22 DIAGNOSIS — Z87891 Personal history of nicotine dependence: Secondary | ICD-10-CM

## 2020-11-22 DIAGNOSIS — F17210 Nicotine dependence, cigarettes, uncomplicated: Secondary | ICD-10-CM

## 2020-11-27 ENCOUNTER — Other Ambulatory Visit: Payer: Self-pay

## 2020-11-27 DIAGNOSIS — Z1211 Encounter for screening for malignant neoplasm of colon: Secondary | ICD-10-CM

## 2020-11-27 DIAGNOSIS — Z1212 Encounter for screening for malignant neoplasm of rectum: Secondary | ICD-10-CM

## 2020-11-27 LAB — POC HEMOCCULT BLD/STL (HOME/3-CARD/SCREEN)
Card #2 Fecal Occult Blod, POC: NEGATIVE
Card #3 Fecal Occult Blood, POC: NEGATIVE
Fecal Occult Blood, POC: NEGATIVE

## 2020-12-18 DIAGNOSIS — H2513 Age-related nuclear cataract, bilateral: Secondary | ICD-10-CM | POA: Diagnosis not present

## 2021-01-01 DIAGNOSIS — Z23 Encounter for immunization: Secondary | ICD-10-CM | POA: Diagnosis not present

## 2021-01-02 ENCOUNTER — Other Ambulatory Visit: Payer: Self-pay

## 2021-01-02 ENCOUNTER — Encounter: Payer: Self-pay | Admitting: Pulmonary Disease

## 2021-01-02 ENCOUNTER — Ambulatory Visit (INDEPENDENT_AMBULATORY_CARE_PROVIDER_SITE_OTHER): Payer: Medicare Other | Admitting: Pulmonary Disease

## 2021-01-02 VITALS — BP 118/64 | HR 93 | Temp 99.1°F | Ht 64.5 in | Wt 147.4 lb

## 2021-01-02 DIAGNOSIS — J449 Chronic obstructive pulmonary disease, unspecified: Secondary | ICD-10-CM | POA: Diagnosis not present

## 2021-01-02 DIAGNOSIS — J9611 Chronic respiratory failure with hypoxia: Secondary | ICD-10-CM | POA: Diagnosis not present

## 2021-01-02 DIAGNOSIS — R911 Solitary pulmonary nodule: Secondary | ICD-10-CM

## 2021-01-02 DIAGNOSIS — D751 Secondary polycythemia: Secondary | ICD-10-CM | POA: Diagnosis not present

## 2021-01-02 NOTE — Patient Instructions (Signed)
Thank you for visiting Dr. Valeta Harms at [facility]. Today we recommend the following:  Continue current nebulizer regimen  Call me with any changes in symptoms   Return in about 6 months (around 07/05/2021).    Please do your part to reduce the spread of COVID-19.

## 2021-01-02 NOTE — Progress Notes (Signed)
Synopsis: Referred in September 2019 for severe COPD by [name], MD  Subjective:   PATIENT ID: Douglas Barrera GENDER: male DOB: [birth year], MRN: [id number]  Chief Complaint  Patient presents with  . Follow-up    Pt states he has been doing okay since last visit and denies any real concerns.    He was diagnosed with COPD about 3-4 years ago. He quit smoking a week ago after being placed on oxygen. He feels much better after being placed on O2. He has much more energy after start O2. He has never been hospitalized for an exacerbation. Smoked for 40 years, 1 ppd.  Patient denies weight loss, fevers, chills, night sweats.  He does have sputum production.  He is able to complete most of his activities of daily living.  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  He used to travel around to local bakery's as well as a crossed many states to teach institutions and bakeries how to bake certain tasks.  He is able to go to the grocery store, pushes on buggy as well as mow his grass while riding a lawnmower.  OV 07/07/2018: Patient been doing well since last office visit.  He is trying to obtain POC.  Insurance has required requalification for O2 needs.  Presents today for walking in the office for that.  He has been using his new nebulized medications.  He is not really sure he seen much difference but he does think that his functional status is good.  Today he plans to go home and mow the grass on riding mower.  He is able to go to the grocery store.  He still is able to work in his kitchen and bake.  Overall doing well.  OV 10/13/2018: This is a 70 year old with very severe COPD.  Overall has been doing well for the past several months.  He had a low-dose lung cancer screening CT which revealed coronary calcifications and no evidence or/concern of a underlying malignancy.  This is being done by his primary care provider."  Radiology recommended a one-year follow-up.  He does have recent evaluation by cardiology that  plans a CT a coronary evaluation.  Overall he has been doing well and has no significant complaints today.  He enjoys using his nebulizers.  He plans to pick a cherry pie this weekend for his wife/Valentine's Day.  As for his current respiratory symptoms he maintains with some dyspnea on exertion.  He does have a planned colonoscopy for next week.  OV 06/30/2019: Patient doing very well today he has very severe COPD at baseline.  He was on nebulized LAMA LABA however currently only using Perforomist and as needed duo nebs.  He is stable on 3 L nasal cannula.  Able to complete most of his activities of daily living.  States that he mowed the grass last week with no trouble.  Trying to be as active as possible.  He is planning a trip to go across the country driving.  He would like to attempt to be approved for a POC concentrator that would help his travel significantly.  Having a car adapter would make this travel much simpler.  He will also plan to travel with his oxygen tanks.  He has a SpO2 device that he checks his sats regularly.  Otherwise no significant change in his respiratory symptoms baseline.  OV 12/21/19: Here for follow up regarding COPD.  Overall no complaints today.  He is at his standard baseline of 3  L.  O2 sat 89% today in the office.  He does check his sats regularly at home and usually there stable in the high 80s to low 90s.  He is able to complete all of his activities of daily living.  He has 2 new kittens in the home.  He does have some seasonal allergies that has been taking over-the-counter antihistamines for.  He does have some questions about his oxygen needs from [facility].  They requested a recertification.  Work on a ensure that he has this completed today in the office with no issues from home health agency.  OV 06/13/2020: Patient here today for follow-up regarding severe COPD.  He is currently only using Perforomist.  Has not continued use of his other triple therapy  inhalers.  He did have an exacerbation 2 months ago was placed on steroids by his primary care provider.  He is trying to stay active as he could.  His primary care provider recommended talking to me today about increasing his exercise options.  We also discussed pulmonary rehabilitation referral.  He is worried about having to drive all the way to [facility] for this if he can have something done in [place] that would make life simpler.  He has still been baking.  Recently picked a batch of [place] bars for a family get together.  From a respiratory standpoint his dyspnea on exertion is still present.  He feels like he is a little setback from where he was after his exacerbation 2 months ago not quite back at baseline.  He would like to become a little bit more active.  He does have sputum production clear whitish at times.  No other discoloration.  No fevers chills night sweats weight loss.  OV 01/02/2021: Here today for COPD follow up. No complaints today. Breathing remains stable. He is able to complete his activities of daily living. Mowing the grass. Stable using his nebulizers and he prefers using nebs over inhalers.    Past Medical History:  Diagnosis Date  . COPD (chronic obstructive pulmonary disease) ([place])   . GERD (gastroesophageal reflux disease)   . History of elevated lipids   . Hyperlipidemia   . IBS (irritable bowel syndrome)   . Labile hypertension    patient is not on meds for hypertension  . Prediabetes   . Secondary polycythemia 05/25/2018  . Tubular adenoma of colon   . Vitamin D deficiency      Family History  Problem Relation Age of Onset  . Breast cancer Mother   . Stomach cancer Mother      Past Surgical History:  Procedure Laterality Date  . BASAL CELL CARCINOMA EXCISION  2008   Left [place]  . COLONOSCOPY WITH PROPOFOL N/A 10/26/2018   Procedure: COLONOSCOPY WITH PROPOFOL;  Surgeon: Ladene Artist, MD;  Location: [facility];  Service: Endoscopy;   Laterality: N/A;  . TESTICLE SURGERY     testicle removed    Social History   Socioeconomic History  . Marital status: Married    Spouse name: Not on file  . Number of children: 0  . Years of education: Not on file  . Highest education level: Not on file  Occupational History  . Occupation: retired  Tobacco Use  . Smoking status: Former Smoker    Packs/day: 2.00    Years: 49.00    Pack years: 98.00    Types: Cigars, Cigarettes    Start date: 10/17/1968    Quit date: 08/18/2018  Years since quitting: 2.3  . Smokeless tobacco: Never Used  . Tobacco comment: quit smoking cigarettes in Dec 2019 but do backslide occasional, but smokes 3-4 cigars a week. small sigar on occasion 10/13/18  Substance and Sexual Activity  . Alcohol use: No    Alcohol/week: 0.0 standard drinks  . Drug use: No  . Sexual activity: Not on file  Other Topics Concern  . Not on file  Social History Narrative  . Not on file   Social Determinants of Health   Financial Resource Strain: Not on file  Food Insecurity: Not on file  Transportation Needs: Not on file  Physical Activity: Not on file  Stress: Not on file  Social Connections: Not on file  Intimate Partner Violence: Not on file     Allergies  Allergen Reactions  . Niacin And Related Other (See Comments)    Unknown Childhood reaction.     Outpatient Medications Prior to Visit  Medication Sig Dispense Refill  . albuterol (VENTOLIN HFA) 108 (90 Base) MCG/ACT inhaler Inhale 1 to 2 puffs 4 x day or every 4 hours to rescue asthma 18 g 1  . aspirin 81 MG tablet Take 81 mg by mouth every evening.     . budesonide (PULMICORT) 0.5 MG/2ML nebulizer solution Take 2 mLs (0.5 mg total) by nebulization 2 (two) times daily. 120 mL 11  . Cholecalciferol (VITAMIN D3) 5000 units CAPS Take 10,000 Units by mouth daily.     . diphenhydrAMINE (BENADRYL) 25 MG tablet Take 50 mg by mouth daily.     . fluticasone (FLONASE) 50 MCG/ACT nasal spray Place 2 sprays  into both nostrils daily. (Patient taking differently: Place 2 sprays into both nostrils every evening.) 48 g 3  . formoterol (PERFOROMIST) 20 MCG/2ML nebulizer solution Use 2 mls by Inhalation 2 x /day 360 mL 2  . guaifenesin (HUMIBID E) 400 MG TABS tablet Take 800 mg by mouth daily.     . revefenacin (YUPELRI) 175 MCG/3ML nebulizer solution Take 3 mLs (175 mcg total) by nebulization daily. 90 mL 11  . rosuvastatin (CRESTOR) 5 MG tablet Take 1 tab three days a week (WMF) in the evening for cholesterol. 38 tablet 3  . traZODone (DESYREL) 150 MG tablet TAKE 1 TABLET 1 HOUR BEFORE BEDTIME AS NEEDED FOR SLEEP 90 tablet 3  . trolamine salicylate (ASPERCREME) 10 % cream Apply 1 application topically as needed for muscle pain.    Marland Kitchen zinc gluconate 50 MG tablet Take 50 mg by mouth daily.     No facility-administered medications prior to visit.    Review of Systems  Constitutional: Negative for chills, fever, malaise/fatigue and weight loss.  HENT: Negative for hearing loss, sore throat and tinnitus.   Eyes: Negative for blurred vision and double vision.  Respiratory: Positive for shortness of breath. Negative for cough, hemoptysis, sputum production, wheezing and stridor.   Cardiovascular: Negative for chest pain, palpitations, orthopnea, leg swelling and PND.  Gastrointestinal: Negative for abdominal pain, constipation, diarrhea, heartburn, nausea and vomiting.  Genitourinary: Negative for dysuria, hematuria and urgency.  Musculoskeletal: Negative for joint pain and myalgias.  Skin: Negative for itching and rash.  Neurological: Negative for dizziness, tingling, weakness and headaches.  Endo/Heme/Allergies: Negative for environmental allergies. Does not bruise/bleed easily.  Psychiatric/Behavioral: Negative for depression. The patient is not nervous/anxious and does not have insomnia.   All other systems reviewed and are negative.    Objective:  Physical Exam Vitals reviewed.  Constitutional:  General: He is not in acute distress.    Appearance: He is well-developed.  HENT:     Head: Normocephalic and atraumatic.  Eyes:     General: No scleral icterus.    Conjunctiva/sclera: Conjunctivae normal.     Pupils: Pupils are equal, round, and reactive to light.  Neck:     Vascular: No JVD.     Trachea: No tracheal deviation.  Cardiovascular:     Rate and Rhythm: Normal rate and regular rhythm.     Heart sounds: Normal heart sounds. No murmur heard.   Pulmonary:     Effort: Pulmonary effort is normal. No tachypnea, accessory muscle usage or respiratory distress.     Breath sounds: No stridor. No wheezing, rhonchi or rales.     Comments: Diminished breath sounds BL  Abdominal:     General: Bowel sounds are normal. There is no distension.     Palpations: Abdomen is soft.     Tenderness: There is no abdominal tenderness.  Musculoskeletal:        General: No tenderness.     Cervical back: Neck supple.  Lymphadenopathy:     Cervical: No cervical adenopathy.  Skin:    General: Skin is warm and dry.     Capillary Refill: Capillary refill takes less than 2 seconds.     Findings: No rash.  Neurological:     Mental Status: He is alert and oriented to person, place, and time.  Psychiatric:        Behavior: Behavior normal.      Vitals:   01/02/21 1542  BP: 118/64  Pulse: 93  Temp: 99.1 F (37.3 C)  TempSrc: Temporal  SpO2: 91%  Weight: 147 lb 6.4 oz (66.9 kg)  Height: 5' 4.5" (1.638 m)   91% on 3 L nasal cannula BMI Readings from Last 3 Encounters:  01/02/21 24.91 kg/m  10/18/20 25.04 kg/m  10/16/20 25.52 kg/m   Wt Readings from Last 3 Encounters:  01/02/21 147 lb 6.4 oz (66.9 kg)  10/18/20 148 lb 2.4 oz (67.2 kg)  10/16/20 151 lb (68.5 kg)     CBC    Component Value Date/Time   WBC 7.0 10/16/2020 1447   RBC 5.28 10/16/2020 1447   HGB 16.6 10/16/2020 1447   HGB 16.8 08/19/2018 1308   HCT 48.8 10/16/2020 1447   PLT 206 10/16/2020 1447   PLT  194 08/19/2018 1308   MCV 92.4 10/16/2020 1447   MCH 31.4 10/16/2020 1447   MCHC 34.0 10/16/2020 1447   RDW 11.6 10/16/2020 1447   LYMPHSABS 1,666 10/16/2020 1447   MONOABS 1.3 (H) 08/19/2018 1308   EOSABS 70 10/16/2020 1447   BASOSABS 28 10/16/2020 1447    Chest Imaging: 08/26/2017 CT chest lung cancer screening, LD CT Lung-RADS 1 radiology, recommending annual continued screening. Evidence of mild bronchial thickening, scattered areas of groundglass, areas of scarring. The patient's images have been independently reviewed by me.   09/15/2018: LDCT -  LUNG RADS 2S, with coronary calcifications  The patient's images have been independently reviewed by me.    11/01/2019 LDCT: LUNG RAD 2, continuing annual follow up, small subcentimeter nodule 89mm  The patient's images have been independently reviewed by me.       Pulmonary Functions Testing Results: PFT Results Latest Ref Rng & Units 05/31/2018 03/24/2018  FVC-Pre L 1.83 1.72  FVC-Predicted Pre % 48 45  FVC-Post L 1.89 -  FVC-Predicted Post % 50 -  Pre FEV1/FVC % % 46 36  Post FEV1/FCV % % 34 -  FEV1-Pre L 0.84 0.61  FEV1-Predicted Pre % 30 22  FEV1-Post L 0.65 -  DLCO uncorrected ml/min/mmHg 8.83 7.09  DLCO UNC% % 34 27  DLCO corrected ml/min/mmHg 7.96 6.28  DLCO COR %Predicted % 31 24  DLVA Predicted % 52 49  TLC L - 5.85  TLC % Predicted % - 97  RV % Predicted % - 198    FeNO: None   Pathology: None   Echocardiogram: None   Heart Catheterization: None     Assessment & Plan:   Chronic hypoxemic respiratory failure (HCC)  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HCC)  Secondary polycythemia  Left upper lobe pulmonary nodule   Discussion:  This is a 70 year old gentleman, severely severe COPD, FEV1 22%, chronic hypoxemic respiratory failure, has a new POC tolerating this transition well.  He had end up buying it on his own.  mMRC functional status level 1.  No recent exacerbations.  Compliant with his nebulized regimen.  This is  well covered by his insurance  Plan: Continue triple therapy nebulizer with steroid, LABA, LAMA Continue oxygen supply orders. He will need flu shot in the fall. Continue albuterol as needed Continue to follow-up in lung cancer screening program. Return to clinic in 6 months or as needed.   Current Outpatient Medications:  .  albuterol (VENTOLIN HFA) 108 (90 Base) MCG/ACT inhaler, Inhale 1 to 2 puffs 4 x day or every 4 hours to rescue asthma, Disp: 18 g, Rfl: 1 .  aspirin 81 MG tablet, Take 81 mg by mouth every evening. , Disp: , Rfl:  .  budesonide (PULMICORT) 0.5 MG/2ML nebulizer solution, Take 2 mLs (0.5 mg total) by nebulization 2 (two) times daily., Disp: 120 mL, Rfl: 11 .  Cholecalciferol (VITAMIN D3) 5000 units CAPS, Take 10,000 Units by mouth daily. , Disp: , Rfl:  .  diphenhydrAMINE (BENADRYL) 25 MG tablet, Take 50 mg by mouth daily. , Disp: , Rfl:  .  fluticasone (FLONASE) 50 MCG/ACT nasal spray, Place 2 sprays into both nostrils daily. (Patient taking differently: Place 2 sprays into both nostrils every evening.), Disp: 48 g, Rfl: 3 .  formoterol (PERFOROMIST) 20 MCG/2ML nebulizer solution, Use 2 mls by Inhalation 2 x /day, Disp: 360 mL, Rfl: 2 .  guaifenesin (HUMIBID E) 400 MG TABS tablet, Take 800 mg by mouth daily. , Disp: , Rfl:  .  revefenacin (YUPELRI) 175 MCG/3ML nebulizer solution, Take 3 mLs (175 mcg total) by nebulization daily., Disp: 90 mL, Rfl: 11 .  rosuvastatin (CRESTOR) 5 MG tablet, Take 1 tab three days a week (WMF) in the evening for cholesterol., Disp: 38 tablet, Rfl: 3 .  traZODone (DESYREL) 150 MG tablet, TAKE 1 TABLET 1 HOUR BEFORE BEDTIME AS NEEDED FOR SLEEP, Disp: 90 tablet, Rfl: 3 .  trolamine salicylate (ASPERCREME) 10 % cream, Apply 1 application topically as needed for muscle pain., Disp: , Rfl:  .  zinc gluconate 50 MG tablet, Take 50 mg by mouth daily., Disp: , Rfl:     Garner Nash, DO [facility] 01/02/2021 4:10 PM

## 2021-01-09 ENCOUNTER — Ambulatory Visit (INDEPENDENT_AMBULATORY_CARE_PROVIDER_SITE_OTHER): Payer: Medicare Other | Admitting: Acute Care

## 2021-01-09 ENCOUNTER — Other Ambulatory Visit: Payer: Self-pay

## 2021-01-09 ENCOUNTER — Encounter: Payer: Self-pay | Admitting: Acute Care

## 2021-01-09 ENCOUNTER — Ambulatory Visit (INDEPENDENT_AMBULATORY_CARE_PROVIDER_SITE_OTHER): Payer: Medicare Other

## 2021-01-09 DIAGNOSIS — Z122 Encounter for screening for malignant neoplasm of respiratory organs: Secondary | ICD-10-CM

## 2021-01-09 DIAGNOSIS — Z87891 Personal history of nicotine dependence: Secondary | ICD-10-CM

## 2021-01-09 DIAGNOSIS — F17210 Nicotine dependence, cigarettes, uncomplicated: Secondary | ICD-10-CM | POA: Diagnosis not present

## 2021-01-09 NOTE — Patient Instructions (Signed)
Thank you for participating in the [facility] Cancer Screening Program. It was our pleasure to meet you today. We will call you with the results of your scan within the next few days. Your scan will be assigned a Lung RADS category score by the physicians reading the scans.  This Lung RADS score determines follow up scanning.  See below for description of categories, and follow up screening recommendations. We will be in touch to schedule your follow up screening annually or based on recommendations of our providers. We will fax a copy of your scan results to your Primary Care Physician, or the physician who referred you to the program, to ensure they have the results. Please call the office if you have any questions or concerns regarding your scanning experience or results.  Our office number is [phone]. Please speak with Denise Phelps, RN. She is our Lung Cancer Screening RN. If she is unavailable when you call, please have the office staff send her a message. She will return your call at her earliest convenience. Remember, if your scan is normal, we will scan you annually as long as you continue to meet the criteria for the program. (Age 55-77, Current smoker or smoker who has quit within the last 15 years). If you are a smoker, remember, quitting is the single most powerful action that you can take to decrease your risk of lung cancer and other pulmonary, breathing related problems. We know quitting is hard, and we are here to help.  Please let us know if there is anything we can do to help you meet your goal of quitting. If you are a former smoker, congratulations. We are proud of you! Remain smoke free! Remember you can refer friends or family members through the number above.  We will screen them to make sure they meet criteria for the program. Thank you for helping us take better care of you by participating in Lung Screening.  Lung RADS Categories:  Lung RADS 1: no nodules  or definitely non-concerning nodules.  Recommendation is for a repeat annual scan in 12 months.  Lung RADS 2:  nodules that are non-concerning in appearance and behavior with a very low likelihood of becoming an active cancer. Recommendation is for a repeat annual scan in 12 months.  Lung RADS 3: nodules that are probably non-concerning , includes nodules with a low likelihood of becoming an active cancer.  Recommendation is for a 6-month repeat screening scan. Often noted after an upper respiratory illness. We will be in touch to make sure you have no questions, and to schedule your 6-month scan.  Lung RADS 4 A: nodules with concerning findings, recommendation is most often for a follow up scan in 3 months or additional testing based on our provider's assessment of the scan. We will be in touch to make sure you have no questions and to schedule the recommended 3 month follow up scan.  Lung RADS 4 B:  indicates findings that are concerning. We will be in touch with you to schedule additional diagnostic testing based on our provider's  assessment of the scan.   

## 2021-01-09 NOTE — Progress Notes (Signed)
Virtual Visit via Telephone Note  I connected with Douglas Barrera on 01/09/21 at  9:00 AM EDT by telephone and verified that I am speaking with the correct person using two identifiers.  Location: Patient: At home Provider: [place], [place], [place], [address]    I discussed the limitations, risks, security and privacy concerns of performing an evaluation and management service by telephone and the availability of in person appointments. I also discussed with the patient that there may be a patient responsible charge related to this service. The patient expressed understanding and agreed to proceed.   Shared Decision Making Visit Lung Cancer Screening Program [phone])   Eligibility:  Age 70 y.o.  Pack Years Smoking History Calculation 75 pack year smoking history (# packs/per year x # years smoked)  Recent History of coughing up blood  no  Unexplained weight loss? no ( >Than 15 pounds within the last 6 months )  Prior History Lung / other cancer no (Diagnosis within the last 5 years already requiring surveillance chest CT Scans).  Smoking Status Current Smoker  Former Smokers: Years since quit: NA  Quit Date: NA  Visit Components:  Discussion included one or more decision making aids. yes  Discussion included risk/benefits of screening. yes  Discussion included potential follow up diagnostic testing for abnormal scans. yes  Discussion included meaning and risk of over diagnosis. yes  Discussion included meaning and risk of False Positives. yes  Discussion included meaning of total radiation exposure. yes  Counseling Included:  Importance of adherence to annual lung cancer LDCT screening. yes  Impact of comorbidities on ability to participate in the program. yes  Ability and willingness to under diagnostic treatment. yes  Smoking Cessation Counseling:  Current Smokers:   Discussed importance of smoking cessation. yes  Information about tobacco  cessation classes and interventions provided to patient. yes  Patient provided with "ticket" for LDCT Scan. yes  Symptomatic Patient. no  Counseling NA  Diagnosis Code: Tobacco Use Z72.0  Asymptomatic Patient yes  Counseling (Intermediate counseling: > three minutes counseling) U0454  Former Smokers:   Discussed the importance of maintaining cigarette abstinence. yes  Diagnosis Code: Personal History of Nicotine Dependence. U98.119  Information about tobacco cessation classes and interventions provided to patient. Yes  Patient provided with "ticket" for LDCT Scan. yes  Written Order for Lung Cancer Screening with LDCT placed in Epic. Yes (CT Chest Lung Cancer Screening Low Dose W/O CM) [id number] Z12.2-Screening of respiratory organs Z87.891-Personal history of nicotine dependence  Pt. Is not currently interested in smoking cessation. I will send him a card with information on nicotine replacement therapy for any time in the future he may want to quit.   I have spent 25 minutes of face to face time with Mr. Hua discussing the risks and benefits of lung cancer screening. We viewed a power point together that explained in detail the above noted topics. We paused at intervals to allow for questions to be asked and answered to ensure understanding.We discussed that the single most powerful action that he can take to decrease his risk of developing lung cancer is to quit smoking. We discussed whether or not he is ready to commit to setting a quit date. We discussed options for tools to aid in quitting smoking including nicotine replacement therapy, non-nicotine medications, support groups, Quit Smart classes, and behavior modification. We discussed that often times setting smaller, more achievable goals, such as eliminating 1 cigarette a day for a week  and then 2 cigarettes a day for a week can be helpful in slowly decreasing the number of cigarettes smoked. This allows for a sense of  accomplishment as well as providing a clinical benefit. I gave him the " Be Stronger Than Your Excuses" card with contact information for community resources, classes, free nicotine replacement therapy, and access to mobile apps, text messaging, and on-line smoking cessation help. I have also given him my card and contact information in the event he needs to contact me. We discussed the time and location of the scan, and that either Doroteo Glassman RN or I will call with the results within 24-48 hours of receiving them. I have offered him  a copy of the power point we viewed  as a resource in the event they need reinforcement of the concepts we discussed today in the office. The patient verbalized understanding of all of  the above and had no further questions upon leaving the office. They have my contact information in the event they have any further questions.  I spent 3 minutes counseling on smoking cessation and the health risks of continued tobacco abuse.  I explained to the patient that there has been a high incidence of coronary artery disease noted on these exams. I explained that this is a non-gated exam therefore degree or severity cannot be determined. This patient is currently on statin therapy. I have asked the patient to follow-up with their PCP regarding any incidental finding of coronary artery disease and management with diet or medication as their PCP  feels is clinically indicated. The patient verbalized understanding of the above and had no further questions upon completion of the visit.      Magdalen Spatz, NP 01/09/2021

## 2021-01-15 ENCOUNTER — Ambulatory Visit: Payer: Medicare Other | Admitting: Internal Medicine

## 2021-01-17 NOTE — Telephone Encounter (Signed)
BI please advise.  Thanks  Dr. Valeta Harms: I just received a summons for jury duty. I am going to ask for an exemption because of my oxygen.  I am not sure if I was seated there would be anywhere for me to plug my machine. It says I need a note from my doctor to send in with the form. Can you please write me a note and mail to me. Thanks in advance.  Douglas Barrera

## 2021-01-17 NOTE — Telephone Encounter (Signed)
Please advise if you are ok with [place] writting letter for patient to be excused from jury duty.

## 2021-01-18 NOTE — Progress Notes (Signed)
Please call patient and let them  know their  low dose Ct was read as a Lung RADS 2: nodules that are benign in appearance and behavior with a very low likelihood of becoming a clinically active cancer due to size or lack of growth. Recommendation per radiology is for a repeat LDCT in 12 months. .Please let them  know we will order and schedule their  annual screening scan for 12/2021. Please let them  know there was notation of CAD on their  scan.  Please remind the patient  that this is a non-gated exam therefore degree or severity of disease  cannot be determined. Please have them  follow up with their PCP regarding potential risk factor modification, dietary therapy or pharmacologic therapy if clinically indicated. Pt.  is  currently on statin therapy. Please place order for annual  screening scan for  12/2021 and fax results to PCP. Thanks so much. 

## 2021-01-21 ENCOUNTER — Encounter: Payer: Self-pay | Admitting: *Deleted

## 2021-01-21 NOTE — Telephone Encounter (Signed)
Jury duty letter completed and placed in Dr. Juline Patch box to be signed 01/29/21, when Dr. Valeta Harms returns to office. Patient request jury letter be mailed to him once signed.  Message routed to [facility], CMA to follow up

## 2021-01-24 ENCOUNTER — Encounter: Payer: Self-pay | Admitting: *Deleted

## 2021-01-24 DIAGNOSIS — Z87891 Personal history of nicotine dependence: Secondary | ICD-10-CM

## 2021-01-24 DIAGNOSIS — F17210 Nicotine dependence, cigarettes, uncomplicated: Secondary | ICD-10-CM

## 2021-02-04 ENCOUNTER — Telehealth: Payer: Self-pay | Admitting: Internal Medicine

## 2021-02-04 NOTE — Progress Notes (Signed)
  Chronic Care Management   Outreach Note  02/04/2021 Name: Douglas Barrera MRN: [id number] DOB: [birth year]  Referred by: [name], MD Reason for referral : No chief complaint on file.   An unsuccessful telephone outreach was attempted today. The patient was referred to the pharmacist for assistance with care management and care coordination.   Follow Up Plan:   Tatjana Dellinger Upstream Scheduler

## 2021-02-04 NOTE — Progress Notes (Signed)
  Chronic Care Management   Note  02/04/2021 Name: KHADIM LUNDBERG MRN: [id number] DOB: [birth year]  MARCELINO CAMPOS is a 70 y.o. year old male who is a primary care patient of [name], MD. I reached out to Lavell Islam by phone today in response to a referral sent by Mr. Kathe Mariner PCP, [name], MD.   Mr. Sposito was given information about Chronic Care Management services today including:  1. CCM service includes personalized support from designated clinical staff supervised by his physician, including individualized plan of care and coordination with other care providers 2. 24/7 contact phone numbers for assistance for urgent and routine care needs. 3. Service will only be billed when office clinical staff spend 20 minutes or more in a month to coordinate care. 4. Only one practitioner may furnish and bill the service in a calendar month. 5. The patient may stop CCM services at any time (effective at the end of the month) by phone call to the office staff.   Patient did not agree to enrollment in care management services and does not wish to consider at this time.  Follow up plan:   Tatjana Secretary/administrator

## 2021-02-22 ENCOUNTER — Other Ambulatory Visit: Payer: Self-pay | Admitting: Internal Medicine

## 2021-02-22 DIAGNOSIS — J449 Chronic obstructive pulmonary disease, unspecified: Secondary | ICD-10-CM

## 2021-02-22 DIAGNOSIS — J9611 Chronic respiratory failure with hypoxia: Secondary | ICD-10-CM

## 2021-04-19 NOTE — Progress Notes (Signed)
MEDICARE ANNUAL WELLNESS VISIT AND FOLLOW UP Assessment:   Douglas Barrera was seen today for follow-up and medicare wellness.  Diagnoses and all orders for this visit:  Annual Medicare Wellness Visit Due annually  Health maintenance reviewed  Labile hypertension Controlled off of medications Monitor blood pressure at home; call if consistently over 130/80 Continue DASH diet.   Reminder to go to the ER if any CP, SOB, nausea, dizziness, severe HA, changes vision/speech, left arm numbness and tingling and jaw pain.  Coronary atherosclerosis due to calcified coronary lesion Per CT, has seen Dr. Gwenlyn Found but declined further workup Continue ASA, discussed LDL goal <70 Denies angina sx Control blood pressure, cholesterol, glucose, increase exercise.   Aortic atherosclerosis ([place]) Per numerous CTs Control blood pressure, cholesterol, glucose, increase exercise.   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) On O2; monitor   Irritable bowel syndrome, unspecified type Add soluble fiber; monitor; recently fairly managed  Gastroesophageal reflux disease, esophagitis presence not specified Well managed on current medications Discussed diet, avoiding triggers and other lifestyle changes  Vitamin D deficiency At goal at recent check; continue to recommend supplementation for goal of 60-100 Defer vitamin D level  Smoker Rare, encouraged cessation; neg CT screening 12/2020  Other abnormal glucose (prediabetes) Discussed disease and risks Discussed diet/exercise, weight management  A1C  Medication management Each visit   Hyperlipidemia CAD by imaging; LDL goal <70, titrate statin as needed Continue low cholesterol diet and exercise.  Check lipid panel.   History of adenomatous polyp of colon UTD on colonoscopy; due 2025  Overweight (BMI 25.0-29.9) Continue to recommend diet  heavy in fruits and veggies and low in animal meats, cheeses, and dairy products, appropriate calorie intake Discuss exercise recommendations routinely Continue to monitor weight at each visit  Atypical lesion ? Keratotic lesion, possible SCC to left forearm, hx of BCC Recommended biopsy/resection, declines to schedule here or derm referral at this time due to upcoming travel. Emphasized to follow up if not improving or with any progression within 1-2 months.  Also ? Non-healing wound to right shin - he is unsure of duration, cover with bandage to avoid scratching, also recommend if not resolving within 1 month.    Orders Placed This Encounter  Procedures   CBC with Differential/Platelet   COMPLETE METABOLIC PANEL WITH GFR   Magnesium   Lipid panel   TSH   Hemoglobin A1c     Over 30 minutes of exam, counseling, chart review, and critical decision making was performed  Future Appointments  Date Time Provider [place]  10/24/2021  2:00 PM [name], MD [facility] None  04/29/2022 11:30 AM Liane Comber, NP [facility] None     Plan:   During the course of the visit the patient was educated and counseled about appropriate screening and preventive services including:   Pneumococcal vaccine  Influenza vaccine Prevnar 13 Td vaccine Screening electrocardiogram Colorectal cancer screening Diabetes screening Glaucoma screening Nutrition counseling    Subjective:  Douglas Barrera is a 70 y.o. male who presents for Medicare Annual Wellness Visit and 3 month follow up for HTN, hyperlipidemia, prediabetes, and vitamin D Def.   Patient has severe COPD consequent of years of smoking, controlled with MDI  & nebulized meds (perforomist) and guaifenesin. He is followed by Dr Carney Corners (Pulmonary), on continuous Oxygen therapy.  He moitors O2 sats and regulate his O2 flow rate between 2 - 3 l/min  to keep his O2 sats in the low 90's range. He does admit still occasionally  "sneaking " a cigar (outside away from his O2). He is on neb med.  Ct lung in 01/20/2021 showed no concerning nodules, did show emphysematous changes and aortic atherosclerosis,   Trazodone 150 mg daily at night for sleep and endorses restorative sleep.   BMI is Body mass index is 24.61 kg/m., he has been working on diet, pushes fresh vegetables and fruits, avoids processed foods, cooks at home most of the time.  Walks 15-20, 3-4 days a week.  Wt Readings from Last 3 Encounters:  04/22/21 145 lb 9.6 oz (66 kg)  01/02/21 147 lb 6.4 oz (66.9 kg)  10/18/20 148 lb 2.4 oz (67.2 kg)   Previous CTs have shown 3 vessel CAD. He was evaluated by Dr. Gwenlyn Found in 2020 for this who had planned coronary calcium study but was having problems with insurance, then deferred due to pandemic. Denies sx, declines referral back.   His blood pressure has been controlled at home, today their BP is BP: 110/70  He does workout. He denies chest pain, shortness of breath (none if he stays on O2) dizziness.   He is on cholesterol medication (rosuvastatin MWF) and denies myalgias. His cholesterol is not at goal LDL <70. The cholesterol last visit was:   Lab Results  Component Value Date   CHOL 140 10/16/2020   HDL 46 10/16/2020   LDLCALC 75 10/16/2020   TRIG 111 10/16/2020   CHOLHDL 3.0 10/16/2020   He has not been working on diet and exercise for prediabetes, and denies increased appetite, nausea, paresthesia of the feet, polydipsia, polyuria and visual disturbances. Last A1C in the office was:  Lab Results  Component Value Date   HGBA1C 5.8 (H) 10/16/2020   Last GFR Lab Results  Component Value Date   GFRNONAA 83 10/16/2020    Patient is on Vitamin D supplement and at goal at last check:    Lab Results  Component Value Date   VD25OH 78 10/16/2020      Medication Review:   Current Outpatient Medications (Cardiovascular):    rosuvastatin (CRESTOR) 5 MG tablet, Take 1 tab three days a week (WMF) in the  evening for cholesterol.  Current Outpatient Medications (Respiratory):    budesonide (PULMICORT) 0.5 MG/2ML nebulizer solution, Take 2 mLs (0.5 mg total) by nebulization 2 (two) times daily.   diphenhydrAMINE (BENADRYL) 25 MG tablet, Take 50 mg by mouth daily.    fluticasone (FLONASE) 50 MCG/ACT nasal spray, Place 2 sprays into both nostrils daily. (Patient taking differently: Place 2 sprays into both nostrils every evening.)   formoterol (PERFOROMIST) 20 MCG/2ML nebulizer solution, USE 1 VIAL VIA NEBULIER TWO TIMES A DAY   guaifenesin (HUMIBID E) 400 MG TABS tablet, Take 800 mg by mouth daily.    revefenacin (YUPELRI) 175 MCG/3ML nebulizer solution, Take 3 mLs (175 mcg total) by nebulization daily.   albuterol (VENTOLIN HFA) 108 (90 Base) MCG/ACT inhaler, Inhale 1 to 2 puffs 4 x day or every 4 hours to rescue asthma  Current Outpatient Medications (Analgesics):    aspirin 81 MG tablet, Take 81 mg by mouth every evening.    Current Outpatient Medications (Other):    Cholecalciferol (VITAMIN D3) 5000 units CAPS, Take 10,000 Units by mouth daily.    traZODone (DESYREL) 150 MG tablet, TAKE 1 TABLET 1 HOUR BEFORE BEDTIME AS NEEDED FOR SLEEP   trolamine salicylate (ASPERCREME) 10 % cream, Apply 1 application topically  as needed for muscle pain.   zinc gluconate 50 MG tablet, Take 50 mg by mouth daily.  Allergies: Allergies  Allergen Reactions   Niacin And Related Other (See Comments)    Unknown Childhood reaction.    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  Screening Tests Immunization History  Administered Date(s) Administered   Fluad Quad(high Dose 65+) 06/13/2020   Influenza Split 06/20/2013, 06/20/2014, 06/28/2015   Influenza, High Dose Seasonal PF 06/17/2017, 05/24/2018, 06/14/2018, 06/29/2019   Influenza,inj,quad, With Preservative 07/21/2016   Influenza-Unspecified 06/17/2017   PFIZER(Purple Top)SARS-COV-2 Vaccination 09/26/2019, 10/17/2019, 06/19/2020   PPD Test 06/20/2014, 06/28/2015, 07/21/2016   Pneumococcal Conjugate-13 06/20/2014   Pneumococcal Polysaccharide-23 04/30/2009, 08/12/2017   Tdap 09/01/2009   Preventative care: Last colonoscopy: 10/2018, due 2025  Smoker - CT lung: 01/09/2021  Prior vaccinations: TD or Tdap: 2011, will defer due to cost   Influenza: 2021 at pharmacy  Pneumococcal: 2018 Prevnar13: 2015 Shingles/Zostavax: ? Had in 2017, unsure, declines shingrix Covid 19: 2/2, 2021 + booster 07/2020  Names of Other Physician/Practitioners you currently use: 1. [facility] here for primary care 2. [place] [facility], Dr. Quay Burow, eye doctor, last visit 2022, glasses, will be seeing new doctor  3.  dentist, last visit remotely, has full dentures  Patient Care Team: [name], MD as PCP - General (Internal Medicine) Ladene Artist, MD as Consulting Physician (Gastroenterology) Garner Nash, DO as Consulting Physician (Pulmonary Disease)  Surgical: He  has a past surgical history that includes Testicle surgery; Excision basal cell carcinoma (2008); and Colonoscopy with propofol (N/A, 10/26/2018). Family His family history includes Breast cancer in his mother; Stomach cancer in his mother. Social history  He reports that he quit smoking about 2 years ago. His smoking use included cigars and cigarettes. He started smoking about 52 years ago. He has a 98.00 pack-year smoking history. He has never used smokeless tobacco. He reports that he does  not drink alcohol and does not use drugs.  MEDICARE WELLNESS OBJECTIVES: Physical activity: Current Exercise Habits: The patient does not participate in regular exercise at present, Exercise limited by: None identified Cardiac risk factors: Cardiac Risk Factors include: advanced age (>4mn, >>41women);dyslipidemia;hypertension;male gender;sedentary lifestyle;smoking/ tobacco exposure Depression/mood screen:   [facility] 04/22/2021  Decreased Interest 0  Down, Depressed, Hopeless 0  PHQ - 2 Score 0  Altered sleeping -  Tired, decreased energy -  Change in appetite -  Feeling bad or failure about yourself  -  Trouble concentrating -  Moving slowly or fidgety/restless -  Suicidal thoughts -  PHQ-9 Score -  Difficult doing work/chores -    ADLs:  In your present state of health, do you have any difficulty performing the following activities: 04/22/2021 10/16/2020  Hearing? N N  Vision? N N  Difficulty concentrating or making decisions? N N  Walking or climbing stairs? N N  Dressing or bathing? N N  Doing errands, shopping? N N  Some recent data might be hidden     Cognitive Testing  Alert? Yes  Normal Appearance?Yes  Oriented to person? Yes  Place? Yes   Time? Yes  Recall of three objects?  Yes  Can perform simple calculations? Yes  Displays appropriate judgment?Yes  Can read the correct time from a watch face?Yes  EOL planning: Does Patient Have a Medical Advance Directive?: No Would patient like information on creating a medical advance directive?: No - Patient declined   Objective:   Today's Vitals   04/22/21 1144  BP: 110/70  Pulse: 77  Resp: 17  Temp: (!) 97.5 F (36.4 C)  SpO2: 93%  Weight: 145 lb 9.6 oz (66 kg)  Height: 5' 4.5" (1.638 m)    Body mass index is 24.61 kg/m.  General Appearance:  in no apparent distress. Eyes: PERRLA, EOMs, conjunctiva no swelling or erythema Sinuses: No Frontal/maxillary tenderness ENT/Mouth: Ext aud canals  clear, TMs without erythema, bulging. No erythema, swelling, or exudate on post pharynx. Hearing normal.  Neck: Supple, thyroid normal.  Respiratory: Respiratory effort normal, Decreased breath sounds throughout without rales, rhonchi, wheezing or stridor. Patient on [place] with 3 L. Cardio: RRR with no MRGs, distant heart sounds. Brisk peripheral pulses with mild edema bilaterally Abdomen: Soft, + BS.  Non tender, no guarding, rebound, hernias, masses. Lymphatics: Non tender without lymphadenopathy.  Musculoskeletal: Full ROM, 4/5 strength diffuse decreased muscle tone, Normal gait Skin: Warm, dry without rashes, lesions, ecchymosis.  Neuro: Cranial nerves intact. No cerebellar symptoms.  Psych: Awake and oriented X 3, normal affect, Insight and Judgment appropriate.  Skin: warm/dry; intact; fragile; scattered small ecchymoses to upper extremities. Left forearm with keratotic lesion, raised, approx 0.8 cm area with erythematous base; R shin with 1 cm scabbed, faintly erythematous borders.    Medicare Attestation I have personally reviewed: The patient's medical and social history Their use of alcohol, tobacco or illicit drugs Their current medications and supplements The patient's functional ability including ADLs,fall risks, home safety risks, cognitive, and hearing and visual impairment Diet and physical activities Evidence for depression or mood disorders  The patient's weight, height, BMI, and visual acuity have been recorded in the chart.  I have made referrals, counseling, and provided education to the patient based on review of the above and I have provided the patient with a written personalized care plan for preventive services.     Izora Ribas, NP   04/22/2021

## 2021-04-22 ENCOUNTER — Other Ambulatory Visit: Payer: Self-pay

## 2021-04-22 ENCOUNTER — Ambulatory Visit (INDEPENDENT_AMBULATORY_CARE_PROVIDER_SITE_OTHER): Payer: Medicare Other | Admitting: Adult Health

## 2021-04-22 ENCOUNTER — Encounter: Payer: Self-pay | Admitting: Adult Health

## 2021-04-22 VITALS — BP 110/70 | HR 77 | Temp 97.5°F | Resp 17 | Ht 64.5 in | Wt 145.6 lb

## 2021-04-22 DIAGNOSIS — E559 Vitamin D deficiency, unspecified: Secondary | ICD-10-CM

## 2021-04-22 DIAGNOSIS — Z0001 Encounter for general adult medical examination with abnormal findings: Secondary | ICD-10-CM

## 2021-04-22 DIAGNOSIS — Z86010 Personal history of colonic polyps: Secondary | ICD-10-CM | POA: Diagnosis not present

## 2021-04-22 DIAGNOSIS — E782 Mixed hyperlipidemia: Secondary | ICD-10-CM | POA: Diagnosis not present

## 2021-04-22 DIAGNOSIS — R0989 Other specified symptoms and signs involving the circulatory and respiratory systems: Secondary | ICD-10-CM

## 2021-04-22 DIAGNOSIS — F17200 Nicotine dependence, unspecified, uncomplicated: Secondary | ICD-10-CM | POA: Diagnosis not present

## 2021-04-22 DIAGNOSIS — R7309 Other abnormal glucose: Secondary | ICD-10-CM

## 2021-04-22 DIAGNOSIS — K589 Irritable bowel syndrome without diarrhea: Secondary | ICD-10-CM | POA: Diagnosis not present

## 2021-04-22 DIAGNOSIS — K219 Gastro-esophageal reflux disease without esophagitis: Secondary | ICD-10-CM

## 2021-04-22 DIAGNOSIS — R6889 Other general symptoms and signs: Secondary | ICD-10-CM | POA: Diagnosis not present

## 2021-04-22 DIAGNOSIS — J9611 Chronic respiratory failure with hypoxia: Secondary | ICD-10-CM

## 2021-04-22 DIAGNOSIS — I2510 Atherosclerotic heart disease of native coronary artery without angina pectoris: Secondary | ICD-10-CM | POA: Diagnosis not present

## 2021-04-22 DIAGNOSIS — Z0000 Encounter for general adult medical examination without abnormal findings: Secondary | ICD-10-CM

## 2021-04-22 DIAGNOSIS — Z6825 Body mass index (BMI) 25.0-25.9, adult: Secondary | ICD-10-CM

## 2021-04-22 DIAGNOSIS — D492 Neoplasm of unspecified behavior of bone, soft tissue, and skin: Secondary | ICD-10-CM

## 2021-04-22 DIAGNOSIS — Z79899 Other long term (current) drug therapy: Secondary | ICD-10-CM | POA: Diagnosis not present

## 2021-04-22 DIAGNOSIS — I700 Atherosclerosis of aorta: Secondary | ICD-10-CM

## 2021-04-22 DIAGNOSIS — J449 Chronic obstructive pulmonary disease, unspecified: Secondary | ICD-10-CM

## 2021-04-22 MED ORDER — ALBUTEROL SULFATE HFA 108 (90 BASE) MCG/ACT IN AERS: INHALATION_SPRAY | RESPIRATORY_TRACT | 1 refills | Status: DC

## 2021-04-22 NOTE — Patient Instructions (Signed)
  [name] , Thank you for taking time to come for your Medicare Wellness Visit. I appreciate your ongoing commitment to your health goals. Please review the following plan we discussed and let me know if I can assist you in the future.   These are the goals we discussed:  Goals      LDL CALC < 70     Quit smoking / using tobacco        This is a list of the screening recommended for you and due dates:  Health Maintenance  Topic Date Due   COVID-19 Vaccine (4 - Booster for Pfizer series) 09/19/2020   Flu Shot  06/01/2021*   Tetanus Vaccine  04/22/2022*   Colon Cancer Screening  10/27/2023   Hepatitis C Screening: USPSTF Recommendation to screen - Ages 18-79 yo.  Completed   Pneumonia vaccines  Completed   HPV Vaccine  Aged Out   Zoster (Shingles) Vaccine  Discontinued  *Topic was postponed. The date shown is not the original due date.    Please cover lesion to shin and arm - can try putting some vaseline on arm and gentle exfoliation to see if this helps - but if not healing or getting bigger over next month would recommend biopsy/removal - can refer to skin center if needed

## 2021-04-23 ENCOUNTER — Other Ambulatory Visit: Payer: Self-pay | Admitting: Adult Health

## 2021-04-23 LAB — CBC WITH DIFFERENTIAL/PLATELET
Absolute Monocytes: 773 cells/uL (ref 200–950)
Basophils Absolute: 23 cells/uL (ref 0–200)
Basophils Relative: 0.3 %
Eosinophils Absolute: 53 cells/uL (ref 15–500)
Eosinophils Relative: 0.7 %
HCT: 44.6 % (ref 38.5–50.0)
Hemoglobin: 15.2 g/dL (ref 13.2–17.1)
Lymphs Abs: 1620 cells/uL (ref 850–3900)
MCH: 31.1 pg (ref 27.0–33.0)
MCHC: 34.1 g/dL (ref 32.0–36.0)
MCV: 91.4 fL (ref 80.0–100.0)
MPV: 10.7 fL (ref 7.5–12.5)
Monocytes Relative: 10.3 %
Neutro Abs: 5033 cells/uL (ref 1500–7800)
Neutrophils Relative %: 67.1 %
Platelets: 203 10*3/uL (ref 140–400)
RBC: 4.88 10*6/uL (ref 4.20–5.80)
RDW: 11.7 % (ref 11.0–15.0)
Total Lymphocyte: 21.6 %
WBC: 7.5 10*3/uL (ref 3.8–10.8)

## 2021-04-23 LAB — LIPID PANEL
Cholesterol: 144 mg/dL (ref <200)
HDL: 51 mg/dL (ref >=40)
LDL Cholesterol (Calc): 78 mg/dL (calc)
Non-HDL Cholesterol (Calc): 93 mg/dL (calc) (ref <130)
Total CHOL/HDL Ratio: 2.8 (calc) (ref <5.0)
Triglycerides: 72 mg/dL (ref <150)

## 2021-04-23 LAB — TSH: TSH: 1.73 mIU/L (ref 0.40–4.50)

## 2021-04-23 LAB — COMPLETE METABOLIC PANEL WITH GFR
AG Ratio: 2 (calc) (ref 1.0–2.5)
ALT: 15 U/L (ref 9–46)
AST: 17 U/L (ref 10–35)
Albumin: 4.5 g/dL (ref 3.6–5.1)
Alkaline phosphatase (APISO): 75 U/L (ref 35–144)
BUN: 21 mg/dL (ref 7–25)
CO2: 35 mmol/L — ABNORMAL HIGH (ref 20–32)
Calcium: 9.6 mg/dL (ref 8.6–10.3)
Chloride: 99 mmol/L (ref 98–110)
Creat: 1 mg/dL (ref 0.70–1.35)
Globulin: 2.3 g/dL (calc) (ref 1.9–3.7)
Glucose, Bld: 94 mg/dL (ref 65–99)
Potassium: 4.3 mmol/L (ref 3.5–5.3)
Sodium: 139 mmol/L (ref 135–146)
Total Bilirubin: 0.4 mg/dL (ref 0.2–1.2)
Total Protein: 6.8 g/dL (ref 6.1–8.1)
eGFR: 81 mL/min/{1.73_m2} (ref >=60)

## 2021-04-23 LAB — HEMOGLOBIN A1C
Hgb A1c MFr Bld: 5.6 % of total Hgb (ref <5.7)
Mean Plasma Glucose: 114 mg/dL
eAG (mmol/L): 6.3 mmol/L

## 2021-04-23 LAB — MAGNESIUM: Magnesium: 2.1 mg/dL (ref 1.5–2.5)

## 2021-04-23 MED ORDER — ROSUVASTATIN CALCIUM 5 MG PO TABS: ORAL_TABLET | ORAL | 3 refills | Status: DC

## 2021-05-14 ENCOUNTER — Other Ambulatory Visit: Payer: Self-pay | Admitting: Internal Medicine

## 2021-05-14 DIAGNOSIS — G4700 Insomnia, unspecified: Secondary | ICD-10-CM

## 2021-05-23 ENCOUNTER — Telehealth: Payer: Self-pay | Admitting: Pulmonary Disease

## 2021-05-23 ENCOUNTER — Other Ambulatory Visit: Payer: Self-pay

## 2021-05-23 DIAGNOSIS — J9611 Chronic respiratory failure with hypoxia: Secondary | ICD-10-CM

## 2021-05-23 DIAGNOSIS — J449 Chronic obstructive pulmonary disease, unspecified: Secondary | ICD-10-CM

## 2021-05-23 MED ORDER — YUPELRI 175 MCG/3ML IN SOLN
175.0000 ug | Freq: Every day | RESPIRATORY_TRACT | 11 refills | Status: DC
Start: 2021-05-23 — End: 2021-05-23

## 2021-05-23 MED ORDER — BUDESONIDE 0.5 MG/2ML IN SUSP: 0.5000 mg | Freq: Two times a day (BID) | RESPIRATORY_TRACT | 11 refills | Status: DC

## 2021-05-23 MED ORDER — YUPELRI 175 MCG/3ML IN SOLN: 175.0000 ug | Freq: Every day | RESPIRATORY_TRACT | 11 refills | Status: DC

## 2021-05-23 MED ORDER — FORMOTEROL FUMARATE 20 MCG/2ML IN NEBU
INHALATION_SOLUTION | RESPIRATORY_TRACT | 2 refills | Status: DC
Start: 2021-05-23 — End: 2022-03-25

## 2021-05-23 NOTE — Telephone Encounter (Signed)
Spoke with pt who states [employer] needs refills sent over for nebulizer medications. Refill request was sent over and [employer] called to verified that mediation would be sent to pt. Douglas Barrera states once refills were received pt's file would would be updated and medication would be sent out. Nothing further needed at this time.

## 2021-06-14 DIAGNOSIS — Z23 Encounter for immunization: Secondary | ICD-10-CM | POA: Diagnosis not present

## 2021-06-21 ENCOUNTER — Other Ambulatory Visit: Payer: Self-pay | Admitting: Internal Medicine

## 2021-06-21 MED ORDER — DEXAMETHASONE 4 MG PO TABS: ORAL_TABLET | ORAL | 1 refills | Status: DC

## 2021-07-10 ENCOUNTER — Other Ambulatory Visit: Payer: Self-pay

## 2021-07-10 ENCOUNTER — Ambulatory Visit (INDEPENDENT_AMBULATORY_CARE_PROVIDER_SITE_OTHER): Payer: Medicare Other | Admitting: Pulmonary Disease

## 2021-07-10 ENCOUNTER — Encounter: Payer: Self-pay | Admitting: Pulmonary Disease

## 2021-07-10 VITALS — BP 104/60 | HR 91 | Temp 97.7°F | Ht 64.0 in | Wt 140.6 lb

## 2021-07-10 DIAGNOSIS — R911 Solitary pulmonary nodule: Secondary | ICD-10-CM

## 2021-07-10 DIAGNOSIS — Z122 Encounter for screening for malignant neoplasm of respiratory organs: Secondary | ICD-10-CM

## 2021-07-10 DIAGNOSIS — J9611 Chronic respiratory failure with hypoxia: Secondary | ICD-10-CM

## 2021-07-10 DIAGNOSIS — J449 Chronic obstructive pulmonary disease, unspecified: Secondary | ICD-10-CM

## 2021-07-10 DIAGNOSIS — F17210 Nicotine dependence, cigarettes, uncomplicated: Secondary | ICD-10-CM | POA: Diagnosis not present

## 2021-07-10 NOTE — Patient Instructions (Signed)
Thank you for visiting Dr. Valeta Harms at [facility]. Today we recommend the following:  Keep triple therapy nebs  LDCT in may 2022  Return in about 6 months (around 01/07/2022) for with Eric Form, NP, or Dr. Valeta Harms.    Please do your part to reduce the spread of COVID-19.

## 2021-07-10 NOTE — Progress Notes (Signed)
Synopsis: Referred in September 2019 for severe COPD by [name], MD  Subjective:   PATIENT ID: Douglas Barrera GENDER: male DOB: 08/16/1951, MRN: [id number]  Chief Complaint  Patient presents with   Follow-up    COPD 3L Pulse // DME: [facility]     He was diagnosed with COPD about 3-4 years ago. He quit smoking a week ago after being placed on oxygen. He feels much better after being placed on O2. He has much more energy after start O2. He has never been hospitalized for an exacerbation. Smoked for 40 years, 1 ppd.  Patient denies weight loss, fevers, chills, night sweats.  He does have sputum production.  He is able to complete most of his activities of daily living.  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  He used to travel around to local bakery's as well as a crossed many states to teach institutions and bakeries how to bake certain tasks.  He is able to go to the grocery store, pushes on buggy as well as mow his grass while riding a lawnmower.  OV 07/07/2018: Patient been doing well since last office visit.  He is trying to obtain POC.  Insurance has required requalification for O2 needs.  Presents today for walking in the office for that.  He has been using his new nebulized medications.  He is not really sure he seen much difference but he does think that his functional status is good.  Today he plans to go home and mow the grass on riding mower.  He is able to go to the grocery store.  He still is able to work in his kitchen and bake.  Overall doing well.  OV 10/13/2018: This is a 70 year old with very severe COPD.  Overall has been doing well for the past several months.  He had a low-dose lung cancer screening CT which revealed coronary calcifications and no evidence or/concern of a underlying malignancy.  This is being done by his primary care provider."  Radiology recommended a one-year follow-up.  He does have recent evaluation by cardiology that plans a CT a coronary evaluation.  Overall he has been  doing well and has no significant complaints today.  He enjoys using his nebulizers.  He plans to pick a cherry pie this weekend for his wife/Valentine's Day.  As for his current respiratory symptoms he maintains with some dyspnea on exertion.  He does have a planned colonoscopy for next week.  OV 06/30/2019: Patient doing very well today he has very severe COPD at baseline.  He was on nebulized LAMA LABA however currently only using Perforomist and as needed duo nebs.  He is stable on 3 L nasal cannula.  Able to complete most of his activities of daily living.  States that he mowed the grass last week with no trouble.  Trying to be as active as possible.  He is planning a trip to go across the country driving.  He would like to attempt to be approved for a POC concentrator that would help his travel significantly.  Having a car adapter would make this travel much simpler.  He will also plan to travel with his oxygen tanks.  He has a SpO2 device that he checks his sats regularly.  Otherwise no significant change in his respiratory symptoms baseline.  OV 12/21/19: Here for follow up regarding COPD.  Overall no complaints today.  He is at his standard baseline of 3 L.  O2 sat 89% today in the  office.  He does check his sats regularly at home and usually there stable in the high 80s to low 90s.  He is able to complete all of his activities of daily living.  He has 2 new kittens in the home.  He does have some seasonal allergies that has been taking over-the-counter antihistamines for.  He does have some questions about his oxygen needs from [facility].  They requested a recertification.  Work on a ensure that he has this completed today in the office with no issues from home health agency.  OV 06/13/2020: Patient here today for follow-up regarding severe COPD.  He is currently only using Perforomist.  Has not continued use of his other triple therapy inhalers.  He did have an exacerbation 2 months ago was placed  on steroids by his primary care provider.  He is trying to stay active as he could.  His primary care provider recommended talking to me today about increasing his exercise options.  We also discussed pulmonary rehabilitation referral.  He is worried about having to drive all the way to [facility] for this if he can have something done in [place] that would make life simpler.  He has still been baking.  Recently picked a batch of [place] bars for a family get together.  From a respiratory standpoint his dyspnea on exertion is still present.  He feels like he is a little setback from where he was after his exacerbation 2 months ago not quite back at baseline.  He would like to become a little bit more active.  He does have sputum production clear whitish at times.  No other discoloration.  No fevers chills night sweats weight loss.  OV 01/02/2021: Here today for COPD follow up. No complaints today. Breathing remains stable. He is able to complete his activities of daily living. Mowing the grass. Stable using his nebulizers and he prefers using nebs over inhalers.   OV 07/10/2021: using triple therapy inhaler.  Here today for COPD follow-up.  Able to do his activities of daily living.  He plans to mow his grass this afternoon.  Prefers using nebulizers of her inhalers.  Doing very well with his current nebulizer regimen.   Past Medical History:  Diagnosis Date   COPD (chronic obstructive pulmonary disease) (HCC)    GERD (gastroesophageal reflux disease)    History of elevated lipids    Hyperlipidemia    IBS (irritable bowel syndrome)    Labile hypertension    patient is not on meds for hypertension   Prediabetes    Secondary polycythemia 05/25/2018   Tubular adenoma of colon    Vitamin D deficiency      Family History  Problem Relation Age of Onset   Breast cancer Mother    Stomach cancer Mother      Past Surgical History:  Procedure Laterality Date   BASAL CELL CARCINOMA EXCISION  2008    Left Jake   COLONOSCOPY WITH PROPOFOL N/A 10/26/2018   Procedure: COLONOSCOPY WITH PROPOFOL;  Surgeon: Ladene Artist, MD;  Location: [facility];  Service: Endoscopy;  Laterality: N/A;   TESTICLE SURGERY     testicle removed    Social History   Socioeconomic History   Marital status: Married    Spouse name: Not on file   Number of children: 0   Years of education: Not on file   Highest education level: Not on file  Occupational History   Occupation: retired  Tobacco Use  Smoking status: Former    Packs/day: 2.00    Years: 49.00    Pack years: 98.00    Types: 65, Cigarettes    Start date: 10/17/1968    Quit date: 08/18/2018    Years since quitting: 2.8   Smokeless tobacco: Never   Tobacco comments:    quit smoking cigarettes in Dec 2019 but do backslide occasional, but smokes 3-4 cigars a week. small sigar on occasion 10/13/18  Substance and Sexual Activity   Alcohol use: No    Alcohol/week: 0.0 standard drinks   Drug use: No   Sexual activity: Not on file  Other Topics Concern   Not on file  Social History Narrative   Not on file   Social Determinants of Health   Financial Resource Strain: Not on file  Food Insecurity: Not on file  Transportation Needs: Not on file  Physical Activity: Not on file  Stress: Not on file  Social Connections: Not on file  Intimate Partner Violence: Not on file     Allergies  Allergen Reactions   Niacin And Related Other (See Comments)    Unknown Childhood reaction.     Outpatient Medications Prior to Visit  Medication Sig Dispense Refill   albuterol (VENTOLIN HFA) 108 (90 Base) MCG/ACT inhaler Inhale 1 to 2 puffs 4 x day or every 4 hours to rescue asthma 18 g 1   aspirin 81 MG tablet Take 81 mg by mouth every evening.      budesonide (PULMICORT) 0.5 MG/2ML nebulizer solution Take 2 mLs (0.5 mg total) by nebulization 2 (two) times daily. 120 mL 11   Cholecalciferol (VITAMIN D3) 5000 units CAPS Take 10,000 Units by mouth  daily.      diphenhydrAMINE (BENADRYL) 25 MG tablet Take 50 mg by mouth daily.      fluticasone (FLONASE) 50 MCG/ACT nasal spray Place 2 sprays into both nostrils daily. (Patient taking differently: Place 2 sprays into both nostrils every evening.) 48 g 3   formoterol (PERFOROMIST) 20 MCG/2ML nebulizer solution USE 1 VIAL VIA NEBULIER TWO TIMES A DAY 360 mL 2   guaifenesin (HUMIBID E) 400 MG TABS tablet Take 800 mg by mouth daily.      revefenacin (YUPELRI) 175 MCG/3ML nebulizer solution Take 3 mLs (175 mcg total) by nebulization daily. 90 mL 11   rosuvastatin (CRESTOR) 5 MG tablet Take 1 tab four days a week (WMFSat) in the evening for cholesterol. 50 tablet 3   traZODone (DESYREL) 150 MG tablet TAKE 1 TABLET 1 HOUR BEFORE BEDTIME AS NEEDED FOR SLEEP 90 tablet 3   trolamine salicylate (ASPERCREME) 10 % cream Apply 1 application topically as needed for muscle pain.     zinc gluconate 50 MG tablet Take 50 mg by mouth daily.     dexamethasone (DECADRON) 4 MG tablet Take 1 tab 3 x day - 3 days, then 2 x day - 3 days, then 1 tab daily 20 tablet 1   No facility-administered medications prior to visit.    Review of Systems  Constitutional:  Negative for chills, fever, malaise/fatigue and weight loss.  HENT:  Negative for hearing loss, sore throat and tinnitus.   Eyes:  Negative for blurred vision and double vision.  Respiratory:  Positive for shortness of breath. Negative for cough, hemoptysis, sputum production, wheezing and stridor.   Cardiovascular:  Negative for chest pain, palpitations, orthopnea, leg swelling and PND.  Gastrointestinal:  Negative for abdominal pain, constipation, diarrhea, heartburn, nausea and vomiting.  Genitourinary:  Negative for dysuria, hematuria and urgency.  Musculoskeletal:  Negative for joint pain and myalgias.  Skin:  Negative for itching and rash.  Neurological:  Negative for dizziness, tingling, weakness and headaches.  Endo/Heme/Allergies:  Negative for  environmental allergies. Does not bruise/bleed easily.  Psychiatric/Behavioral:  Negative for depression. The patient is not nervous/anxious and does not have insomnia.   All other systems reviewed and are negative.   Objective:  Physical Exam Vitals reviewed.  Constitutional:      General: He is not in acute distress.    Appearance: He is well-developed.  HENT:     Head: Normocephalic and atraumatic.  Eyes:     General: No scleral icterus.    Conjunctiva/sclera: Conjunctivae normal.     Pupils: Pupils are equal, round, and reactive to light.  Neck:     Vascular: No JVD.     Trachea: No tracheal deviation.  Cardiovascular:     Rate and Rhythm: Normal rate and regular rhythm.     Heart sounds: Normal heart sounds. No murmur heard. Pulmonary:     Effort: Pulmonary effort is normal. No tachypnea, accessory muscle usage or respiratory distress.     Breath sounds: No stridor. No wheezing, rhonchi or rales.     Comments: Diminished breath sounds bilaterally Abdominal:     General: Bowel sounds are normal. There is no distension.     Palpations: Abdomen is soft.     Tenderness: There is no abdominal tenderness.  Musculoskeletal:        General: No tenderness.     Cervical back: Neck supple.  Lymphadenopathy:     Cervical: No cervical adenopathy.  Skin:    General: Skin is warm and dry.     Capillary Refill: Capillary refill takes less than 2 seconds.     Findings: No rash.  Neurological:     Mental Status: He is alert and oriented to person, place, and time.  Psychiatric:        Behavior: Behavior normal.     Vitals:   07/10/21 0911  BP: 104/60  Pulse: 91  Temp: 97.7 F (36.5 C)  TempSrc: Oral  SpO2: 95%  Weight: 140 lb 9.6 oz (63.8 kg)  Height: 5\' 4"  (1.626 m)   95% on 3 L nasal cannula BMI Readings from Last 3 Encounters:  07/10/21 24.13 kg/m  04/22/21 24.61 kg/m  01/02/21 24.91 kg/m   Wt Readings from Last 3 Encounters:  07/10/21 140 lb 9.6 oz (63.8 kg)   04/22/21 145 lb 9.6 oz (66 kg)  01/02/21 147 lb 6.4 oz (66.9 kg)     CBC    Component Value Date/Time   WBC 7.5 04/22/2021 1212   RBC 4.88 04/22/2021 1212   HGB 15.2 04/22/2021 1212   HGB 16.8 08/19/2018 1308   HCT 44.6 04/22/2021 1212   PLT 203 04/22/2021 1212   PLT 194 08/19/2018 1308   MCV 91.4 04/22/2021 1212   MCH 31.1 04/22/2021 1212   MCHC 34.1 04/22/2021 1212   RDW 11.7 04/22/2021 1212   LYMPHSABS 1,620 04/22/2021 1212   MONOABS 1.3 (H) 08/19/2018 1308   EOSABS 53 04/22/2021 1212   BASOSABS 23 04/22/2021 1212    Chest Imaging: 08/26/2017 CT chest lung cancer screening, LD CT Lung-RADS 1 radiology, recommending annual continued screening. Evidence of mild bronchial thickening, scattered areas of groundglass, areas of scarring. The patient's images have been independently reviewed by me.   09/15/2018: LDCT -  LUNG RADS 2S, with coronary calcifications  The patient's images have been independently reviewed by me.    11/01/2019 LDCT: LUNG RAD 2, continuing annual follow up, small subcentimeter nodule 47mm  The patient's images have been independently reviewed by me.       May 2022: LDCT lung rads 2 The patient's images have been independently reviewed by me.    Pulmonary Functions Testing Results: PFT Results Latest Ref Rng & Units 05/31/2018 03/24/2018  FVC-Pre L 1.83 1.72  FVC-Predicted Pre % 48 45  FVC-Post L 1.89 -  FVC-Predicted Post % 50 -  Pre FEV1/FVC % % 46 36  Post FEV1/FCV % % 34 -  FEV1-Pre L 0.84 0.61  FEV1-Predicted Pre % 30 22  FEV1-Post L 0.65 -  DLCO uncorrected ml/min/mmHg 8.83 7.09  DLCO UNC% % 34 27  DLCO corrected ml/min/mmHg 7.96 6.28  DLCO COR %Predicted % 31 24  DLVA Predicted % 52 49  TLC L - 5.85  TLC % Predicted % - 97  RV % Predicted % - 198    FeNO: None   Pathology: None   Echocardiogram: None   Heart Catheterization: None     Assessment & Plan:   Chronic hypoxemic respiratory failure (HCC)  COPD, very severe  (HCC)  Cigarette smoker  Encounter for screening for lung cancer  Left upper lobe pulmonary nodule  Moderate cigarette smoker (10-19 per day)   Discussion:  This is a 70 year old gentleman constant severe COPD, FEV1 22%, chronic hypoxemic respiratory failure on POC.  mMRC functional class I.  Able to complete all of his activities of daily living.  Plan: Continue triple therapy nebulizer with steroid, LABA and LAMA Continue oxygen supply orders as needed in the Flu shot this year. Continue annual screening with low-dose lung cancer screening CT. As needed albuterol. Return to clinic in 6 months or as needed for COPD follow-up.    Current Outpatient Medications:    albuterol (VENTOLIN HFA) 108 (90 Base) MCG/ACT inhaler, Inhale 1 to 2 puffs 4 x day or every 4 hours to rescue asthma, Disp: 18 g, Rfl: 1   aspirin 81 MG tablet, Take 81 mg by mouth every evening. , Disp: , Rfl:    budesonide (PULMICORT) 0.5 MG/2ML nebulizer solution, Take 2 mLs (0.5 mg total) by nebulization 2 (two) times daily., Disp: 120 mL, Rfl: 11   Cholecalciferol (VITAMIN D3) 5000 units CAPS, Take 10,000 Units by mouth daily. , Disp: , Rfl:    diphenhydrAMINE (BENADRYL) 25 MG tablet, Take 50 mg by mouth daily. , Disp: , Rfl:    fluticasone (FLONASE) 50 MCG/ACT nasal spray, Place 2 sprays into both nostrils daily. (Patient taking differently: Place 2 sprays into both nostrils every evening.), Disp: 48 g, Rfl: 3   formoterol (PERFOROMIST) 20 MCG/2ML nebulizer solution, USE 1 VIAL VIA NEBULIER TWO TIMES A DAY, Disp: 360 mL, Rfl: 2   guaifenesin (HUMIBID E) 400 MG TABS tablet, Take 800 mg by mouth daily. , Disp: , Rfl:    revefenacin (YUPELRI) 175 MCG/3ML nebulizer solution, Take 3 mLs (175 mcg total) by nebulization daily., Disp: 90 mL, Rfl: 11   rosuvastatin (CRESTOR) 5 MG tablet, Take 1 tab four days a week (WMFSat) in the evening for cholesterol., Disp: 50 tablet, Rfl: 3   traZODone (DESYREL) 150 MG tablet, TAKE 1  TABLET 1 HOUR BEFORE BEDTIME AS NEEDED FOR SLEEP, Disp: 90 tablet, Rfl: 3   trolamine salicylate (ASPERCREME) 10 % cream, Apply 1 application topically as needed for muscle pain., Disp: , Rfl:  zinc gluconate 50 MG tablet, Take 50 mg by mouth daily., Disp: , Rfl:    dexamethasone (DECADRON) 4 MG tablet, Take 1 tab 3 x day - 3 days, then 2 x day - 3 days, then 1 tab daily, Disp: 20 tablet, Rfl: 1   Garner Nash, DO [facility] 07/10/2021 9:25 AM

## 2021-09-16 ENCOUNTER — Encounter: Payer: Self-pay | Admitting: Internal Medicine

## 2021-09-16 ENCOUNTER — Other Ambulatory Visit: Payer: Self-pay | Admitting: Nurse Practitioner

## 2021-09-16 DIAGNOSIS — J449 Chronic obstructive pulmonary disease, unspecified: Secondary | ICD-10-CM

## 2021-09-16 MED ORDER — ALBUTEROL SULFATE HFA 108 (90 BASE) MCG/ACT IN AERS: INHALATION_SPRAY | RESPIRATORY_TRACT | 1 refills | Status: DC

## 2021-10-23 ENCOUNTER — Encounter: Payer: Self-pay | Admitting: Internal Medicine

## 2021-10-23 NOTE — Progress Notes (Signed)
Annual  Screening/Preventative Visit  & Comprehensive Evaluation & Examination  Future Appointments  Date Time Provider Department  10/24/2021  2:00 PM [name], MD [facility]  04/29/2022 11:30 AM Liane Comber, NP [facility]  10/30/2022  2:00 PM [name], MD [facility]            This very nice 71 y.o.  MWM presents for a Screening /Preventative Visit & comprehensive evaluation and management of multiple medical co-morbidities.  Patient has been followed for HTN, HLD, , COPD, Prediabetes and Vitamin D Deficiency.  Chest CT scan uin May 2022 showed Aortic Atherosclerosis.  Patient has severe O2 dependent COPD followed by Dr Valeta Harms.        Labile HTN predates circa 1997. Patient's BP has been controlled at home.  Today's BP is at goal - 120/76. Patient denies any cardiac symptoms as chest pain, palpitations, shortness of breath, dizziness or ankle swelling.       Patient's hyperlipidemia is controlled with diet and Rosuvastatin. Patient denies myalgias or other medication SE's. Last lipids were at goal :  Lab Results  Component Value Date   CHOL 144 04/22/2021   HDL 51 04/22/2021   LDLCALC 78 04/22/2021   TRIG 72 04/22/2021   CHOLHDL 2.8 04/22/2021         Patient has hx/o prediabetes (A1c 6.0% /2010) and patient denies reactive hypoglycemic symptoms, visual blurring, diabetic polys or paresthesias. Last A1c was  normal & at goal :   Lab Results  Component Value Date   HGBA1C 5.6 04/22/2021          Finally, patient has history of Vitamin D Deficiency ("24" /2008) and last vitamin D was at goal :   Lab Results  Component Value Date   VD25OH 78 10/16/2020     Current Outpatient Medications on File Prior to Visit  Medication Sig   albuterol HFA inhaler Inhale 1 to 2 puffs 4 x day or every 4 hours to rescue asthma   aspirin 81 MG tablet Take every evening.    budesonide (PULMICORT) 0.5 MG/2ML  Take 2 mLs  by neb 2  times daily.   VITAMIN D 5000 units  CAPS Take 10,000 Units by mouth daily.    diphenhydrAMINE  25 MG tablet Take 50 mg by mouth daily.    FLONASE  nasal spray Place 2 sprays into both nostrils daily. (Patient taking differently: Place 2 sprays into both nostrils every evening.)   formoterol (PERFOROMIST) 20 MC/2ML  USE 1 VIAL VIA NEBULIER 2 x / DAY   guaifenesin 400 MG TABS tablet Take 800 mg daily.    YUPELRI 175 MCG/3ML neb soln Take 3 mLs (175 mcg total) by nebdaily.   rosuvastatin 5 MG tablet Take 1 tab four days a week (WMFSat) in the evening for cholesterol.   traZODone 150 MG tablet TAKE 1 TABLET 1 HOUR BEFORE BEDTIME AS NEEDED FOR SLEEP   ASPERCREME 10 % cream Apply as needed for muscle pain.   zinc  50 MG tablet Take  daily.      Allergies  Allergen Reactions   Niacin And Related Other (See Comments)    Unknown Childhood reaction.     Past Medical History:  Diagnosis Date   COPD (chronic obstructive pulmonary disease) (HCC)    GERD (gastroesophageal reflux disease)    History of elevated lipids    Hyperlipidemia    IBS (irritable bowel syndrome)    Labile hypertension    patient is not on meds  for hypertension   Prediabetes    Secondary polycythemia 05/25/2018   Tubular adenoma of colon    Vitamin D deficiency      Health Maintenance  Topic Date Due   COVID-19 Vaccine (4 - Booster for Pfizer series) 08/14/2020   TETANUS/TDAP  04/22/2022 (Originally 09/02/2019)   Pneumonia Vaccine 48+ Years old  Completed   INFLUENZA VACCINE  Completed   Hepatitis C Screening  Completed   HPV VACCINES  Aged Out   Zoster Vaccines- Shingrix  Discontinued     Immunization History  Administered Date(s) Administered   Fluad Quad(high Dose ) 06/13/2020   Influenza Split 06/20/2013, 06/20/2014, 06/28/2015   Influenza, High Dose  06/17/2017, 05/24/2018, 06/14/2018, 06/29/2019   Influenza,inj,quad 07/21/2016   Influenza 06/17/2017   PFIZER SARS-COV-2 Vacc 09/26/2019, 10/17/2019, 06/19/2020   PPD Test 06/20/2014,  06/28/2015, 07/21/2016   Pneumococcal -13 06/20/2014   Pneumococcal -23 04/30/2009, 08/12/2017   Tdap 09/01/2009    Last Colon - 10/26/2018 - Dr Fuller Plan  - Recc repeat in 5 years due Feb 2025  Past Surgical History:  Procedure Laterality Date   BASAL CELL CARCINOMA EXCISION  2008   Left [place]   COLONOSCOPY WITH PROPOFOL N/A 10/26/2018   COLONOSCOPY WITH PROPOFOL;  Ladene Artist, MD   TESTICLE SURGERY     testicle removed     Family History  Problem Relation Age of Onset   Breast cancer Mother    Stomach cancer Mother      Social History   Tobacco Use   Smoking status: Former    Packs/day: 2.00    Years: 49.00    Pack years: 98.00    Types: Cigars, Cigarettes    Start date: 10/17/1968    Quit date: 08/18/2018    Years since quitting: 3.1   Smokeless tobacco: Never   Tobacco comments:    quit smoking cigarettes in Dec 2019 but do backslide occasional, but smokes 3-4 cigars a week. small sigar on occasion 10/13/18  Substance Use Topics   Alcohol use: No    Alcohol/week: 0.0 standard drinks   Drug use: No      ROS Constitutional: Denies fever, chills, weight loss/gain, headaches, insomnia,  night sweats or change in appetite. Does c/o fatigue. Eyes: Denies redness, blurred vision, diplopia, discharge, itchy or watery eyes.  ENT: Denies discharge, congestion, post nasal drip, epistaxis, sore throat, earache, hearing loss, dental pain, Tinnitus, Vertigo, Sinus pain or snoring.  Cardio: Denies chest pain, palpitations, irregular heartbeat, syncope, dyspnea, diaphoresis, orthopnea, PND, claudication or edema Respiratory: denies cough, dyspnea, DOE, pleurisy, hoarseness, laryngitis or wheezing.  Gastrointestinal: Denies dysphagia, heartburn, reflux, water brash, pain, cramps, nausea, vomiting, bloating, diarrhea, constipation, hematemesis, melena, hematochezia, jaundice or hemorrhoids Genitourinary: Denies dysuria, frequency, urgency, nocturia, hesitancy, discharge,  hematuria or flank pain Musculoskeletal: Denies arthralgia, myalgia, stiffness, Jt. Swelling, pain, limp or strain/sprain. Denies Falls. Skin: Denies puritis, rash, hives, warts, acne, eczema or change in skin lesion Neuro: No weakness, tremor, incoordination, spasms, paresthesia or pain Psychiatric: Denies confusion, memory loss or sensory loss. Denies Depression. Endocrine: Denies change in weight, skin, hair change, nocturia, and paresthesia, diabetic polys, visual blurring or hyper / hypo glycemic episodes.  Heme/Lymph: No excessive bleeding, bruising or enlarged lymph nodes.   Physical Exam  BP 120/76    Pulse 90    Temp 97.9 F (36.6 C)    Resp 16    Ht 5\' 4"  (1.626 m)    Wt 142 lb 6.4 oz (64.6 kg)  SpO2 92% Comment: On 2-3 liters O2   BMI 24.44 kg/m   General Appearance: Well nourished and well groomed and in no apparent distress.  Eyes: PERRLA, EOMs, conjunctiva no swelling or erythema, normal fundi and vessels. Sinuses: No frontal/maxillary tenderness ENT/Mouth: EACs patent / TMs  nl. Nares clear without erythema, swelling, mucoid exudates. Oral hygiene is good. No erythema, swelling, or exudate. Tongue normal, non-obstructing. Tonsils not swollen or erythematous. Hearing normal.  Neck: Supple, thyroid not palpable. No bruits, nodes or JVD. Respiratory: Respiratory effort normal.  BS  decreased, equal and clear bilateral without rales, rhonci, wheezing or stridor. Cardio: Heart sounds are normal with regular rate and rhythm and no murmurs, rubs or gallops. Peripheral pulses are normal and equal bilaterally without edema. No aortic or femoral bruits. Chest: symmetric with normal excursions and percussion.  Abdomen: Soft, with Nl bowel sounds. Nontender, no guarding, rebound, hernias, masses, or organomegaly.  Lymphatics: Non tender without lymphadenopathy.  Musculoskeletal: Full ROM all peripheral extremities, joint stability, 5/5 strength, and normal gait. Skin: Warm and dry  without rashes, lesions, cyanosis, clubbing or  ecchymosis.  Neuro: Cranial nerves intact, reflexes equal bilaterally. Normal muscle tone, no cerebellar symptoms. Sensation intact.  Pysch: Alert and oriented X 3 with normal affect, insight and judgment appropriate.   Assessment and Plan  1. Annual Preventative/Screening Exam    1. Essential hypertension  - EKG 12-Lead - Urinalysis, Routine w reflex microscopic - Microalbumin / creatinine urine ratio - CBC with Differential/Platelet - COMPLETE METABOLIC PANEL WITH GFR - Magnesium - TSH  2. Hyperlipidemia, mixed  - EKG 12-Lead - Lipid panel - TSH  3. Abnormal glucose  - EKG 12-Lead - Hemoglobin A1c - Insulin, random  4. Vitamin D deficiency  - VITAMIN D 25 Hydroxy  5. Gastroesophageal reflux disease,  - CBC with Differential/Platelet  6. Aortic atherosclerosis ([place]) by Chest CTscan on 01/09/2021  - EKG 12-Lead  7. BPH with obstruction/lower urinary tract symptoms  - PSA  8. Prostate cancer screening  - PSA  9. Screening for colorectal cancer  - POC Hemoccult Bld/Stl   10. Screening for ischemic heart disease  - EKG 12-Lead  11. Former smoker  - EKG 12-Lead  12. Screening for AAA (aortic abdominal aneurysm)   13. Medication management  - Urinalysis, Routine w reflex microscopic - Microalbumin / creatinine urine ratio - CBC with Differential/Platelet - COMPLETE METABOLIC PANEL WITH GFR - Magnesium - Lipid panel - TSH - Hemoglobin A1c - Insulin, random - VITAMIN D 25 Hydroxy          Patient was counseled in prudent diet, weight control to achieve/maintain BMI less than 25, BP monitoring, regular exercise and medications as discussed.  Discussed med effects and SE's. Routine screening labs and tests as requested with regular follow-up as recommended. Over 40 minutes of exam, counseling, chart review and high complex critical decision making was performed   Kirtland Bouchard, MD

## 2021-10-23 NOTE — Patient Instructions (Signed)

## 2021-10-24 ENCOUNTER — Other Ambulatory Visit: Payer: Self-pay

## 2021-10-24 ENCOUNTER — Ambulatory Visit (INDEPENDENT_AMBULATORY_CARE_PROVIDER_SITE_OTHER): Payer: Medicare Other | Admitting: Internal Medicine

## 2021-10-24 ENCOUNTER — Encounter: Payer: Self-pay | Admitting: Internal Medicine

## 2021-10-24 VITALS — BP 120/76 | HR 90 | Temp 97.9°F | Resp 16 | Ht 64.0 in | Wt 142.4 lb

## 2021-10-24 DIAGNOSIS — E782 Mixed hyperlipidemia: Secondary | ICD-10-CM

## 2021-10-24 DIAGNOSIS — F17200 Nicotine dependence, unspecified, uncomplicated: Secondary | ICD-10-CM

## 2021-10-24 DIAGNOSIS — Z136 Encounter for screening for cardiovascular disorders: Secondary | ICD-10-CM | POA: Diagnosis not present

## 2021-10-24 DIAGNOSIS — R7309 Other abnormal glucose: Secondary | ICD-10-CM | POA: Diagnosis not present

## 2021-10-24 DIAGNOSIS — Z79899 Other long term (current) drug therapy: Secondary | ICD-10-CM | POA: Diagnosis not present

## 2021-10-24 DIAGNOSIS — Z125 Encounter for screening for malignant neoplasm of prostate: Secondary | ICD-10-CM | POA: Diagnosis not present

## 2021-10-24 DIAGNOSIS — E559 Vitamin D deficiency, unspecified: Secondary | ICD-10-CM | POA: Diagnosis not present

## 2021-10-24 DIAGNOSIS — N401 Enlarged prostate with lower urinary tract symptoms: Secondary | ICD-10-CM

## 2021-10-24 DIAGNOSIS — R0989 Other specified symptoms and signs involving the circulatory and respiratory systems: Secondary | ICD-10-CM

## 2021-10-24 DIAGNOSIS — I10 Essential (primary) hypertension: Secondary | ICD-10-CM | POA: Diagnosis not present

## 2021-10-24 DIAGNOSIS — N138 Other obstructive and reflux uropathy: Secondary | ICD-10-CM

## 2021-10-24 DIAGNOSIS — Z87891 Personal history of nicotine dependence: Secondary | ICD-10-CM

## 2021-10-24 DIAGNOSIS — Z1211 Encounter for screening for malignant neoplasm of colon: Secondary | ICD-10-CM

## 2021-10-24 DIAGNOSIS — I700 Atherosclerosis of aorta: Secondary | ICD-10-CM

## 2021-10-24 DIAGNOSIS — K219 Gastro-esophageal reflux disease without esophagitis: Secondary | ICD-10-CM | POA: Diagnosis not present

## 2021-10-25 LAB — CBC WITH DIFFERENTIAL/PLATELET
Absolute Monocytes: 861 cells/uL (ref 200–950)
Basophils Absolute: 32 cells/uL (ref 0–200)
Basophils Relative: 0.4 %
Eosinophils Absolute: 47 cells/uL (ref 15–500)
Eosinophils Relative: 0.6 %
HCT: 46.4 % (ref 38.5–50.0)
Hemoglobin: 15.6 g/dL (ref 13.2–17.1)
Lymphs Abs: 1762 cells/uL (ref 850–3900)
MCH: 30.8 pg (ref 27.0–33.0)
MCHC: 33.6 g/dL (ref 32.0–36.0)
MCV: 91.7 fL (ref 80.0–100.0)
MPV: 10.5 fL (ref 7.5–12.5)
Monocytes Relative: 10.9 %
Neutro Abs: 5198 cells/uL (ref 1500–7800)
Neutrophils Relative %: 65.8 %
Platelets: 197 10*3/uL (ref 140–400)
RBC: 5.06 10*6/uL (ref 4.20–5.80)
RDW: 11.9 % (ref 11.0–15.0)
Total Lymphocyte: 22.3 %
WBC: 7.9 10*3/uL (ref 3.8–10.8)

## 2021-10-25 LAB — LIPID PANEL
Cholesterol: 154 mg/dL (ref <200)
HDL: 61 mg/dL (ref >=40)
LDL Cholesterol (Calc): 76 mg/dL (calc)
Non-HDL Cholesterol (Calc): 93 mg/dL (calc) (ref <130)
Total CHOL/HDL Ratio: 2.5 (calc) (ref <5.0)
Triglycerides: 88 mg/dL (ref <150)

## 2021-10-25 LAB — COMPLETE METABOLIC PANEL WITH GFR
AG Ratio: 1.9 (calc) (ref 1.0–2.5)
ALT: 14 U/L (ref 9–46)
AST: 17 U/L (ref 10–35)
Albumin: 4.7 g/dL (ref 3.6–5.1)
Alkaline phosphatase (APISO): 78 U/L (ref 35–144)
BUN: 21 mg/dL (ref 7–25)
CO2: 35 mmol/L — ABNORMAL HIGH (ref 20–32)
Calcium: 10.1 mg/dL (ref 8.6–10.3)
Chloride: 96 mmol/L — ABNORMAL LOW (ref 98–110)
Creat: 0.89 mg/dL (ref 0.70–1.35)
Globulin: 2.5 g/dL (calc) (ref 1.9–3.7)
Glucose, Bld: 83 mg/dL (ref 65–99)
Potassium: 4.9 mmol/L (ref 3.5–5.3)
Sodium: 138 mmol/L (ref 135–146)
Total Bilirubin: 0.4 mg/dL (ref 0.2–1.2)
Total Protein: 7.2 g/dL (ref 6.1–8.1)
eGFR: 93 mL/min/{1.73_m2} (ref >=60)

## 2021-10-25 LAB — URINALYSIS, ROUTINE W REFLEX MICROSCOPIC
Bilirubin Urine: NEGATIVE
Glucose, UA: NEGATIVE
Hgb urine dipstick: NEGATIVE
Ketones, ur: NEGATIVE
Leukocytes,Ua: NEGATIVE
Nitrite: NEGATIVE
Protein, ur: NEGATIVE
Specific Gravity, Urine: 1.019 (ref 1.001–1.035)
pH: 6 (ref 5.0–8.0)

## 2021-10-25 LAB — HEMOGLOBIN A1C
Hgb A1c MFr Bld: 5.6 % of total Hgb (ref <5.7)
Mean Plasma Glucose: 114 mg/dL
eAG (mmol/L): 6.3 mmol/L

## 2021-10-25 LAB — VITAMIN D 25 HYDROXY (VIT D DEFICIENCY, FRACTURES): Vit D, 25-Hydroxy: 79 ng/mL (ref 30–100)

## 2021-10-25 LAB — INSULIN, RANDOM: Insulin: 14 u[IU]/mL

## 2021-10-25 LAB — MICROALBUMIN / CREATININE URINE RATIO
Creatinine, Urine: 84 mg/dL (ref 20–320)
Microalb Creat Ratio: 4 mcg/mg creat (ref <30)
Microalb, Ur: 0.3 mg/dL

## 2021-10-25 LAB — TSH: TSH: 1.91 mIU/L (ref 0.40–4.50)

## 2021-10-25 LAB — MAGNESIUM: Magnesium: 2.1 mg/dL (ref 1.5–2.5)

## 2021-10-25 LAB — PSA: PSA: 0.62 ng/mL (ref <=4.00)

## 2022-01-08 ENCOUNTER — Encounter: Payer: Self-pay | Admitting: Pulmonary Disease

## 2022-01-08 ENCOUNTER — Ambulatory Visit (INDEPENDENT_AMBULATORY_CARE_PROVIDER_SITE_OTHER): Payer: Medicare Other | Admitting: Pulmonary Disease

## 2022-01-08 VITALS — BP 108/60 | HR 89 | Temp 98.0°F | Ht 65.0 in | Wt 140.6 lb

## 2022-01-08 DIAGNOSIS — J9611 Chronic respiratory failure with hypoxia: Secondary | ICD-10-CM

## 2022-01-08 DIAGNOSIS — L989 Disorder of the skin and subcutaneous tissue, unspecified: Secondary | ICD-10-CM

## 2022-01-08 DIAGNOSIS — Z122 Encounter for screening for malignant neoplasm of respiratory organs: Secondary | ICD-10-CM | POA: Diagnosis not present

## 2022-01-08 DIAGNOSIS — R911 Solitary pulmonary nodule: Secondary | ICD-10-CM

## 2022-01-08 DIAGNOSIS — J449 Chronic obstructive pulmonary disease, unspecified: Secondary | ICD-10-CM

## 2022-01-08 NOTE — Patient Instructions (Addendum)
Thank you for visiting Dr. Valeta Harms at [facility]. ?Today we recommend the following: ? ?LDCT tomorrow ?F/u with Dr. Melford Aase regarding arm  ?Continue current nebs  ? ?Return in about 6 months (around 07/11/2022), or if symptoms worsen or fail to improve, for with APP or Dr. Valeta Harms. ? ? ? ?Please do your part to reduce the spread of COVID-19.  ? ?

## 2022-01-08 NOTE — Progress Notes (Signed)
? ?Synopsis: Referred in September 2019 for severe COPD by [name], MD ? ?Subjective:  ? ?PATIENT ID: Douglas Barrera GENDER: male DOB: [birth year], MRN: [id number] ? ?Chief Complaint  ?Patient presents with  ? Follow-up  ?  Follow up. Patient has no complaints.   ? ? ?He was diagnosed with COPD about 3-4 years ago. He quit smoking a week ago after being placed on oxygen. He feels much better after being placed on O2. He has much more energy after start O2. He has never been hospitalized for an exacerbation. Smoked for 40 years, 1 ppd.  Patient denies weight loss, fevers, chills, night sweats.  He does have sputum production.  He is able to complete most of his activities of daily living.  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  He used to travel around to local bakery's as well as a crossed many states to teach institutions and bakeries how to bake certain tasks.  He is able to go to the grocery store, pushes on buggy as well as mow his grass while riding a lawnmower. ? ?OV 07/07/2018: Patient been doing well since last office visit.  He is trying to obtain POC.  Insurance has required requalification for O2 needs.  Presents today for walking in the office for that.  He has been using his new nebulized medications.  He is not really sure he seen much difference but he does think that his functional status is good.  Today he plans to go home and mow the grass on riding mower.  He is able to go to the grocery store.  He still is able to work in his kitchen and bake.  Overall doing well. ? ?OV 10/13/2018: This is a 71 year old with very severe COPD.  Overall has been doing well for the past several months.  He had a low-dose lung cancer screening CT which revealed coronary calcifications and no evidence or/concern of a underlying malignancy.  This is being done by his primary care provider."  Radiology recommended a one-year follow-up.  He does have recent evaluation by cardiology that plans a CT a coronary evaluation.  Overall he  has been doing well and has no significant complaints today.  He enjoys using his nebulizers.  He plans to pick a cherry pie this weekend for his wife/Valentine's Day.  As for his current respiratory symptoms he maintains with some dyspnea on exertion.  He does have a planned colonoscopy for next week. ? ?OV 06/30/2019: Patient doing very well today he has very severe COPD at baseline.  He was on nebulized LAMA LABA however currently only using Perforomist and as needed duo nebs.  He is stable on 3 L nasal cannula.  Able to complete most of his activities of daily living.  States that he mowed the grass last week with no trouble.  Trying to be as active as possible.  He is planning a trip to go across the country driving.  He would like to attempt to be approved for a POC concentrator that would help his travel significantly.  Having a car adapter would make this travel much simpler.  He will also plan to travel with his oxygen tanks.  He has a SpO2 device that he checks his sats regularly.  Otherwise no significant change in his respiratory symptoms baseline. ? ?OV 12/21/19: Here for follow up regarding COPD.  Overall no complaints today.  He is at his standard baseline of 3 L.  O2 sat 89% today in the  office.  He does check his sats regularly at home and usually there stable in the high 80s to low 90s.  He is able to complete all of his activities of daily living.  He has 2 new kittens in the home.  He does have some seasonal allergies that has been taking over-the-counter antihistamines for.  He does have some questions about his oxygen needs from [facility].  They requested a recertification.  Work on a ensure that he has this completed today in the office with no issues from home health agency. ? ?OV 06/13/2020: Patient here today for follow-up regarding severe COPD.  He is currently only using Perforomist.  Has not continued use of his other triple therapy inhalers.  He did have an exacerbation 2 months ago  was placed on steroids by his primary care provider.  He is trying to stay active as he could.  His primary care provider recommended talking to me today about increasing his exercise options.  We also discussed pulmonary rehabilitation referral.  He is worried about having to drive all the way to [facility] for this if he can have something done in [place] that would make life simpler.  He has still been baking.  Recently picked a batch of [place] bars for a family get together.  From a respiratory standpoint his dyspnea on exertion is still present.  He feels like he is a little setback from where he was after his exacerbation 2 months ago not quite back at baseline.  He would like to become a little bit more active.  He does have sputum production clear whitish at times.  No other discoloration.  No fevers chills night sweats weight loss. ? ?OV 01/02/2021: Here today for COPD follow up. No complaints today. Breathing remains stable. He is able to complete his activities of daily living. Mowing the grass. Stable using his nebulizers and he prefers using nebs over inhalers.  ? ?OV 07/10/2021: using triple therapy inhaler.  Here today for COPD follow-up.  Able to do his activities of daily living.  He plans to mow his grass this afternoon.  Prefers using nebulizers of her inhalers.  Doing very well with his current nebulizer regimen. ? ?OV 01/08/2022: Patient here today for 56-monthfollow-up of COPD management.  Currently using triple therapy nebulized treatments.  Doing really well with that.  Has no real symptoms at this time.  Is a is able to complete most of his activities of daily living.  His weight has been stable.  He has a skin lesion on his left anterior forearm that has been present for over a year.  I told him that he should probably follow-up with dermatology or his primary care provider regarding this as this could be the start of the skin cancer. ? ? ?Past Medical History:  ?Diagnosis Date  ? [place]  [place]   ? GERD (gastroesophageal reflux disease)   ? History of elevated lipids   ? Hyperlipidemia   ? IBS (irritable bowel syndrome)   ? Labile hypertension   ? patient is not on meds for hypertension  ? Prediabetes   ? Secondary polycythemia 05/25/2018  ? Tubular adenoma of colon   ? Vitamin D deficiency   ?  ? ?Family History  ?Problem Relation Age of Onset  ? Breast cancer Mother   ? Stomach cancer Mother   ?  ? ?Past Surgical History:  ?Procedure Laterality Date  ? BASAL CELL CARCINOMA EXCISION  2008  ? Left [place]  ? COLONOSCOPY WITH PROPOFOL N/A 10/26/2018  ? Procedure: COLONOSCOPY WITH PROPOFOL;  Surgeon: Ladene Artist, MD;  Location: [facility];  Service: Endoscopy;  Laterality: N/A;  ? TESTICLE SURGERY    ? testicle removed  ? ? ?Social History  ? ?Socioeconomic History  ? Marital status: Married  ?  Spouse name: Not on file  ? Number of children: 0  ? Years of education: Not on file  ? Highest education level: Not on file  ?Occupational History  ? Occupation: retired  ?Tobacco Use  ? Smoking status: Former  ?  Packs/day: 2.00  ?  Years: 49.00  ?  Pack years: 98.00  ?  Types: Cigars, Cigarettes  ?  Start date: 10/17/1968  ?  Quit date: 08/18/2018  ?  Years since quitting: 3.3  ? Smokeless tobacco: Never  ? Tobacco comments:  ?  quit smoking cigarettes in Dec 2019 but do backslide occasional, but smokes 3-4 cigars a week. small sigar on occasion 10/13/18  ?Substance and Sexual Activity  ? Alcohol use: No  ?  Alcohol/week: 0.0 standard drinks  ? Drug use: No  ? Sexual activity: Not on file  ?Other Topics Concern  ? Not on file  ?Social History Narrative  ? Not on file  ? ?Social Determinants of Health  ? ?Financial Resource Strain: Not on file  ?Food Insecurity: Not on file  ?Transportation Needs: Not on file  ?Physical Activity: Not on file  ?Stress: Not on file  ?Social Connections: Not on file  ?Intimate Partner Violence: Not on file  ?  ? ?Allergies  ?Allergen  Reactions  ? Niacin And Related Other (See Comments)  ?  Unknown Childhood reaction.  ?  ? ?Outpatient Medications Prior to Visit  ?Medication Sig Dispense Refill  ? albuterol (VENTOLIN HFA) 108 (90 Base) MCG

## 2022-01-09 ENCOUNTER — Ambulatory Visit (INDEPENDENT_AMBULATORY_CARE_PROVIDER_SITE_OTHER): Payer: Medicare Other

## 2022-01-09 DIAGNOSIS — F17210 Nicotine dependence, cigarettes, uncomplicated: Secondary | ICD-10-CM | POA: Diagnosis not present

## 2022-01-09 DIAGNOSIS — Z87891 Personal history of nicotine dependence: Secondary | ICD-10-CM

## 2022-01-15 ENCOUNTER — Telehealth: Payer: Self-pay | Admitting: Acute Care

## 2022-01-15 DIAGNOSIS — R911 Solitary pulmonary nodule: Secondary | ICD-10-CM

## 2022-01-15 DIAGNOSIS — F17210 Nicotine dependence, cigarettes, uncomplicated: Secondary | ICD-10-CM

## 2022-01-15 NOTE — Telephone Encounter (Addendum)
I have attempted to call the patient with the results of their  Low Dose CT Chest Lung cancer screening scan. There was no answer. I have left a VM requesting the patient call the office for the scan results. I included the office contact information in the message. We will await his return call. If no return call we will continue to call until patient is contacted.   ? ?LR 4A, needs 3 month follow up. ( 6.7 mm RUL nodule. ) ?

## 2022-01-16 NOTE — Telephone Encounter (Signed)
Spoke with pt and advised of CT results per Eric Form, NP. I advised pt that we will call him closer to 3 mths to get his follow up CT scan scheduled. Pt verbalized understanding and had no further questions. Ct results faxed to PCP. Order placed for 3 mth nodule f/u lung screening CT scan.

## 2022-02-12 DIAGNOSIS — R911 Solitary pulmonary nodule: Secondary | ICD-10-CM | POA: Insufficient documentation

## 2022-02-12 NOTE — Progress Notes (Signed)
3 MONTH FOLLOW UP Assessment:    Diagnoses and all orders for this visit:  Labile hypertension Controlled off of medications Monitor blood pressure at home; call if consistently over 130/80 Continue DASH diet.   Reminder to go to the ER if any CP, SOB, nausea, dizziness, severe HA, changes vision/speech, left arm numbness and tingling and jaw pain.  Coronary atherosclerosis due to calcified coronary lesion Per CT, has seen Dr. Gwenlyn Found but declined further workup Continue ASA, discussed LDL goal <70, titrate statin  Denies angina sx Control blood pressure, cholesterol, glucose, increase exercise.   Aortic atherosclerosis ([place]) Per numerous CTs Control blood pressure, cholesterol, glucose, increase exercise.   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; continue neb/inhalers; O2; counseled to stop smoking  Chronic hypoxemic respiratory failure ([place]) On O2; monitor   Vitamin D deficiency At goal at recent check; continue to recommend supplementation for goal of 60-100 Defer vitamin D level  Smoker Rare, encouraged cessation; pulm following lung cancer screening   Other abnormal glucose (hx of prediabetes) Recent A1Cs at goal Discussed diet/exercise, weight management  Defer A1C; check CMP  Medication management Each visit   Hyperlipidemia CAD by imaging; LDL goal <70, titrate statin as needed - anticipate pending lab results increasing rosuvastatin 5 mg from 4 days a week to daily  Continue low cholesterol diet and exercise.  Check lipid panel.   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 male who presents for 3 month follow up for HTN, hyperlipidemia, glucose management, and vitamin D Def.   Patient has severe COPD consequent of years of smoking, controlled with managed by triple therapy nebulized meds (perforomist, yupelri, budesonide) and guaifenesin.   He is followed by Dr. Carney Corners (Pulmonary), on continuous Oxygen therapy.  He moitors O2 sats and regulate his O2 flow rate between 2 - 3 l/min to keep his O2 sats in the low 90's range. He does admit still occasionally "sneaking " a cigar (outside away from his O2). He is on neb med.   Ct lung in 01/09/2022 showed suspicious 6.8 mm RUL nodule, pulm has 3 month follow up CT planned.   Trazodone 150 mg daily at night for sleep and endorses restorative sleep.   BMI is Body mass index is 23.36 kg/m., he has been working on diet, pushes fresh vegetables and fruits, avoids processed foods, cooks at home most of the time.  Walks 15-20, 3-4 days a week.  Wt Readings from Last 3 Encounters:  02/13/22 140 lb 6.4 oz (63.7 kg)  01/08/22 140 lb 9.6 oz (63.8 kg)  10/24/21 142 lb 6.4 oz (64.6 kg)   He has known 3 vessel CAD and severe aortic atherosclerosis per numerous CTs. He was evaluated by Dr. Gwenlyn Found in 2020 for this who had planned coronary calcium study but was having  problems with insurance, then deferred due to pandemic. Denies sx, declines referral back.   His blood pressure has been controlled at home, today their BP is BP: 114/66  He does workout. He denies chest pain, shortness of breath (none if he stays on O2), dizziness.   He is on cholesterol medication (rosuvastatin 4 days a week) and denies myalgias (denies hx of statin intolerance). His cholesterol  is not at goal LDL <70. The cholesterol last visit was:   Lab Results  Component Value Date   CHOL 154 10/24/2021   HDL 61 10/24/2021   LDLCALC 76 10/24/2021   TRIG 88 10/24/2021   CHOLHDL 2.5 10/24/2021   He has not been working on diet and exercise for hx of prediabetes, and denies increased appetite, nausea, paresthesia of the feet, polydipsia, polyuria and visual disturbances. Last A1C in the office was:  Lab Results  Component Value Date   HGBA1C 5.6 10/24/2021   Last GFR Lab Results  Component Value Date   EGFR 93 10/24/2021    Patient is on Vitamin D supplement and at goal at last check:    Lab Results  Component Value Date   VD25OH 79 10/24/2021      Medication Review:   Current Outpatient Medications (Cardiovascular):    rosuvastatin (CRESTOR) 5 MG tablet, Take 1 tab four days a week (WMFSat) in the evening for cholesterol.  Current Outpatient Medications (Respiratory):    albuterol (VENTOLIN HFA) 108 (90 Base) MCG/ACT inhaler, Inhale 1 to 2 puffs 4 x day or every 4 hours to rescue asthma   budesonide (PULMICORT) 0.5 MG/2ML nebulizer solution, Take 2 mLs (0.5 mg total) by nebulization 2 (two) times daily.   diphenhydrAMINE (BENADRYL) 25 MG tablet, Take 50 mg by mouth daily.    fluticasone (FLONASE) 50 MCG/ACT nasal spray, Place 2 sprays into both nostrils daily. (Patient taking differently: Place 2 sprays into both nostrils every evening.)   formoterol (PERFOROMIST) 20 MCG/2ML nebulizer solution, USE 1 VIAL VIA NEBULIER TWO TIMES A DAY   revefenacin (YUPELRI) 175 MCG/3ML nebulizer solution, Take 3 mLs (175 mcg total) by nebulization daily.  Current Outpatient Medications (Analgesics):    aspirin 81 MG tablet, Take 81 mg by mouth every evening.    Current Outpatient Medications (Other):    Cholecalciferol (VITAMIN D3) 5000 units CAPS, Take 10,000 Units by mouth daily.    traZODone (DESYREL) 150 MG tablet, TAKE 1 TABLET 1 HOUR BEFORE BEDTIME AS NEEDED FOR SLEEP    trolamine salicylate (ASPERCREME) 10 % cream, Apply 1 application topically as needed for muscle pain.   zinc gluconate 50 MG tablet, Take 50 mg by mouth daily.  Allergies: Allergies  Allergen Reactions   Niacin And Related Other (See Comments)    Unknown Childhood reaction.    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  Surgical: He  has a past surgical history that includes Testicle surgery; Excision basal cell carcinoma (2008); and Colonoscopy with propofol (N/A, 10/26/2018). Family His family history includes Breast cancer in his mother; Stomach cancer in his mother. Social history  He reports that he  quit smoking about 3 years ago. His smoking use included cigars and cigarettes. He started smoking about 53 years ago. He has a 98.00 pack-year smoking history. He has never used smokeless tobacco. He reports that he does not drink alcohol and does not use drugs.   Review of Systems  Constitutional:  Negative for malaise/fatigue and weight loss.  HENT:  Negative for hearing loss and tinnitus.   Eyes:  Negative for blurred vision and double vision.  Respiratory:  Positive for shortness of breath (exertional, chronic, stable). Negative for cough, sputum production and wheezing.   Cardiovascular:  Negative for chest pain, palpitations, orthopnea, claudication and leg swelling.  Gastrointestinal:  Negative for abdominal pain, blood in stool, constipation, diarrhea, heartburn,  melena, nausea and vomiting.  Genitourinary: Negative.   Musculoskeletal:  Negative for joint pain and myalgias.  Skin:  Negative for rash.  Neurological:  Negative for dizziness, tingling, sensory change, weakness and headaches.  Endo/Heme/Allergies:  Negative for polydipsia.  Psychiatric/Behavioral: Negative.    All other systems reviewed and are negative.     Objective:   Today's Vitals   02/13/22 0853  BP: 114/66  Pulse: 89  Temp: (!) 97.1 F (36.2 C)  SpO2: 90%  Weight: 140 lb 6.4 oz (63.7 kg)     Body mass index is 23.36 kg/m.  General Appearance:  in no apparent distress. Eyes: PERRLA, EOMs, conjunctiva no swelling or erythema Sinuses: No Frontal/maxillary tenderness ENT/Mouth: Ext aud canals clear, TMs without erythema, bulging. No erythema, swelling, or exudate on post pharynx. Hearing normal.  Neck: Supple, thyroid normal.  Respiratory: Respiratory effort normal, Decreased breath sounds throughout without rales, rhonchi, wheezing or stridor. Patient on [place] with 3 L. Cardio: RRR with no MRGs, distant heart sounds. Brisk peripheral pulses with mild edema bilaterally Abdomen: Soft, + BS.  Non tender, no guarding, rebound, hernias, masses. Lymphatics: Non tender without lymphadenopathy.  Musculoskeletal: Full ROM, 4/5 strength diffuse decreased muscle tone, Normal gait Skin: Warm, dry without rashes, lesions, ecchymosis.  Neuro: Cranial nerves intact. No cerebellar symptoms.  Psych: Awake and oriented X 3, normal affect, Insight and Judgment appropriate.  Skin: warm/dry; intact; fragile; scattered small ecchymoses to upper extremities. Left forearm with keratotic lesion, raised, approx 0.8 cm area with erythematous base; R shin with 1 cm scabbed, faintly erythematous borders.         Izora Ribas, NP   02/13/2022

## 2022-02-13 ENCOUNTER — Encounter: Payer: Self-pay | Admitting: Adult Health

## 2022-02-13 ENCOUNTER — Encounter: Payer: Self-pay | Admitting: Internal Medicine

## 2022-02-13 ENCOUNTER — Ambulatory Visit (INDEPENDENT_AMBULATORY_CARE_PROVIDER_SITE_OTHER): Payer: Medicare Other | Admitting: Adult Health

## 2022-02-13 VITALS — BP 114/66 | HR 89 | Temp 97.1°F | Wt 140.4 lb

## 2022-02-13 DIAGNOSIS — F17200 Nicotine dependence, unspecified, uncomplicated: Secondary | ICD-10-CM | POA: Diagnosis not present

## 2022-02-13 DIAGNOSIS — J9611 Chronic respiratory failure with hypoxia: Secondary | ICD-10-CM | POA: Diagnosis not present

## 2022-02-13 DIAGNOSIS — R0989 Other specified symptoms and signs involving the circulatory and respiratory systems: Secondary | ICD-10-CM

## 2022-02-13 DIAGNOSIS — E559 Vitamin D deficiency, unspecified: Secondary | ICD-10-CM

## 2022-02-13 DIAGNOSIS — Z6823 Body mass index (BMI) 23.0-23.9, adult: Secondary | ICD-10-CM

## 2022-02-13 DIAGNOSIS — R7309 Other abnormal glucose: Secondary | ICD-10-CM

## 2022-02-13 DIAGNOSIS — I2510 Atherosclerotic heart disease of native coronary artery without angina pectoris: Secondary | ICD-10-CM | POA: Diagnosis not present

## 2022-02-13 DIAGNOSIS — Z79899 Other long term (current) drug therapy: Secondary | ICD-10-CM

## 2022-02-13 DIAGNOSIS — Z6825 Body mass index (BMI) 25.0-25.9, adult: Secondary | ICD-10-CM

## 2022-02-13 DIAGNOSIS — E782 Mixed hyperlipidemia: Secondary | ICD-10-CM

## 2022-02-13 DIAGNOSIS — R911 Solitary pulmonary nodule: Secondary | ICD-10-CM | POA: Diagnosis not present

## 2022-02-13 DIAGNOSIS — I700 Atherosclerosis of aorta: Secondary | ICD-10-CM | POA: Diagnosis not present

## 2022-02-13 DIAGNOSIS — L989 Disorder of the skin and subcutaneous tissue, unspecified: Secondary | ICD-10-CM | POA: Diagnosis not present

## 2022-02-13 DIAGNOSIS — J449 Chronic obstructive pulmonary disease, unspecified: Secondary | ICD-10-CM

## 2022-02-14 ENCOUNTER — Encounter: Payer: Self-pay | Admitting: Internal Medicine

## 2022-02-14 ENCOUNTER — Other Ambulatory Visit: Payer: Self-pay | Admitting: Internal Medicine

## 2022-02-14 ENCOUNTER — Ambulatory Visit (INDEPENDENT_AMBULATORY_CARE_PROVIDER_SITE_OTHER): Payer: Medicare Other | Admitting: Internal Medicine

## 2022-02-14 VITALS — BP 108/68 | HR 88 | Temp 97.9°F | Resp 18 | Ht 65.0 in | Wt 140.0 lb

## 2022-02-14 DIAGNOSIS — L989 Disorder of the skin and subcutaneous tissue, unspecified: Secondary | ICD-10-CM | POA: Diagnosis not present

## 2022-02-14 DIAGNOSIS — C44722 Squamous cell carcinoma of skin of right lower limb, including hip: Secondary | ICD-10-CM

## 2022-02-14 DIAGNOSIS — C44629 Squamous cell carcinoma of skin of left upper limb, including shoulder: Secondary | ICD-10-CM | POA: Diagnosis not present

## 2022-02-14 DIAGNOSIS — R0989 Other specified symptoms and signs involving the circulatory and respiratory systems: Secondary | ICD-10-CM | POA: Diagnosis not present

## 2022-02-14 DIAGNOSIS — I2510 Atherosclerotic heart disease of native coronary artery without angina pectoris: Secondary | ICD-10-CM | POA: Diagnosis not present

## 2022-02-14 DIAGNOSIS — D485 Neoplasm of uncertain behavior of skin: Secondary | ICD-10-CM

## 2022-02-14 DIAGNOSIS — J9611 Chronic respiratory failure with hypoxia: Secondary | ICD-10-CM

## 2022-02-14 DIAGNOSIS — C44712 Basal cell carcinoma of skin of right lower limb, including hip: Secondary | ICD-10-CM | POA: Diagnosis not present

## 2022-02-14 LAB — CBC WITH DIFFERENTIAL/PLATELET
Absolute Monocytes: 717 cells/uL (ref 200–950)
Basophils Absolute: 28 cells/uL (ref 0–200)
Basophils Relative: 0.4 %
Eosinophils Absolute: 50 cells/uL (ref 15–500)
Eosinophils Relative: 0.7 %
HCT: 45.6 % (ref 38.5–50.0)
Hemoglobin: 15.2 g/dL (ref 13.2–17.1)
Lymphs Abs: 1335 cells/uL (ref 850–3900)
MCH: 31.3 pg (ref 27.0–33.0)
MCHC: 33.3 g/dL (ref 32.0–36.0)
MCV: 94 fL (ref 80.0–100.0)
MPV: 10.6 fL (ref 7.5–12.5)
Monocytes Relative: 10.1 %
Neutro Abs: 4970 cells/uL (ref 1500–7800)
Neutrophils Relative %: 70 %
Platelets: 202 10*3/uL (ref 140–400)
RBC: 4.85 10*6/uL (ref 4.20–5.80)
RDW: 11.8 % (ref 11.0–15.0)
Total Lymphocyte: 18.8 %
WBC: 7.1 10*3/uL (ref 3.8–10.8)

## 2022-02-14 LAB — COMPLETE METABOLIC PANEL WITH GFR
AG Ratio: 1.8 (calc) (ref 1.0–2.5)
ALT: 11 U/L (ref 9–46)
AST: 15 U/L (ref 10–35)
Albumin: 4.4 g/dL (ref 3.6–5.1)
Alkaline phosphatase (APISO): 76 U/L (ref 35–144)
BUN: 21 mg/dL (ref 7–25)
CO2: 34 mmol/L — ABNORMAL HIGH (ref 20–32)
Calcium: 9.8 mg/dL (ref 8.6–10.3)
Chloride: 98 mmol/L (ref 98–110)
Creat: 1.1 mg/dL (ref 0.70–1.28)
Globulin: 2.5 g/dL (calc) (ref 1.9–3.7)
Glucose, Bld: 94 mg/dL (ref 65–99)
Potassium: 5.1 mmol/L (ref 3.5–5.3)
Sodium: 139 mmol/L (ref 135–146)
Total Bilirubin: 0.5 mg/dL (ref 0.2–1.2)
Total Protein: 6.9 g/dL (ref 6.1–8.1)
eGFR: 72 mL/min/{1.73_m2} (ref >=60)

## 2022-02-14 LAB — LIPID PANEL
Cholesterol: 139 mg/dL (ref <200)
HDL: 60 mg/dL (ref >=40)
LDL Cholesterol (Calc): 60 mg/dL (calc)
Non-HDL Cholesterol (Calc): 79 mg/dL (calc) (ref <130)
Total CHOL/HDL Ratio: 2.3 (calc) (ref <5.0)
Triglycerides: 102 mg/dL (ref <150)

## 2022-02-14 LAB — MAGNESIUM: Magnesium: 2.1 mg/dL (ref 1.5–2.5)

## 2022-02-14 LAB — TSH: TSH: 1.58 mIU/L (ref 0.40–4.50)

## 2022-02-14 NOTE — Progress Notes (Unsigned)
Future Appointments  Date Time Provider Department  06/02/2022 11:00 AM [name], NP [facility]  10/30/2022  2:00 PM [name], MD [facility]    History of Present Illness:      Patient is  a very nioce 71 yo MWM with labile HTN & ES O2 dependwho presents for recheck. Patient has concerns re: 2 crusty non-healing skin lesions of his anterior Rt shin & dorsal mid Lt forearm.   Medications    rosuvastatin 5 MG tablet, Take 1 tab four days a week (WMFSat) in the evening for cholesterol.    albuterol  HFA inhaler, Inhale 1 to 2 puffs 4 x day or every 4 hours to rescue asthma   budesonide  0.5 MG/2ML nebulizer solution, Take 2 mLs (0.5 mg total) by nebulization 2 (two) times daily.   diphenhydrAMINE 25 MG tablet, Take 50 mg by mouth daily.    FLONASE  nasal spray, Place 2 sprays into both nostrils daily   formoterol  20 MCG/2ML neb soln, USE 1 VIAL VIA NEBULIER TWO TIMES A DAY   revefenacin (YUPELRI) 175 MCG/3ML neb soln, Take 3 mLs (175 mcg total) by neb daily.   aspirin 81 MG tablet, Take every evening.    VITAMIN D 5000 units , Take 10,000 Units daily.    traZODone 150 MG tablet, TAKE 1 TABLET 1 HOUR BEFORE BEDTIME AS NEEDED    ASPERCREME 10 % cream, Apply 1 application topically as needed for muscle pain.   zinc 50 MG tablet, Take  daily.  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   Observations/Objective:  BP 108/68   Pulse 88   Temp 97.9 F (36.6 C)   Resp  18   Ht '5\' 5"'$  (1.651 m)   Wt 140 lb (63.5 kg)   SpO2 92% Comment: On 3 Liters O2  BMI 23.30 kg/m   HEENT - WNL. Neck - supple.  Chest - Clear equal BS. Cor - Nl HS. RRR w/o sig MGR. PP 1(+). No edema. MS- FROM w/o deformities.  Gait Nl. Neuro -  Nl w/o focal abnormalities. Skin -  (1) there is a 10 mm x 15 mm crusting indurated skin lesion of the proximal anterior Rt shin.              (2) there is a 16 mm x 22 mm crusting indurated skin lesion of the distal dorsal mid Lt forearm   (1)  Procedure  (CPT                )       After informed consent & aseptic prep with Alcohol, the lesion of the Rt ant shin was infiltrated with 2 ml of Marcaine 0.5% . Then with a #10 scalpel ,the lesion was excised in toto with a vertical elliptical full tissue skin block to the deep sub-cut tissues & the specimen was sent for path analysis.  With  #9 vertical mattress sutures of Nylon 3-0 , the wound edges were aligned, everted & secured to close the wound. Triple Abx Ung was applied covered with  a sterile guaze and then covered with a 6" x 8" Tegaderm    (2)  Procedure   ( CPT               )       The lesion of the  Rt dorsal mid forearm was similiarly prep'd  with Alcohol, then  infiltrated with 2 ml of Marcaine 0.5% . Then with a #10 scalpel ,the lesion was excised in toto with a vertical elliptical full tissue skin block to the deep sub-cut tissues & the specimen was sent for path analysis.  With  #11 vertical mattress sutures of Nylon 3-0 , the wound edges were aligned, everted & secured to close the wound. Triple Abx Ung was applied covered with a sterile guaze and then covered with a 6" x 8" Tegaderm .   Assessment and Plan:      Follow Up Instructions:        I discussed the assessment and treatment plan with the patient. The patient was provided an opportunity to ask questions and all were answered. The patient agreed with the plan and demonstrated an understanding of the instructions.        The patient was advised to call back or seek an in-person evaluation if the symptoms worsen or if the condition fails to improve as anticipated.   Kirtland Bouchard, MD

## 2022-02-19 NOTE — Progress Notes (Signed)
<><><><><><><><><><><><><><><><><><><><><><><><><><><><><><><><><> <><><><><><><><><><><><><><><><><><><><><><><><><><><><><><><><><>    Both were Squamous cell skin cancers    (1) Right shin - Completely excised    (2) Left Forearm - had a (+) positive deep margin involved,                                                                              so will require a deeper excision  <><><><><><><><><><><><><><><><><><><><><><><><><><><><><><><><><> <><><><><><><><><><><><><><><><><><><><><><><><><><><><><><><><><>

## 2022-02-21 ENCOUNTER — Ambulatory Visit: Payer: Medicare Other | Admitting: Internal Medicine

## 2022-02-21 ENCOUNTER — Encounter: Payer: Self-pay | Admitting: Internal Medicine

## 2022-02-21 VITALS — BP 112/69 | HR 99 | Temp 96.9°F | Wt 140.2 lb

## 2022-02-21 DIAGNOSIS — C44722 Squamous cell carcinoma of skin of right lower limb, including hip: Secondary | ICD-10-CM

## 2022-02-21 DIAGNOSIS — C44629 Squamous cell carcinoma of skin of left upper limb, including shoulder: Secondary | ICD-10-CM

## 2022-02-27 NOTE — Progress Notes (Signed)
Future Appointments  Date Time Provider Department  02/28/2022 11:45 AM [name], MD [facility]  06/02/2022 11:00 AM [name], NP [facility]  10/30/2022  2:00 PM [name], MD [facility]    History of Present Illness:  Patient is a very nice 71 yo MWM with recent excision of a SCC of the Lt dorsal forearm with path showing (+) deep margin & he returns for deeper /wider re-excision of specimen (Surgical pathology (Order #[id number]) on 02/14/22) .   Medications   Current Outpatient Medications (Cardiovascular):    rosuvastatin (CRESTOR) 5 MG tablet, Take 1 tab four days a week (WMFSat) in the evening for cholesterol.  Current Outpatient Medications (Respiratory):    albuterol (VENTOLIN HFA) 108 (90 Base) MCG/ACT inhaler, Inhale 1 to 2 puffs 4 x day or every 4 hours to rescue asthma   budesonide (PULMICORT) 0.5 MG/2ML nebulizer solution, Take 2 mLs (0.5 mg total) by nebulization 2 (two) times daily.   diphenhydrAMINE (BENADRYL) 25 MG tablet, Take 50 mg by mouth daily.    fluticasone (FLONASE) 50 MCG/ACT nasal spray, Place 2 sprays into both nostrils daily. (Patient taking differently: Place 2 sprays into both nostrils every evening.)   formoterol (PERFOROMIST) 20 MCG/2ML nebulizer solution, USE 1 VIAL VIA NEBULIER TWO TIMES A DAY   revefenacin (YUPELRI) 175 MCG/3ML nebulizer solution, Take 3 mLs (175 mcg total) by nebulization daily.  Current Outpatient Medications (Analgesics):    aspirin 81 MG tablet, Take 81 mg by mouth every evening.    Current Outpatient Medications (Other):    Cholecalciferol (VITAMIN D3) 5000 units CAPS, Take 10,000 Units by mouth daily.    traZODone (DESYREL) 150 MG tablet, TAKE 1 TABLET 1 HOUR BEFORE BEDTIME AS NEEDED FOR SLEEP   trolamine salicylate (ASPERCREME) 10 % cream, Apply 1 application topically as needed for muscle pain.   zinc gluconate 50 MG tablet, Take 50 mg by mouth daily.  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   Observations/Objective:  BP 108/60   Pulse 97   Temp 97.9 F (36.6 C)   Resp 16   Ht '5\' 4"'$  (1.626 m)   Wt 138 lb 9.6 oz (62.9 kg)   SpO2 94%   BMI 23.79 kg/m   On nasal Oxygen   HEENT - WNL. Neck - supple.  Chest - Clear equal BS. Cor - Nl HS. RRR w/o sig MGR. PP 1(+). No edema. MS- FROM w/o deformities.  Gait Nl. Neuro -  Nl w/o focal abnormalities. Skin - Wound Rt shin well healed & sutures removed & neosporin / sterile Telfa pad applied & secured with paper tape.   Left shin wound appears well healed with a 3 cm scar.   Procedure   ( CPT-  24235)      After informed consent & aseptic prep with alcohol, the 3 cm scar was anesthetized with 2 cm Marcaine 0.5% . Then with a #10 scalpel  , the scar was deeply excised and wound edges were aligned , everted and secured with  #8 vertical mattress sutures with 3-0 Nylon. Antibiotic Ung, strerile  bandage applied  Assessment and Plan:      Follow Up Instructions:        I discussed the assessment and treatment plan with the patient. The patient was provided an opportunity to ask questions and all were answered. The patient agreed with the plan and demonstrated an understanding of the instructions.       The patient was advised to call back or seek an in-person evaluation if the symptoms worsen or if the condition fails to improve as anticipated.    Kirtland Bouchard, MD

## 2022-02-28 ENCOUNTER — Other Ambulatory Visit: Payer: Self-pay | Admitting: Internal Medicine

## 2022-02-28 ENCOUNTER — Ambulatory Visit (INDEPENDENT_AMBULATORY_CARE_PROVIDER_SITE_OTHER): Payer: Medicare Other | Admitting: Internal Medicine

## 2022-02-28 ENCOUNTER — Encounter: Payer: Self-pay | Admitting: Internal Medicine

## 2022-02-28 VITALS — BP 108/60 | HR 97 | Temp 97.9°F | Resp 16 | Ht 64.0 in | Wt 138.6 lb

## 2022-02-28 DIAGNOSIS — C44629 Squamous cell carcinoma of skin of left upper limb, including shoulder: Secondary | ICD-10-CM

## 2022-02-28 DIAGNOSIS — R0989 Other specified symptoms and signs involving the circulatory and respiratory systems: Secondary | ICD-10-CM | POA: Diagnosis not present

## 2022-02-28 DIAGNOSIS — I2510 Atherosclerotic heart disease of native coronary artery without angina pectoris: Secondary | ICD-10-CM

## 2022-03-06 NOTE — Progress Notes (Signed)
<><><><><><><><><><><><><><><><><><><><><><><><><><><><><><><><><>  -    Path shows residual skin cancer & appears all excised now  & OK   <><><><><><><><><><><><><><><><><><><><><><><><><><><><><><><><><>

## 2022-03-14 ENCOUNTER — Encounter: Payer: Self-pay | Admitting: Internal Medicine

## 2022-03-14 ENCOUNTER — Ambulatory Visit: Payer: Medicare Other | Admitting: Internal Medicine

## 2022-03-14 VITALS — BP 98/60 | HR 81 | Temp 98.0°F | Resp 18 | Ht 64.0 in | Wt 136.2 lb

## 2022-03-14 DIAGNOSIS — C44629 Squamous cell carcinoma of skin of left upper limb, including shoulder: Secondary | ICD-10-CM

## 2022-03-16 NOTE — Progress Notes (Signed)
   Patient returns for suture removal s/p re-excision of a SCC of the Lt Forearm.    Path report confirms SCC bx excised.   Wound appears clean w/o signs of infection &   remaining 5 sutures removed w/o difficulty.  Occlusive dsg re-applied.

## 2022-03-25 ENCOUNTER — Encounter: Payer: Self-pay | Admitting: Pulmonary Disease

## 2022-03-25 DIAGNOSIS — J449 Chronic obstructive pulmonary disease, unspecified: Secondary | ICD-10-CM

## 2022-03-25 DIAGNOSIS — J9611 Chronic respiratory failure with hypoxia: Secondary | ICD-10-CM

## 2022-03-25 MED ORDER — FORMOTEROL FUMARATE 20 MCG/2ML IN NEBU: INHALATION_SOLUTION | RESPIRATORY_TRACT | 2 refills | Status: DC

## 2022-04-23 ENCOUNTER — Ambulatory Visit (INDEPENDENT_AMBULATORY_CARE_PROVIDER_SITE_OTHER): Payer: Medicare Other

## 2022-04-23 DIAGNOSIS — J432 Centrilobular emphysema: Secondary | ICD-10-CM | POA: Diagnosis not present

## 2022-04-23 DIAGNOSIS — R911 Solitary pulmonary nodule: Secondary | ICD-10-CM

## 2022-04-23 DIAGNOSIS — R918 Other nonspecific abnormal finding of lung field: Secondary | ICD-10-CM | POA: Diagnosis not present

## 2022-04-23 DIAGNOSIS — J479 Bronchiectasis, uncomplicated: Secondary | ICD-10-CM | POA: Diagnosis not present

## 2022-04-23 DIAGNOSIS — F17210 Nicotine dependence, cigarettes, uncomplicated: Secondary | ICD-10-CM

## 2022-04-29 ENCOUNTER — Telehealth: Payer: Self-pay | Admitting: *Deleted

## 2022-04-29 ENCOUNTER — Ambulatory Visit: Payer: Medicare Other | Admitting: Adult Health

## 2022-04-29 DIAGNOSIS — F17210 Nicotine dependence, cigarettes, uncomplicated: Secondary | ICD-10-CM

## 2022-04-29 DIAGNOSIS — Z122 Encounter for screening for malignant neoplasm of respiratory organs: Secondary | ICD-10-CM

## 2022-04-29 DIAGNOSIS — Z87891 Personal history of nicotine dependence: Secondary | ICD-10-CM

## 2022-04-29 NOTE — Telephone Encounter (Signed)
Spoke with pt and scheduled ov with Eric Form, NP 05/06/22 11:00. Ct results faxed to PCP. Order placed for 12 mth follow up CT.

## 2022-04-29 NOTE — Telephone Encounter (Signed)
Staff message from Eric Form, NP 04/25/22 :   Magdalen Spatz, NP  Christie Beckers, RN; Joella Prince, RN This is fine as a 12 month follow up scan . See if we can get him on my schedule ( Non-Urgent) so we can lake sure he is using a flutter valve for mucus clearance. He has bronchiectasis. Thanks so much   I left message for pt to call back to discuss lung screening CT results and schedule follow up appt.

## 2022-04-30 ENCOUNTER — Other Ambulatory Visit: Payer: Self-pay

## 2022-04-30 ENCOUNTER — Ambulatory Visit: Payer: Medicare Other | Admitting: Nurse Practitioner

## 2022-04-30 ENCOUNTER — Other Ambulatory Visit: Payer: Self-pay | Admitting: Nurse Practitioner

## 2022-04-30 DIAGNOSIS — G4700 Insomnia, unspecified: Secondary | ICD-10-CM

## 2022-04-30 MED ORDER — ROSUVASTATIN CALCIUM 5 MG PO TABS
ORAL_TABLET | ORAL | 3 refills | Status: DC
Start: 2022-04-30 — End: 2023-04-05

## 2022-05-06 ENCOUNTER — Ambulatory Visit (INDEPENDENT_AMBULATORY_CARE_PROVIDER_SITE_OTHER): Payer: Medicare Other | Admitting: Acute Care

## 2022-05-06 ENCOUNTER — Encounter: Payer: Self-pay | Admitting: Acute Care

## 2022-05-06 VITALS — BP 108/58 | HR 84 | Temp 98.0°F | Ht 64.0 in | Wt 136.4 lb

## 2022-05-06 DIAGNOSIS — J449 Chronic obstructive pulmonary disease, unspecified: Secondary | ICD-10-CM

## 2022-05-06 DIAGNOSIS — I2510 Atherosclerotic heart disease of native coronary artery without angina pectoris: Secondary | ICD-10-CM

## 2022-05-06 DIAGNOSIS — J479 Bronchiectasis, uncomplicated: Secondary | ICD-10-CM

## 2022-05-06 NOTE — Progress Notes (Signed)
History of Present Illness Douglas Barrera is a 71 y.o. male former smoker ( Quit 08/2018 with a 98 pack year smoking history)  and COPD. He is followed by the lung cancer screening program and by Dr. Valeta Harms.   Maintenance  Yupelri, Performist and Pulmicort nebs Rescue  Albuterol inhaler   05/06/2022 Pt. Presents for follow up for for abnormal Low Dose Ct Chest. LDCT 12/2021 was read as a 4A. There was notation of a 6.7 mm nodule, that has resolved on 3 month  follow up 04/23/2022. There was however notation of bronchiectasis on follow up. He states he does cough, and he does have significant sputum burden from the cough. He does use mucinex daily, and he feels this does help with secretion burden. We will add Flutter valve maintenance  and we will send sputum for culture , AFB and Yeast. Weight is stable from his last visit in May. He is wearing his oxygen at 3 L [place]. He states he has been stable from a pulmonary perspective.Using his nebs and no recent COPD  flare. Rare need for rescue use.Of note he has a very congested cough at present. He state his secretions are clear. Denies fever , chest pain or orthopnea.   Test Results: LDCT Chest 04/23/2022 Lung-RADS 2, benign appearance or behavior. Continue annual screening with low-dose chest CT without contrast in 12 months. 2. 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 3. Three-vessel coronary atherosclerosis. 4. Aortic Atherosclerosis (ICD10-I70.0) and Emphysema (ICD10-J43.9).  LDCT Chest 01/09/2022 New solid pulmonary nodule of the right upper lobe measuring 6.7 mm.Lung-RADS 4A, suspicious. Follow up low-dose chest CT without contrast in 3 months (please use the following order, CT CHEST LCS NODULE FOLLOW-UP W/O CM) is recommended. Alternatively, PET may be considered  when there is a solid component 66m or larger. 2. Left main and three-vessel coronary artery calcifications. 3. Aortic Atherosclerosis (ICD10-I70.0) and Emphysema (ICD10-J43.9).   Body mass index is 23.41 kg/m.     Latest Ref Rng & Units 02/13/2022    9:15 AM 10/24/2021    3:18 PM 04/22/2021   12:12 PM  CBC  WBC 3.8 - 10.8 Thousand/uL 7.1  7.9  7.5   Hemoglobin 13.2 - 17.1 g/dL 15.2  15.6  15.2   Hematocrit 38.5 - 50.0 % 45.6  46.4  44.6   Platelets 140 - 400 Thousand/uL 202  197  203        Latest Ref Rng & Units 02/13/2022    9:15 AM 10/24/2021    3:18 PM 04/22/2021   12:12 PM  BMP  Glucose 65 - 99 mg/dL 94  83  94   BUN 7 - 25 mg/dL '21  21  21   '$ Creatinine 0.70 - 1.28 mg/dL 1.10  0.89  1.00   BUN/Creat Ratio 6 - 22 (calc) NOT APPLICABLE  NOT APPLICABLE  NOT APPLICABLE   Sodium 1237- 146 mmol/L 139  138  139   Potassium 3.5 - 5.3 mmol/L 5.1  4.9  4.3   Chloride 98 - 110 mmol/L 98  96  99   CO2 20 - 32 mmol/L 34  35  35   Calcium 8.6 - 10.3 mg/dL 9.8  10.1  9.6     BNP No results found for: "BNP"  ProBNP No results found for: "PROBNP"  PFT    Component Value Date/Time  FEV1PRE 0.84 05/31/2018 0950   FEV1POST 0.65 05/31/2018 0950   FVCPRE 1.83 05/31/2018 0950   FVCPOST 1.89 05/31/2018 0950   TLC 5.85 03/24/2018 1236   DLCOUNC 8.83 05/31/2018 0950   PREFEV1FVCRT 46 05/31/2018 0950   PSTFEV1FVCRT 34 05/31/2018 0950    CT CHEST LCS NODULE F/U LOW DOSE WO CONTRAST  Result Date: 04/24/2022 CLINICAL DATA:  71 year old asymptomatic male current smoker presents for three-month follow-up. EXAM: CT CHEST WITHOUT CONTRAST FOR LUNG CANCER SCREENING NODULE FOLLOW-UP TECHNIQUE: Multidetector CT imaging of the chest was performed following the standard protocol without IV contrast. RADIATION DOSE REDUCTION: This exam was performed according to the departmental dose-optimization program which includes automated exposure control, adjustment of the mA and/or kV according to patient  size and/or use of iterative reconstruction technique. COMPARISON:  01/09/2022 screening chest CT. FINDINGS: Cardiovascular: Normal heart size. No significant pericardial effusion/thickening. Three-vessel coronary atherosclerosis. Atherosclerotic nonaneurysmal thoracic aorta. Normal caliber pulmonary arteries. Mediastinum/Nodes: No discrete thyroid nodules. Unremarkable esophagus. No pathologically enlarged axillary, mediastinal or hilar lymph nodes, noting limited sensitivity for the detection of hilar adenopathy on this noncontrast study. Lungs/Pleura: No pneumothorax. No pleural effusion. Severe centrilobular emphysema with diffuse bronchial wall thickening. No significant growth of previously visualized pulmonary nodules. Previously described new posterior right upper lobe subpleural nodular opacity is unchanged (series 3/image 30). No new significant pulmonary nodules. Mild patchy tree-in-bud opacities in both lungs with associated mild cylindrical bronchiectasis, mildly worsened in posterior left upper lobe and anterior/medial left lower lobe and right middle lobe. Upper abdomen: No acute abnormality. Musculoskeletal: No aggressive appearing focal osseous lesions. Mild thoracic spondylosis. IMPRESSION: 1. Lung-RADS 2, benign appearance or behavior. Continue annual screening with low-dose chest CT without contrast in 12 months. 2. 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 3. Three-vessel coronary atherosclerosis. 4. Aortic Atherosclerosis (ICD10-I70.0) and Emphysema (ICD10-J43.9). Electronically Signed   By: Ilona Sorrel M.D.   On: 04/24/2022 16:52    Past medical hx Past Medical History:  Diagnosis Date   COPD (chronic obstructive pulmonary disease) (HCC)    GERD (gastroesophageal reflux disease)    History of elevated  lipids    Hyperlipidemia    IBS (irritable bowel syndrome)    Labile hypertension    patient is not on meds for hypertension   Prediabetes    Secondary polycythemia 05/25/2018   Tubular adenoma of colon    Vitamin D deficiency      Social History   Tobacco Use   Smoking status: Former    Packs/day: 2.00    Years: 49.00    Total pack years: 98.00    Types: Cigars, Cigarettes    Start date: 10/17/1968    Quit date: 08/18/2018    Years since quitting: 3.7   Smokeless tobacco: Never   Tobacco comments:    quit smoking cigarettes in Dec 2019 but do backslide occasional, but smokes 3-4 cigars a week. small sigar on occasion 10/13/18  Substance Use Topics   Alcohol use: No    Alcohol/week: 0.0 standard drinks of alcohol   Drug use: No    Mr.Joiner reports that he quit smoking about 3 years ago. His smoking use included cigars and cigarettes. He started smoking about 53 years ago. He has a 98.00 pack-year smoking history. He has never used smokeless tobacco. He reports that he does not drink alcohol and does not use drugs.  Tobacco Cessation: Former smoker , quit 2020 with a 98 pack year smoking history. He does smoke cigars on occasion  Past surgical hx, Family hx, Social hx all reviewed.  Current Outpatient Medications on File Prior to Visit  Medication Sig   albuterol (VENTOLIN HFA) 108 (90 Base) MCG/ACT inhaler Inhale 1 to 2 puffs 4 x day or every 4 hours to rescue asthma   aspirin 81 MG tablet Take 81 mg by mouth every evening.    budesonide (PULMICORT) 0.5 MG/2ML nebulizer solution Take 2 mLs (0.5 mg total) by nebulization 2 (two) times daily.   Cholecalciferol (VITAMIN D3) 5000 units CAPS Take 10,000 Units by mouth daily.    diphenhydrAMINE (BENADRYL) 25 MG tablet Take 50 mg by mouth daily.    fluticasone (FLONASE) 50 MCG/ACT nasal spray Place 2 sprays into both nostrils daily. (Patient taking differently: Place 2 sprays into both nostrils every evening.)   formoterol  (PERFOROMIST) 20 MCG/2ML nebulizer solution USE 1 VIAL VIA NEBULIER TWO TIMES A DAY   revefenacin (YUPELRI) 175 MCG/3ML nebulizer solution Take 3 mLs (175 mcg total) by nebulization daily.   rosuvastatin (CRESTOR) 5 MG tablet Take 1 tab four days a week (WMFSat) in the evening for cholesterol.   traZODone (DESYREL) 150 MG tablet TAKE 1 TABLET BY MOUTH 1 HOUR BEFORE BEDTIME AS NEEDED FOR SLEEP   trolamine salicylate (ASPERCREME) 10 % cream Apply 1 application topically as needed for muscle pain.   zinc gluconate 50 MG tablet Take 50 mg by mouth daily.   No current facility-administered medications on file prior to visit.     Allergies  Allergen Reactions   Niacin And Related Other (See Comments)    Unknown Childhood reaction.    Review Of Systems:  Constitutional:   No  weight loss, night sweats,  Fevers, chills, + fatigue, or  lassitude.  HEENT:   No headaches,  Difficulty swallowing,  Tooth/dental problems, or  Sore throat,                No sneezing, itching, ear ache, nasal congestion, post nasal drip,   CV:  No chest pain,  Orthopnea, PND, swelling in lower extremities, anasarca, dizziness, palpitations, syncope.   GI  No heartburn, indigestion, abdominal pain, nausea, vomiting, diarrhea, change in bowel habits, loss of appetite, bloody stools.   Resp: + baseline  shortness of breath with exertion or at rest.  + baseline  excess mucus, + baseline  productive cough,  No non-productive cough,  No coughing up of blood.  No change in color of mucus.  Rare  wheezing.  No chest wall deformity  Skin: no rash or lesions.  GU: no dysuria, change in color of urine, no urgency or frequency.  No flank pain, no hematuria   MS:  No joint pain or swelling.  No decreased range of motion.  No back pain.  Psych:  No change in mood or affect. No depression or anxiety.  No memory loss.   Vital Signs BP (!) 108/58 (BP Location: Right Arm, Patient Position: Sitting, Cuff Size: Normal)   Pulse  84   Temp 98 F (36.7 C) (Oral)   Ht '5\' 4"'$  (1.626 m)   Wt 136 lb 6.4 oz (61.9 kg)   SpO2 91% Comment: 3L pulse  BMI 23.41 kg/m    Physical Exam:  General- No distress,  A&Ox3, pleasant , wearing oxygen at 3 L [place] ENT: No sinus tenderness, TM clear, pale nasal mucosa, no oral exudate,no post nasal  drip, no LAN Cardiac: S1, S2, regular rate and rhythm, no murmur Chest: No wheeze/ rales/ dullness; no accessory muscle use, no nasal flaring, no sternal retractions Abd.: Soft Non-tender, ND, BS +, Body mass index is 23.41 kg/m. Ext: No clubbing cyanosis, edema Neuro:  Frail, muscle wasting, MAE x 4, A&O x 3 Skin: No rashes, warm and dry, no lesions  Psych: normal mood and behavior   Assessment/Plan Bronchiectasis/ COPD Plan Continue Mucinex 1 tablet twice daily as you have been doing. Take with a full glass of water. We will add on flutter valve daily . We will instruct you on use.  Use this 4-5 times daily as maintenance to help clear secretions  We will send sputum for culture, AFB and Fungus.  Early am sputum is best.  We will provide you with specimen cups and a bag to place them in.  Bring to the office within 4 hours of collection.  We will call you with results. Continue Yupelri, Performist and Pulmicort nebs as you have been doing.  Albuterol as rescue as needed.  Continue oxygen at 3 L as you have been doing Goal is to keep sats > 88% at all times.  Follow up CT Chest 04/2023 through the screening program. Follow up with Dr.Icard in November 2023>> needs scheduling. Call if you need [place] sooner.  Please contact office for sooner follow up if symptoms do not improve or worsen or seek emergency care    I spent 35 minutes dedicated to the care of this patient on the date of this encounter to include pre-visit review of records, face-to-face time with the patient discussing conditions above, post visit ordering of testing, clinical documentation with the electronic health  record, making appropriate referrals as documented, and communicating necessary information to the patient's healthcare team.    Magdalen Spatz, NP 05/06/2022  11:12 AM

## 2022-05-06 NOTE — Patient Instructions (Addendum)
It is good to see you today.  Continue Mucinex 1 tablet twice daily as you have been doing. Take with a full glass of water. We will add on flutter valve daily . We will instruct you on use.  Use this 4-5 times daily as maintenance to help clear secretions  We will send sputum for culture, AFB and Fungus.  Early am sputum is best.  We will provide you with specimen cups and a bag to place them in.  Bring to the office within 4 hours of collection.  We will call you with results. Continue Yupelri, Performist and Pulmicort nebs as you have been doing.  Albuterol as rescue as needed.  Continue oxygen at 3 L as you have been doing Goal is to keep sats > 88% at all times.  Follow up CT Chest 04/2023. Follow up with Dr.Icard in November 2023>> needs scheduling. Call if you need [place] sooner.  Please contact office for sooner follow up if symptoms do not improve or worsen or seek emergency care    .

## 2022-05-22 ENCOUNTER — Ambulatory Visit (INDEPENDENT_AMBULATORY_CARE_PROVIDER_SITE_OTHER): Payer: Medicare Other | Admitting: Nurse Practitioner

## 2022-05-22 ENCOUNTER — Other Ambulatory Visit: Payer: Self-pay

## 2022-05-22 VITALS — HR 81 | Temp 97.5°F

## 2022-05-22 DIAGNOSIS — Z1152 Encounter for screening for COVID-19: Secondary | ICD-10-CM

## 2022-05-22 LAB — POC COVID19 BINAXNOW: SARS Coronavirus 2 Ag: NEGATIVE

## 2022-05-22 MED ORDER — AZITHROMYCIN 250 MG PO TABS: ORAL_TABLET | ORAL | 1 refills | Status: DC

## 2022-05-22 NOTE — Progress Notes (Signed)
THIS ENCOUNTER IS A VIRTUAL VISIT DUE TO COVID-19 - PATIENT WAS NOT SEEN IN THE OFFICE.  PATIENT HAS CONSENTED TO VIRTUAL VISIT / TELEMEDICINE VISIT   Virtual Visit via telephone Note  I connected with  Douglas Barrera on 05/22/2022 by telephone.  I verified that I am speaking with the correct person using two identifiers.    I discussed the limitations of evaluation and management by telemedicine and the availability of in person appointments. The patient expressed understanding and agreed to proceed.  History of Present Illness:  Pulse 81   Temp (!) 97.5 F (36.4 C)   SpO2 (!) 89%  71 y.o. patient contacted office reporting covid exposure to positive wife. he did not test positive but his wife did on 05/22/22 with symptoms over the last 2 days. OV was conducted by telephone to minimize exposure. This patient was vaccinated for covid 19, last 05/2020  He is currently on supplemental oxygen due to COPD with resting Sp)2 of 89%.   Medications   Current Outpatient Medications (Cardiovascular):    rosuvastatin (CRESTOR) 5 MG tablet, Take 1 tab four days a week (WMFSat) in the evening for cholesterol.  Current Outpatient Medications (Respiratory):    albuterol (VENTOLIN HFA) 108 (90 Base) MCG/ACT inhaler, Inhale 1 to 2 puffs 4 x day or every 4 hours to rescue asthma   budesonide (PULMICORT) 0.5 MG/2ML nebulizer solution, Take 2 mLs (0.5 mg total) by nebulization 2 (two) times daily.   diphenhydrAMINE (BENADRYL) 25 MG tablet, Take 50 mg by mouth daily.    fluticasone (FLONASE) 50 MCG/ACT nasal spray, Place 2 sprays into both nostrils daily. (Patient taking differently: Place 2 sprays into both nostrils every evening.)   formoterol (PERFOROMIST) 20 MCG/2ML nebulizer solution, USE 1 VIAL VIA NEBULIER TWO TIMES A DAY   revefenacin (YUPELRI) 175 MCG/3ML nebulizer solution, Take 3 mLs (175 mcg total) by nebulization daily.  Current Outpatient Medications (Analgesics):    aspirin 81 MG tablet,  Take 81 mg by mouth every evening.    Current Outpatient Medications (Other):    Cholecalciferol (VITAMIN D3) 5000 units CAPS, Take 10,000 Units by mouth daily.    traZODone (DESYREL) 150 MG tablet, TAKE 1 TABLET BY MOUTH 1 HOUR BEFORE BEDTIME AS NEEDED FOR SLEEP   trolamine salicylate (ASPERCREME) 10 % cream, Apply 1 application topically as needed for muscle pain.   zinc gluconate 50 MG tablet, Take 50 mg by mouth daily.  Allergies:  Allergies  Allergen Reactions   Niacin And Related Other (See Comments)    Unknown Childhood reaction.    Problem list He has 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[place]); Labile hypertension; Hyperlipidemia; GERD (gastroesophageal reflux disease); Other abnormal glucose (prediabetes); IBS (irritable bowel syndrome); Vitamin D deficiency; Medication management; Smoker; Aortic atherosclerosis ([place]) by Chest CTscan on 01/09/2021; Coronary atherosclerosis by Chest CT scan on 11/01/2019; Chronic hypoxemic respiratory failure ([place]); History of adenomatous polyp of colon; Right upper lobe pulmonary nodule; Skin lesion of right leg; and Skin lesion of left arm on their problem list.   Social History:   reports that he quit smoking about 3 years ago. His smoking use included cigars and cigarettes. He started smoking about 53 years ago. He has a 98.00 pack-year smoking history. He has never used smokeless tobacco. He reports that he does not drink alcohol and does not use drugs.  Observations/Objective:  General : Well sounding patient in no apparent distress HEENT: no hoarseness, no cough for duration of visit Lungs: speaks in complete sentences,  no audible wheezing, no apparent distress Neurological: alert, oriented x 3 Psychiatric: pleasant, judgement appropriate   Assessment and Plan:  Covid 19 Covid 19 negative per rapid screening test in car Risk factors include: COPD, supplemental oxygen Symptoms are: None Due to co morbid conditions and risk factors, discussed antivirals  Paxlovid if symptoms worsen and Azithromycin for prophylactic tmt Immue support reviewed  Reach out to office if symptoms worsen Take tylenol PRN temp 101+ Push hydration Regular ambulation or calf exercises exercises for clot prevention and 81 mg ASA unless contraindicated Sx supportive therapy suggested Follow up via mychart or telephone if needed Advised patient obtain O2 monitor; present to ED if persistently <90% or with severe dyspnea, CP, fever uncontrolled by tylenol, confusion, sudden decline Should remain in isolation until at least 5 days from onset of sx, 24-48 hours fever free without tylenol, sx such as cough are improved.      Follow Up Instructions:  I discussed the assessment and treatment plan with the patient. The patient was provided an opportunity to ask questions and all were answered. The patient agreed with the plan and demonstrated an understanding of the instructions.   The patient was advised to call back or seek an in-person evaluation if the symptoms worsen or if the condition fails to improve as anticipated.  I provided 15 minutes of non-face-to-face time during this encounter.   [name], NP

## 2022-05-29 ENCOUNTER — Ambulatory Visit: Payer: Medicare Other | Admitting: Nurse Practitioner

## 2022-05-29 ENCOUNTER — Encounter: Payer: Self-pay | Admitting: Nurse Practitioner

## 2022-05-29 VITALS — BP 107/60 | HR 102 | Temp 98.4°F

## 2022-05-29 DIAGNOSIS — I2510 Atherosclerotic heart disease of native coronary artery without angina pectoris: Secondary | ICD-10-CM | POA: Diagnosis not present

## 2022-05-29 DIAGNOSIS — U071 COVID-19: Secondary | ICD-10-CM

## 2022-05-29 MED ORDER — PROMETHAZINE-DM 6.25-15 MG/5ML PO SYRP: 5.0000 mL | ORAL_SOLUTION | Freq: Four times a day (QID) | ORAL | 1 refills | Status: DC | PRN

## 2022-05-29 MED ORDER — MOLNUPIRAVIR EUA 200MG CAPSULE: 4.0000 | ORAL_CAPSULE | Freq: Two times a day (BID) | ORAL | 0 refills | Status: AC

## 2022-05-29 MED ORDER — DEXAMETHASONE 1 MG PO TABS: ORAL_TABLET | ORAL | 0 refills | Status: DC

## 2022-05-29 NOTE — Progress Notes (Signed)
THIS ENCOUNTER IS A VIRTUAL VISIT DUE TO COVID-19 - PATIENT WAS NOT SEEN IN THE OFFICE.  PATIENT HAS CONSENTED TO VIRTUAL VISIT / TELEMEDICINE VISIT   Virtual Visit via telephone Note  I connected with  Douglas Barrera on 05/29/2022 by telephone.  I verified that I am speaking with the correct person using two identifiers.    I discussed the limitations of evaluation and management by telemedicine and the availability of in person appointments. The patient expressed understanding and agreed to proceed.  History of Present Illness:  There were no vitals taken for this visit. 71 y.o. patient contacted office reporting URI sx . he tested positive by home test. OV was conducted by telephone to minimize exposure. This patient was vaccinated for covid 19, last 06/19/20 Immunization History  Administered Date(s) Administered   Fluad Quad(high Dose 65+) 06/13/2020   Influenza Split 06/20/2013, 06/20/2014, 06/28/2015   Influenza, High Dose Seasonal PF 06/17/2017, 05/24/2018, 06/14/2018, 06/29/2019   Influenza,inj,quad, With Preservative 07/21/2016   Influenza-Unspecified 06/17/2017   PFIZER(Purple Top)SARS-COV-2 Vaccination 09/26/2019, 10/17/2019, 06/19/2020   PPD Test 06/20/2014, 06/28/2015, 07/21/2016   Pneumococcal Conjugate-13 06/20/2014   Pneumococcal Polysaccharide-23 04/30/2009, 08/12/2017   Tdap 09/01/2009   Zoster Recombinat (Shingrix) 01/14/2022, 03/20/2022    Sx began today with upset stomach  Treatments tried so far:  Zithromax Exposures: wife   Medications   Current Outpatient Medications (Cardiovascular):    rosuvastatin (CRESTOR) 5 MG tablet, Take 1 tab four days a week (WMFSat) in the evening for cholesterol.  Current Outpatient Medications (Respiratory):    albuterol (VENTOLIN HFA) 108 (90 Base) MCG/ACT inhaler, Inhale 1 to 2 puffs 4 x day or every 4 hours to rescue asthma   budesonide (PULMICORT) 0.5 MG/2ML nebulizer solution, Take 2 mLs (0.5 mg total) by nebulization  2 (two) times daily.   diphenhydrAMINE (BENADRYL) 25 MG tablet, Take 50 mg by mouth daily.    fluticasone (FLONASE) 50 MCG/ACT nasal spray, Place 2 sprays into both nostrils daily. (Patient taking differently: Place 2 sprays into both nostrils every evening.)   formoterol (PERFOROMIST) 20 MCG/2ML nebulizer solution, USE 1 VIAL VIA NEBULIER TWO TIMES A DAY   revefenacin (YUPELRI) 175 MCG/3ML nebulizer solution, Take 3 mLs (175 mcg total) by nebulization daily.  Current Outpatient Medications (Analgesics):    aspirin 81 MG tablet, Take 81 mg by mouth every evening.    Current Outpatient Medications (Other):    azithromycin (ZITHROMAX) 250 MG tablet, Take 2 tablets (500 mg) on Day 1 followed by 1 tablet  (250 mg) daily until complete.   Cholecalciferol (VITAMIN D3) 5000 units CAPS, Take 10,000 Units by mouth daily.    traZODone (DESYREL) 150 MG tablet, TAKE 1 TABLET BY MOUTH 1 HOUR BEFORE BEDTIME AS NEEDED FOR SLEEP   trolamine salicylate (ASPERCREME) 10 % cream, Apply 1 application topically as needed for muscle pain.   zinc gluconate 50 MG tablet, Take 50 mg by mouth daily.  Allergies:  Allergies  Allergen Reactions   Niacin And Related Other (See Comments)    Unknown Childhood reaction.    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   Social History:   reports that he quit smoking about 3 years ago. His smoking use included cigars and cigarettes. He started smoking about 53 years ago.  He has a 98.00 pack-year smoking history. He has never used smokeless tobacco. He reports that he does not drink alcohol and does not use drugs.  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  Symptoms are: mild Due to co morbid conditions and risk factors, discussed antivirals  Immue support reviewed  Take tylenol PRN temp 101+ Push hydration Regular ambulation or calf exercises exercises for clot prevention and 81 mg ASA unless contraindicated Sx supportive therapy suggested Follow up via mychart or telephone if needed Advised patient obtain O2 monitor; present to ED if persistently <90% or with severe dyspnea, CP, fever uncontrolled by tylenol, confusion, sudden decline Should remain in isolation until at least 5 days from onset of sx, 24-48 hours fever free without tylenol, sx such as cough are improved.   Diagnoses and all orders for this visit:  COVID-19 -     dexamethasone (DECADRON) 1 MG tablet; Take 3 tabs for 3 days, 2 tabs for 3 days 1 tab for 5 days. Take with food. -     molnupiravir EUA  (LAGEVRIO) 200 mg CAPS capsule; Take 4 capsules (800 mg total) by mouth 2 (two) times daily for 5 days. -     promethazine-dextromethorphan (PROMETHAZINE-DM) 6.25-15 MG/5ML syrup; Take 5 mLs by mouth 4 (four) times daily as needed for cough.      Follow Up Instructions:  I discussed the assessment and treatment plan with the patient. The patient was provided an opportunity to ask questions and all were answered. The patient agreed with the plan and demonstrated an understanding of the instructions.   The patient was advised to call back or seek an in-person evaluation if the symptoms worsen or if the condition fails to improve as anticipated.  I provided 20 minutes of non-face-to-face time during this encounter.   [name], NP

## 2022-05-29 NOTE — Patient Instructions (Signed)
Take tylenol PRN temp 101+ Push hydration Regular ambulation or calf exercises exercises for clot prevention and 81 mg ASA unless contraindicated Sx supportive therapy suggested Follow up via mychart or telephone if needed Advised patient obtain O2 monitor; present to ED if persistently <90% or with severe dyspnea, CP, fever uncontrolled by tylenol, confusion, sudden decline Should remain in isolation until at least 5 days from onset of sx, 24-48 hours fever free without tylenol, sx such as cough are improved.

## 2022-06-02 ENCOUNTER — Ambulatory Visit: Payer: Medicare Other | Admitting: Nurse Practitioner

## 2022-06-06 ENCOUNTER — Encounter: Payer: Self-pay | Admitting: Internal Medicine

## 2022-06-12 ENCOUNTER — Encounter: Payer: Self-pay | Admitting: Internal Medicine

## 2022-06-17 NOTE — Progress Notes (Unsigned)
MEDICARE ANNUAL WELLNESS VISIT AND FOLLOW UP Assessment:   Douglas Barrera was seen today for follow-up and medicare wellness.  Diagnoses and all orders for this visit:  Annual Medicare Wellness Visit Due annually  Health maintenance reviewed  Labile hypertension Controlled off of medications Monitor blood pressure at home; call if consistently over 130/80 Continue DASH diet.   Reminder to go to the ER if any CP, SOB, nausea, dizziness, severe HA, changes vision/speech, left arm numbness and tingling and jaw pain.  Coronary atherosclerosis due to calcified coronary lesion Per CT, has seen Dr. Gwenlyn Found but declined further workup Continue ASA, discussed LDL goal <70 Denies angina sx Control blood pressure, cholesterol, glucose, increase exercise.   Aortic atherosclerosis ([place]) Per numerous CTs Control blood pressure, cholesterol, glucose, increase exercise.   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[place]) Followed by pulmonology; continue neb/inhalers; O2; counseled to stop smoking  Chronic hypoxemic respiratory failure ([place]) On O2; monitor   Irritable bowel syndrome, unspecified type Add soluble fiber; monitor; recently fairly managed  Gastroesophageal reflux disease, esophagitis presence not specified Well managed on current medications Discussed diet, avoiding triggers and other lifestyle changes  Vitamin D deficiency At goal at recent check; continue to recommend supplementation for goal of 60-100 Defer vitamin D level  Smoker Rare, encouraged cessation; neg CT screening 12/2020  Other abnormal glucose (prediabetes) Discussed disease and risks Discussed diet/exercise, weight management  A1C  Medication management Each visit   Hyperlipidemia CAD by imaging; LDL goal <70, titrate statin as needed Continue low cholesterol diet and exercise.  Check lipid panel.   History of adenomatous polyp of colon UTD on colonoscopy; due 2025  Overweight (BMI 25.0-29.9) Continue to recommend diet  heavy in fruits and veggies and low in animal meats, cheeses, and dairy products, appropriate calorie intake Discuss exercise recommendations routinely Continue to monitor weight at each visit      Over 30 minutes of exam, counseling, chart review, and critical decision making was performed  Future Appointments  Date Time Provider [place]  06/18/2022 10:30 AM [name], NP [facility] None  07/03/2022  8:45 AM Icard, Octavio Graves, DO [facility] None  10/30/2022  2:00 PM [name], MD [facility] None     Plan:   During the course of the visit the patient was educated and counseled about appropriate screening and preventive services including:   Pneumococcal vaccine  Influenza vaccine Prevnar 13 Td vaccine Screening electrocardiogram Colorectal cancer screening Diabetes screening Glaucoma screening Nutrition counseling    Subjective:  Douglas Barrera is a 71 y.o. male who presents for Medicare Annual Wellness Visit and 3 month follow up for HTN, hyperlipidemia, prediabetes, and vitamin D Def.   Patient has severe COPD consequent of years of smoking, controlled with MDI  & nebulized meds (perforomist) and guaifenesin. He is followed by Dr Carney Corners (Pulmonary), on continuous Oxygen therapy.  He moitors O2 sats and regulate his O2 flow rate between 2 - 3 l/min to keep his O2 sats in the low 90's range. He does admit still occasionally "sneaking " a cigar (outside away from his O2). He is on neb med.  Ct lung in 01/20/2021 showed no concerning nodules, did show emphysematous changes and aortic atherosclerosis,   Trazodone 150 mg daily at night for sleep and endorses restorative sleep.   BMI is There is no height or weight on file to calculate BMI., he has been working on diet, pushes fresh vegetables and fruits, avoids processed foods, cooks at home most of the  time.  Walks 15-20, 3-4 days a week.  Wt Readings from Last 3 Encounters:  05/06/22 136 lb 6.4 oz  (61.9 kg)  03/14/22 136 lb 3.2 oz (61.8 kg)  02/28/22 138 lb 9.6 oz (62.9 kg)   Previous CTs have shown 3 vessel CAD. He was evaluated by Dr. Gwenlyn Found in 2020 for this who had planned coronary calcium study but was having problems with insurance, then deferred due to pandemic. Denies sx, declines referral back.   His blood pressure has been controlled at home, today their BP is    He does workout. He denies chest pain, shortness of breath (none if he stays on O2) dizziness.   He is on cholesterol medication (rosuvastatin MWF) and denies myalgias. His cholesterol is not at goal LDL <70. The cholesterol last visit was:   Lab Results  Component Value Date   CHOL 139 02/13/2022   HDL 60 02/13/2022   LDLCALC 60 02/13/2022   TRIG 102 02/13/2022   CHOLHDL 2.3 02/13/2022   He has not been working on diet and exercise for prediabetes, and denies increased appetite, nausea, paresthesia of the feet, polydipsia, polyuria and visual disturbances. Last A1C in the office was:  Lab Results  Component Value Date   HGBA1C 5.6 10/24/2021   Last GFR Lab Results  Component Value Date   GFRNONAA 83 10/16/2020    Patient is on Vitamin D supplement and at goal at last check:    Lab Results  Component Value Date   VD25OH 79 10/24/2021      Medication Review:  Current Outpatient Medications (Endocrine & Metabolic):    dexamethasone (DECADRON) 1 MG tablet, Take 3 tabs for 3 days, 2 tabs for 3 days 1 tab for 5 days. Take with food.  Current Outpatient Medications (Cardiovascular):    rosuvastatin (CRESTOR) 5 MG tablet, Take 1 tab four days a week (WMFSat) in the evening for cholesterol.  Current Outpatient Medications (Respiratory):    albuterol (VENTOLIN HFA) 108 (90 Base) MCG/ACT inhaler, Inhale 1 to 2 puffs 4 x day or every 4 hours to rescue asthma   budesonide (PULMICORT) 0.5 MG/2ML nebulizer solution, Take 2 mLs (0.5 mg total) by nebulization 2 (two) times daily.   diphenhydrAMINE (BENADRYL) 25 MG  tablet, Take 50 mg by mouth daily.    fluticasone (FLONASE) 50 MCG/ACT nasal spray, Place 2 sprays into both nostrils daily. (Patient taking differently: Place 2 sprays into both nostrils every evening.)   formoterol (PERFOROMIST) 20 MCG/2ML nebulizer solution, USE 1 VIAL VIA NEBULIER TWO TIMES A DAY   promethazine-dextromethorphan (PROMETHAZINE-DM) 6.25-15 MG/5ML syrup, Take 5 mLs by mouth 4 (four) times daily as needed for cough.   revefenacin (YUPELRI) 175 MCG/3ML nebulizer solution, Take 3 mLs (175 mcg total) by nebulization daily.  Current Outpatient Medications (Analgesics):    aspirin 81 MG tablet, Take 81 mg by mouth every evening.    Current Outpatient Medications (Other):    Cholecalciferol (VITAMIN D3) 5000 units CAPS, Take 10,000 Units by mouth daily.    traZODone (DESYREL) 150 MG tablet, TAKE 1 TABLET BY MOUTH 1 HOUR BEFORE BEDTIME AS NEEDED FOR SLEEP   trolamine salicylate (ASPERCREME) 10 % cream, Apply 1 application topically as needed for muscle pain.   zinc gluconate 50 MG tablet, Take 50 mg by mouth daily.  Allergies: Allergies  Allergen Reactions   Niacin And Related Other (See Comments)    Unknown Childhood reaction.    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  Screening Tests Immunization History  Administered Date(s) Administered   Fluad Quad(high Dose 65+) 06/13/2020   Influenza Split 06/20/2013, 06/20/2014, 06/28/2015   Influenza, High Dose  Seasonal PF 06/17/2017, 05/24/2018, 06/14/2018, 06/29/2019   Influenza,inj,quad, With Preservative 07/21/2016   Influenza-Unspecified 06/17/2017   PFIZER(Purple Top)SARS-COV-2 Vaccination 09/26/2019, 10/17/2019, 06/19/2020   PPD Test 06/20/2014, 06/28/2015, 07/21/2016   Pneumococcal Conjugate-13 06/20/2014   Pneumococcal Polysaccharide-23 04/30/2009, 08/12/2017   Tdap 09/01/2009   Zoster Recombinat (Shingrix) 01/14/2022, 03/20/2022   Preventative care: Last colonoscopy: 10/2018, due 2025  Smoker - CT lung: 01/09/2021  Prior vaccinations: TD or Tdap: 2011, will defer due to cost   Influenza: 2021 at pharmacy  Pneumococcal: 2018 Prevnar13: 2015 Shingles/Zostavax: ? Had in 2017, unsure, declines shingrix Covid 19: 2/2, 2021 + booster 07/2020  Names of Other Physician/Practitioners you currently use: 1. [facility] here for primary care 2. [facility], Dr. Quay Burow, eye doctor, last visit 2022, glasses, will be seeing new doctor  3.  dentist, last visit remotely, has full dentures  Patient Care Team: [name], MD as PCP - General (Internal Medicine) Ladene Artist, MD as Consulting Physician (Gastroenterology) Garner Nash, DO as Consulting Physician (Pulmonary Disease)  Surgical: He  has a past surgical history that includes Testicle surgery; Excision basal cell carcinoma (2008); and Colonoscopy with propofol (N/A, 10/26/2018). Family His family history includes Breast cancer in his mother; Stomach cancer in his mother. Social history  He reports that he quit smoking about 3 years ago. His smoking use included cigars and cigarettes. He started smoking about 53 years ago. He has a 98.00 pack-year smoking history. He has never used smokeless tobacco. He reports that he does not drink alcohol and does not use drugs.  MEDICARE WELLNESS OBJECTIVES: Physical activity:   Cardiac risk factors:   Depression/mood screen:      10/24/2021    12:00 AM  Depression screen PHQ 2/9  Decreased Interest 0  Down, Depressed, Hopeless 0  PHQ - 2 Score 0    ADLs:     10/24/2021   12:00 AM  In your present state of health, do you have any difficulty performing the following activities:  Hearing? 0  Vision? 0  Difficulty concentrating or making decisions? 0  Walking or climbing stairs? 0  Dressing or bathing? 0  Doing errands, shopping? 0     Cognitive Testing  Alert? Yes  Normal Appearance?Yes  Oriented to person? Yes  Place? Yes   Time? Yes  Recall of three objects?  Yes  Can perform simple calculations? Yes  Displays appropriate judgment?Yes  Can read the correct time from a watch face?Yes  EOL planning:     Objective:   There were no vitals filed for this visit.   There is no height or weight on file to calculate BMI.  General Appearance:  in no apparent distress. Eyes: PERRLA, EOMs, conjunctiva no swelling or erythema Sinuses: No Frontal/maxillary tenderness ENT/Mouth: Ext aud canals clear, TMs without erythema, bulging. No erythema, swelling, or exudate on post pharynx. Hearing normal.  Neck: Supple, thyroid normal.  Respiratory: Respiratory effort normal, Decreased breath sounds throughout without rales, rhonchi, wheezing or stridor. Patient on [place] with 3 L. Cardio: RRR with no MRGs, distant heart sounds. Brisk peripheral pulses with mild edema bilaterally Abdomen: Soft, + BS.  Non tender, no guarding, rebound, hernias, masses. Lymphatics: Non tender without lymphadenopathy.  Musculoskeletal: Full ROM, 4/5 strength diffuse decreased muscle tone, Normal gait Skin: Warm, dry without rashes, lesions, ecchymosis.  Neuro: Cranial nerves intact. No cerebellar symptoms.  Psych: Awake and oriented X 3, normal affect, Insight and Judgment appropriate.  Skin: warm/dry; intact; fragile; scattered small ecchymoses to upper extremities. Left forearm with keratotic lesion, raised, approx 0.8 cm area with erythematous  base; R shin with 1 cm scabbed, faintly erythematous borders.    Medicare Attestation I have personally reviewed: The patient's medical and social history Their use of alcohol, tobacco or illicit drugs Their current medications and supplements The patient's functional ability including ADLs,fall risks, home safety risks, cognitive, and hearing and visual impairment Diet and physical activities Evidence for depression or mood disorders  The patient's weight, height, BMI, and visual acuity have been recorded in the chart.  I have made referrals, counseling, and provided education to the patient based on review of the above and I have provided the patient with a written personalized care plan for preventive services.     [name], NP   06/17/2022

## 2022-06-18 ENCOUNTER — Ambulatory Visit (INDEPENDENT_AMBULATORY_CARE_PROVIDER_SITE_OTHER): Payer: Medicare Other | Admitting: Nurse Practitioner

## 2022-06-18 ENCOUNTER — Encounter: Payer: Self-pay | Admitting: Nurse Practitioner

## 2022-06-18 VITALS — BP 103/60 | HR 103 | Temp 97.9°F | Resp 18 | Ht 64.0 in | Wt 136.4 lb

## 2022-06-18 DIAGNOSIS — F17200 Nicotine dependence, unspecified, uncomplicated: Secondary | ICD-10-CM

## 2022-06-18 DIAGNOSIS — I700 Atherosclerosis of aorta: Secondary | ICD-10-CM | POA: Diagnosis not present

## 2022-06-18 DIAGNOSIS — Z0000 Encounter for general adult medical examination without abnormal findings: Secondary | ICD-10-CM

## 2022-06-18 DIAGNOSIS — E782 Mixed hyperlipidemia: Secondary | ICD-10-CM | POA: Diagnosis not present

## 2022-06-18 DIAGNOSIS — R7309 Other abnormal glucose: Secondary | ICD-10-CM

## 2022-06-18 DIAGNOSIS — R6889 Other general symptoms and signs: Secondary | ICD-10-CM | POA: Diagnosis not present

## 2022-06-18 DIAGNOSIS — Z0001 Encounter for general adult medical examination with abnormal findings: Secondary | ICD-10-CM

## 2022-06-18 DIAGNOSIS — K219 Gastro-esophageal reflux disease without esophagitis: Secondary | ICD-10-CM

## 2022-06-18 DIAGNOSIS — E559 Vitamin D deficiency, unspecified: Secondary | ICD-10-CM

## 2022-06-18 DIAGNOSIS — E663 Overweight: Secondary | ICD-10-CM

## 2022-06-18 DIAGNOSIS — Z79899 Other long term (current) drug therapy: Secondary | ICD-10-CM | POA: Diagnosis not present

## 2022-06-18 DIAGNOSIS — J449 Chronic obstructive pulmonary disease, unspecified: Secondary | ICD-10-CM

## 2022-06-18 DIAGNOSIS — R0989 Other specified symptoms and signs involving the circulatory and respiratory systems: Secondary | ICD-10-CM | POA: Diagnosis not present

## 2022-06-18 DIAGNOSIS — I2510 Atherosclerotic heart disease of native coronary artery without angina pectoris: Secondary | ICD-10-CM

## 2022-06-18 DIAGNOSIS — Z23 Encounter for immunization: Secondary | ICD-10-CM | POA: Diagnosis not present

## 2022-06-18 DIAGNOSIS — J9611 Chronic respiratory failure with hypoxia: Secondary | ICD-10-CM

## 2022-06-18 DIAGNOSIS — Z86010 Personal history of colonic polyps: Secondary | ICD-10-CM

## 2022-06-18 DIAGNOSIS — K589 Irritable bowel syndrome without diarrhea: Secondary | ICD-10-CM

## 2022-06-18 NOTE — Patient Instructions (Signed)

## 2022-06-19 LAB — CBC WITH DIFFERENTIAL/PLATELET
Absolute Monocytes: 730 cells/uL (ref 200–950)
Basophils Absolute: 7 cells/uL (ref 0–200)
Basophils Relative: 0.1 %
Eosinophils Absolute: 102 cells/uL (ref 15–500)
Eosinophils Relative: 1.4 %
HCT: 45.2 % (ref 38.5–50.0)
Hemoglobin: 15.3 g/dL (ref 13.2–17.1)
Lymphs Abs: 1555 cells/uL (ref 850–3900)
MCH: 31.2 pg (ref 27.0–33.0)
MCHC: 33.8 g/dL (ref 32.0–36.0)
MCV: 92.2 fL (ref 80.0–100.0)
MPV: 10.9 fL (ref 7.5–12.5)
Monocytes Relative: 10 %
Neutro Abs: 4906 cells/uL (ref 1500–7800)
Neutrophils Relative %: 67.2 %
Platelets: 170 10*3/uL (ref 140–400)
RBC: 4.9 10*6/uL (ref 4.20–5.80)
RDW: 11.8 % (ref 11.0–15.0)
Total Lymphocyte: 21.3 %
WBC: 7.3 10*3/uL (ref 3.8–10.8)

## 2022-06-19 LAB — COMPLETE METABOLIC PANEL WITH GFR
AG Ratio: 1.6 (calc) (ref 1.0–2.5)
ALT: 15 U/L (ref 9–46)
AST: 16 U/L (ref 10–35)
Albumin: 4.3 g/dL (ref 3.6–5.1)
Alkaline phosphatase (APISO): 90 U/L (ref 35–144)
BUN: 18 mg/dL (ref 7–25)
CO2: 35 mmol/L — ABNORMAL HIGH (ref 20–32)
Calcium: 9.9 mg/dL (ref 8.6–10.3)
Chloride: 98 mmol/L (ref 98–110)
Creat: 0.94 mg/dL (ref 0.70–1.28)
Globulin: 2.7 g/dL (calc) (ref 1.9–3.7)
Glucose, Bld: 93 mg/dL (ref 65–99)
Potassium: 4.5 mmol/L (ref 3.5–5.3)
Sodium: 139 mmol/L (ref 135–146)
Total Bilirubin: 0.4 mg/dL (ref 0.2–1.2)
Total Protein: 7 g/dL (ref 6.1–8.1)
eGFR: 87 mL/min/{1.73_m2} (ref >=60)

## 2022-06-19 LAB — HEMOGLOBIN A1C
Hgb A1c MFr Bld: 6.2 % of total Hgb — ABNORMAL HIGH (ref <5.7)
Mean Plasma Glucose: 131 mg/dL
eAG (mmol/L): 7.3 mmol/L

## 2022-06-19 LAB — LIPID PANEL
Cholesterol: 154 mg/dL (ref <200)
HDL: 60 mg/dL (ref >=40)
LDL Cholesterol (Calc): 77 mg/dL (calc)
Non-HDL Cholesterol (Calc): 94 mg/dL (calc) (ref <130)
Total CHOL/HDL Ratio: 2.6 (calc) (ref <5.0)
Triglycerides: 85 mg/dL (ref <150)

## 2022-07-03 ENCOUNTER — Ambulatory Visit (INDEPENDENT_AMBULATORY_CARE_PROVIDER_SITE_OTHER): Payer: Medicare Other | Admitting: Pulmonary Disease

## 2022-07-03 VITALS — BP 110/60 | HR 87 | Ht 64.0 in | Wt 138.4 lb

## 2022-07-03 DIAGNOSIS — J9611 Chronic respiratory failure with hypoxia: Secondary | ICD-10-CM

## 2022-07-03 DIAGNOSIS — F17210 Nicotine dependence, cigarettes, uncomplicated: Secondary | ICD-10-CM | POA: Diagnosis not present

## 2022-07-03 DIAGNOSIS — J449 Chronic obstructive pulmonary disease, unspecified: Secondary | ICD-10-CM

## 2022-07-03 DIAGNOSIS — J479 Bronchiectasis, uncomplicated: Secondary | ICD-10-CM

## 2022-07-03 DIAGNOSIS — R911 Solitary pulmonary nodule: Secondary | ICD-10-CM | POA: Diagnosis not present

## 2022-07-03 NOTE — Progress Notes (Signed)
Synopsis: Referred in September 2019 for severe COPD by [name], MD  Subjective:   PATIENT ID: Douglas Barrera GENDER: male DOB: 1950/09/26, MRN: [id number]  No chief complaint on file.   He was diagnosed with COPD about 3-4 years ago. He quit smoking a week ago after being placed on oxygen. He feels much better after being placed on O2. He has much more energy after start O2. He has never been hospitalized for an exacerbation. Smoked for 40 years, 1 ppd.  Patient denies weight loss, fevers, chills, night sweats.  He does have sputum production.  He is able to complete most of his activities of daily living.  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  He used to travel around to local bakery's as well as a crossed many states to teach institutions and bakeries how to bake certain tasks.  He is able to go to the grocery store, pushes on buggy as well as mow his grass while riding a lawnmower.  OV 07/07/2018: Patient been doing well since last office visit.  He is trying to obtain POC.  Insurance has required requalification for O2 needs.  Presents today for walking in the office for that.  He has been using his new nebulized medications.  He is not really sure he seen much difference but he does think that his functional status is good.  Today he plans to go home and mow the grass on riding mower.  He is able to go to the grocery store.  He still is able to work in his kitchen and bake.  Overall doing well.  OV 10/13/2018: This is a 71 year old with very severe COPD.  Overall has been doing well for the past several months.  He had a low-dose lung cancer screening CT which revealed coronary calcifications and no evidence or/concern of a underlying malignancy.  This is being done by his primary care provider."  Radiology recommended a one-year follow-up.  He does have recent evaluation by cardiology that plans a CT a coronary evaluation.  Overall he has been doing well and has no significant complaints today.  He  enjoys using his nebulizers.  He plans to pick a cherry pie this weekend for his wife/Valentine's Day.  As for his current respiratory symptoms he maintains with some dyspnea on exertion.  He does have a planned colonoscopy for next week.  OV 06/30/2019: Patient doing very well today he has very severe COPD at baseline.  He was on nebulized LAMA LABA however currently only using Perforomist and as needed duo nebs.  He is stable on 3 L nasal cannula.  Able to complete most of his activities of daily living.  States that he mowed the grass last week with no trouble.  Trying to be as active as possible.  He is planning a trip to go across the country driving.  He would like to attempt to be approved for a POC concentrator that would help his travel significantly.  Having a car adapter would make this travel much simpler.  He will also plan to travel with his oxygen tanks.  He has a SpO2 device that he checks his sats regularly.  Otherwise no significant change in his respiratory symptoms baseline.  OV 12/21/19: Here for follow up regarding COPD.  Overall no complaints today.  He is at his standard baseline of 3 L.  O2 sat 89% today in the office.  He does check his sats regularly at home and usually there stable in  the high 80s to low 90s.  He is able to complete all of his activities of daily living.  He has 2 new kittens in the home.  He does have some seasonal allergies that has been taking over-the-counter antihistamines for.  He does have some questions about his oxygen needs from [place].  They requested a recertification.  Work on a ensure that he has this completed today in the office with no issues from home health agency.  OV 06/13/2020: Patient here today for follow-up regarding severe COPD.  He is currently only using Perforomist.  Has not continued use of his other triple therapy inhalers.  He did have an exacerbation 2 months ago was placed on steroids by his primary care provider.  He is trying  to stay active as he could.  His primary care provider recommended talking to me today about increasing his exercise options.  We also discussed pulmonary rehabilitation referral.  He is worried about having to drive all the way to [facility] for this if he can have something done in [place] that would make life simpler.  He has still been baking.  Recently picked a batch of [place] bars for a family get together.  From a respiratory standpoint his dyspnea on exertion is still present.  He feels like he is a little setback from where he was after his exacerbation 2 months ago not quite back at baseline.  He would like to become a little bit more active.  He does have sputum production clear whitish at times.  No other discoloration.  No fevers chills night sweats weight loss.  OV 01/02/2021: Here today for COPD follow up. No complaints today. Breathing remains stable. He is able to complete his activities of daily living. Mowing the grass. Stable using his nebulizers and he prefers using nebs over inhalers.   OV 07/10/2021: using triple therapy inhaler.  Here today for COPD follow-up.  Able to do his activities of daily living.  He plans to mow his grass this afternoon.  Prefers using nebulizers of her inhalers.  Doing very well with his current nebulizer regimen.  OV 01/08/2022: Patient here today for 20-monthfollow-up of COPD management.  Currently using triple therapy nebulized treatments.  Doing really well with that.  Has no real symptoms at this time.  Is a is able to complete most of his activities of daily living.  His weight has been stable.  He has a skin lesion on his left anterior forearm that has been present for over a year.  I told him that he should probably follow-up with dermatology or his primary care provider regarding this as this could be the start of the skin cancer.  OV 07/03/2022: doing better after having covid. He feels much better now. This was in the middle of September.  From  respiratory standpoint doing okay.  Uses nebulizers regularly.  Able to complete most of his activities of daily living.  Plans for family to come visit this holiday season.    Past Medical History:  Diagnosis Date   COPD (chronic obstructive pulmonary disease) (HCC)    GERD (gastroesophageal reflux disease)    History of elevated lipids    Hyperlipidemia    IBS (irritable bowel syndrome)    Labile hypertension    patient is not on meds for hypertension   Prediabetes    Secondary polycythemia 05/25/2018   Tubular adenoma of colon    Vitamin D deficiency      Family  History  Problem Relation Age of Onset   Breast cancer Mother    Stomach cancer Mother      Past Surgical History:  Procedure Laterality Date   BASAL CELL CARCINOMA EXCISION  2008   Left Solum   COLONOSCOPY WITH PROPOFOL N/A 10/26/2018   Procedure: COLONOSCOPY WITH PROPOFOL;  Surgeon: Ladene Artist, MD;  Location: [facility];  Service: Endoscopy;  Laterality: N/A;   TESTICLE SURGERY     testicle removed    Social History   Socioeconomic History   Marital status: Married    Spouse name: Not on file   Number of children: 0   Years of education: Not on file   Highest education level: Not on file  Occupational History   Occupation: retired  Tobacco Use   Smoking status: Former    Packs/day: 2.00    Years: 49.00    Total pack years: 98.00    Types: Cigars, Cigarettes    Start date: 10/17/1968    Quit date: 08/18/2018    Years since quitting: 3.8   Smokeless tobacco: Never   Tobacco comments:    quit smoking cigarettes in Dec 2019 but do backslide occasional, but smokes 3-4 cigars a week. small sigar on occasion 10/13/18  Substance and Sexual Activity   Alcohol use: No    Alcohol/week: 0.0 standard drinks of alcohol   Drug use: No   Sexual activity: Not on file  Other Topics Concern   Not on file  Social History Narrative   Not on file   Social Determinants of Health   Financial Resource  Strain: Not on file  Food Insecurity: Not on file  Transportation Needs: Not on file  Physical Activity: Not on file  Stress: Not on file  Social Connections: Not on file  Intimate Partner Violence: Not on file     Allergies  Allergen Reactions   Niacin And Related Other (See Comments)    Unknown Childhood reaction.     Outpatient Medications Prior to Visit  Medication Sig Dispense Refill   albuterol (VENTOLIN HFA) 108 (90 Base) MCG/ACT inhaler Inhale 1 to 2 puffs 4 x day or every 4 hours to rescue asthma 18 g 1   budesonide (PULMICORT) 0.5 MG/2ML nebulizer solution Take 2 mLs (0.5 mg total) by nebulization 2 (two) times daily. 120 mL 11   fluticasone (FLONASE) 50 MCG/ACT nasal spray Place 2 sprays into both nostrils daily. (Patient taking differently: Place 2 sprays into both nostrils every evening.) 48 g 3   formoterol (PERFOROMIST) 20 MCG/2ML nebulizer solution USE 1 VIAL VIA NEBULIER TWO TIMES A DAY 360 mL 2   revefenacin (YUPELRI) 175 MCG/3ML nebulizer solution Take 3 mLs (175 mcg total) by nebulization daily. 90 mL 11   aspirin 81 MG tablet Take 81 mg by mouth every evening.      Cholecalciferol (VITAMIN D3) 5000 units CAPS Take 10,000 Units by mouth daily.      dexamethasone (DECADRON) 1 MG tablet Take 3 tabs for 3 days, 2 tabs for 3 days 1 tab for 5 days. Take with food. (Patient not taking: Reported on 07/03/2022) 20 tablet 0   diphenhydrAMINE (BENADRYL) 25 MG tablet Take 50 mg by mouth daily.      promethazine-dextromethorphan (PROMETHAZINE-DM) 6.25-15 MG/5ML syrup Take 5 mLs by mouth 4 (four) times daily as needed for cough. (Patient not taking: Reported on 06/18/2022) 240 mL 1   rosuvastatin (CRESTOR) 5 MG tablet Take 1 tab four days a week (WMFSat) in  the evening for cholesterol. 50 tablet 3   traZODone (DESYREL) 150 MG tablet TAKE 1 TABLET BY MOUTH 1 HOUR BEFORE BEDTIME AS NEEDED FOR SLEEP 90 tablet 3   trolamine salicylate (ASPERCREME) 10 % cream Apply 1 application  topically as needed for muscle pain.     zinc gluconate 50 MG tablet Take 50 mg by mouth daily.     No facility-administered medications prior to visit.    Review of Systems  Constitutional:  Negative for chills, fever, malaise/fatigue and weight loss.  HENT:  Negative for hearing loss, sore throat and tinnitus.   Eyes:  Negative for blurred vision and double vision.  Respiratory:  Positive for shortness of breath. Negative for cough, hemoptysis, sputum production, wheezing and stridor.   Cardiovascular:  Negative for chest pain, palpitations, orthopnea, leg swelling and PND.  Gastrointestinal:  Negative for abdominal pain, constipation, diarrhea, heartburn, nausea and vomiting.  Genitourinary:  Negative for dysuria, hematuria and urgency.  Musculoskeletal:  Negative for joint pain and myalgias.  Skin:  Negative for itching and rash.  Neurological:  Negative for dizziness, tingling, weakness and headaches.  Endo/Heme/Allergies:  Negative for environmental allergies. Does not bruise/bleed easily.  Psychiatric/Behavioral:  Negative for depression. The patient is not nervous/anxious and does not have insomnia.   All other systems reviewed and are negative.    Objective:  Physical Exam Vitals reviewed.  Constitutional:      General: He is not in acute distress.    Appearance: He is well-developed.  HENT:     Head: Normocephalic and atraumatic.  Eyes:     General: No scleral icterus.    Conjunctiva/sclera: Conjunctivae normal.     Pupils: Pupils are equal, round, and reactive to light.  Neck:     Vascular: No JVD.     Trachea: No tracheal deviation.  Cardiovascular:     Rate and Rhythm: Normal rate and regular rhythm.     Heart sounds: Normal heart sounds. No murmur heard. Pulmonary:     Effort: Pulmonary effort is normal. No tachypnea, accessory muscle usage or respiratory distress.     Breath sounds: No stridor. No wheezing, rhonchi or rales.  Abdominal:     General: There  is no distension.     Palpations: Abdomen is soft.     Tenderness: There is no abdominal tenderness.  Musculoskeletal:        General: No tenderness.     Cervical back: Neck supple.  Lymphadenopathy:     Cervical: No cervical adenopathy.  Skin:    General: Skin is warm and dry.     Capillary Refill: Capillary refill takes less than 2 seconds.     Findings: No rash.  Neurological:     Mental Status: He is alert and oriented to person, place, and time.  Psychiatric:        Behavior: Behavior normal.      Vitals:   07/03/22 0852  BP: 110/60  Pulse: 87  SpO2: 90%  Weight: 138 lb 6.4 oz (62.8 kg)  Height: '5\' 4"'$  (1.626 m)   90% on 3 L nasal cannula BMI Readings from Last 3 Encounters:  07/03/22 23.76 kg/m  06/18/22 23.41 kg/m  05/06/22 23.41 kg/m   Wt Readings from Last 3 Encounters:  07/03/22 138 lb 6.4 oz (62.8 kg)  06/18/22 136 lb 6.4 oz (61.9 kg)  05/06/22 136 lb 6.4 oz (61.9 kg)     CBC    Component Value Date/Time   WBC 7.3 06/18/2022 0000  RBC 4.90 06/18/2022 0000   HGB 15.3 06/18/2022 0000   HGB 16.8 08/19/2018 1308   HCT 45.2 06/18/2022 0000   PLT 170 06/18/2022 0000   PLT 194 08/19/2018 1308   MCV 92.2 06/18/2022 0000   MCH 31.2 06/18/2022 0000   MCHC 33.8 06/18/2022 0000   RDW 11.8 06/18/2022 0000   LYMPHSABS 1,555 06/18/2022 0000   MONOABS 1.3 (H) 08/19/2018 1308   EOSABS 102 06/18/2022 0000   BASOSABS 7 06/18/2022 0000    Chest Imaging: 08/26/2017 CT chest lung cancer screening, LD CT Lung-RADS 1 radiology, recommending annual continued screening. Evidence of mild bronchial thickening, scattered areas of groundglass, areas of scarring. The patient's images have been independently reviewed by me.   09/15/2018: LDCT -  LUNG RADS 2S, with coronary calcifications  The patient's images have been independently reviewed by me.    11/01/2019 LDCT: LUNG RAD 2, continuing annual follow up, small subcentimeter nodule 63m  The patient's images have  been independently reviewed by me.       May 2022: LDCT lung rads 2 The patient's images have been independently reviewed by me.    Pulmonary Functions Testing Results:    Latest Ref Rng & Units 05/31/2018    9:50 AM 03/24/2018   12:36 PM  PFT Results  FVC-Pre L 1.83  1.72   FVC-Predicted Pre % 48  45   FVC-Post L 1.89    FVC-Predicted Post % 50    Pre FEV1/FVC % % 46  36   Post FEV1/FCV % % 34    FEV1-Pre L 0.84  0.61   FEV1-Predicted Pre % 30  22   FEV1-Post L 0.65    DLCO uncorrected ml/min/mmHg 8.83  7.09   DLCO UNC% % 34  27   DLCO corrected ml/min/mmHg 7.96  6.28   DLCO COR %Predicted % 31  24   DLVA Predicted % 52  49   TLC L  5.85   TLC % Predicted %  97   RV % Predicted %  198     FeNO: None   Pathology: None   Echocardiogram: None   Heart Catheterization: None     Assessment & Plan:   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HCC)  Bronchiectasis without complication (HCC)  Moderate cigarette smoker (10-19 per day)  Chronic hypoxemic respiratory failure (HCC)  Left upper lobe pulmonary nodule   Discussion:  This is a 71year old gentleman, severe COPD FEV1 22% predicted, chronic approximate respiratory failure on POC pulse oxygen therapy, mMRC functional class I able to complete most of his activities of daily living.  Enrolled in a lung cancer screening program.  Had a follow-up CT in August 2023.  Now scheduled for again annual imaging.  Plan: Continue lung cancer screening program Continue triple therapy nebulizer, steroid, LAMA, LABA Continue oxygen support to maintain SPO2 greater than 88% Continue annual flu shots [place] or as needed.    Current Outpatient Medications:    albuterol (VENTOLIN HFA) 108 (90 Base) MCG/ACT inhaler, Inhale 1 to 2 puffs 4 x day or every 4 hours to rescue asthma, Disp: 18 g, Rfl: 1   budesonide (PULMICORT) 0.5 MG/2ML nebulizer solution, Take 2 mLs (0.5 mg total) by nebulization 2 (two) times  daily., Disp: 120 mL, Rfl: 11   fluticasone (FLONASE) 50 MCG/ACT nasal spray, Place 2 sprays into both nostrils daily. (Patient taking differently: Place 2 sprays into both nostrils every evening.), Disp: 48 g, Rfl: 3  formoterol (PERFOROMIST) 20 MCG/2ML nebulizer solution, USE 1 VIAL VIA NEBULIER TWO TIMES A DAY, Disp: 360 mL, Rfl: 2   revefenacin (YUPELRI) 175 MCG/3ML nebulizer solution, Take 3 mLs (175 mcg total) by nebulization daily., Disp: 90 mL, Rfl: 11   aspirin 81 MG tablet, Take 81 mg by mouth every evening. , Disp: , Rfl:    Cholecalciferol (VITAMIN D3) 5000 units CAPS, Take 10,000 Units by mouth daily. , Disp: , Rfl:    dexamethasone (DECADRON) 1 MG tablet, Take 3 tabs for 3 days, 2 tabs for 3 days 1 tab for 5 days. Take with food. (Patient not taking: Reported on 07/03/2022), Disp: 20 tablet, Rfl: 0   diphenhydrAMINE (BENADRYL) 25 MG tablet, Take 50 mg by mouth daily. , Disp: , Rfl:    promethazine-dextromethorphan (PROMETHAZINE-DM) 6.25-15 MG/5ML syrup, Take 5 mLs by mouth 4 (four) times daily as needed for cough. (Patient not taking: Reported on 06/18/2022), Disp: 240 mL, Rfl: 1   rosuvastatin (CRESTOR) 5 MG tablet, Take 1 tab four days a week (WMFSat) in the evening for cholesterol., Disp: 50 tablet, Rfl: 3   traZODone (DESYREL) 150 MG tablet, TAKE 1 TABLET BY MOUTH 1 HOUR BEFORE BEDTIME AS NEEDED FOR SLEEP, Disp: 90 tablet, Rfl: 3   trolamine salicylate (ASPERCREME) 10 % cream, Apply 1 application topically as needed for muscle pain., Disp: , Rfl:    zinc gluconate 50 MG tablet, Take 50 mg by mouth daily., Disp: , Rfl:    Garner Nash, DO [facility] 07/03/2022 9:20 AM

## 2022-07-03 NOTE — Patient Instructions (Signed)
Thank you for visiting Dr. Valeta Harms at [facility]. Today we recommend the following:  Stay on current regimen Call if anything changes   Return in about 6 months (around 01/01/2023) for with APP or Dr. Valeta Harms.    Please do your part to reduce the spread of COVID-19.

## 2022-08-13 DIAGNOSIS — Z23 Encounter for immunization: Secondary | ICD-10-CM | POA: Diagnosis not present

## 2022-10-30 ENCOUNTER — Ambulatory Visit (INDEPENDENT_AMBULATORY_CARE_PROVIDER_SITE_OTHER): Payer: Medicare Other | Admitting: Internal Medicine

## 2022-10-30 ENCOUNTER — Encounter: Payer: Self-pay | Admitting: Internal Medicine

## 2022-10-30 VITALS — BP 128/72 | HR 96 | Temp 98.1°F | Ht 64.0 in | Wt 137.6 lb

## 2022-10-30 DIAGNOSIS — Z125 Encounter for screening for malignant neoplasm of prostate: Secondary | ICD-10-CM | POA: Diagnosis not present

## 2022-10-30 DIAGNOSIS — Z136 Encounter for screening for cardiovascular disorders: Secondary | ICD-10-CM | POA: Diagnosis not present

## 2022-10-30 DIAGNOSIS — K219 Gastro-esophageal reflux disease without esophagitis: Secondary | ICD-10-CM

## 2022-10-30 DIAGNOSIS — I10 Essential (primary) hypertension: Secondary | ICD-10-CM | POA: Diagnosis not present

## 2022-10-30 DIAGNOSIS — N138 Other obstructive and reflux uropathy: Secondary | ICD-10-CM | POA: Diagnosis not present

## 2022-10-30 DIAGNOSIS — Z79899 Other long term (current) drug therapy: Secondary | ICD-10-CM

## 2022-10-30 DIAGNOSIS — I700 Atherosclerosis of aorta: Secondary | ICD-10-CM | POA: Diagnosis not present

## 2022-10-30 DIAGNOSIS — N401 Enlarged prostate with lower urinary tract symptoms: Secondary | ICD-10-CM | POA: Diagnosis not present

## 2022-10-30 DIAGNOSIS — R7309 Other abnormal glucose: Secondary | ICD-10-CM | POA: Diagnosis not present

## 2022-10-30 DIAGNOSIS — J449 Chronic obstructive pulmonary disease, unspecified: Secondary | ICD-10-CM

## 2022-10-30 DIAGNOSIS — J9611 Chronic respiratory failure with hypoxia: Secondary | ICD-10-CM

## 2022-10-30 DIAGNOSIS — Z87891 Personal history of nicotine dependence: Secondary | ICD-10-CM

## 2022-10-30 DIAGNOSIS — B370 Candidal stomatitis: Secondary | ICD-10-CM

## 2022-10-30 DIAGNOSIS — E782 Mixed hyperlipidemia: Secondary | ICD-10-CM

## 2022-10-30 DIAGNOSIS — Z1211 Encounter for screening for malignant neoplasm of colon: Secondary | ICD-10-CM

## 2022-10-30 DIAGNOSIS — E559 Vitamin D deficiency, unspecified: Secondary | ICD-10-CM | POA: Diagnosis not present

## 2022-10-30 MED ORDER — FLUCONAZOLE 150 MG PO TABS: ORAL_TABLET | ORAL | 3 refills | Status: DC

## 2022-10-30 NOTE — Patient Instructions (Signed)

## 2022-10-30 NOTE — Progress Notes (Signed)
Annual  Screening/Preventative Visit  & Comprehensive Evaluation & Examination  Future Appointments  Date Time Provider Department  10/30/2022                            cpe  2:00 PM [name], MD [facility]  06/19/2023                          wellness 10:00 AM [name], NP [facility]  11/03/2023                              cpe  2:00 PM [name], MD [facility]            This very nice 72 y.o.  MWM presents for a Screening /Preventative Visit & comprehensive evaluation and management of multiple medical co-morbidities.  Patient has been followed for HTN, HLD, COPD, Prediabetes and Vitamin D Deficiency.  Chest CT scan in May 2022 showed Aortic Atherosclerosis.  Patient has severe O2 dependent COPD followed by Dr Valeta Harms.         Labile HTN predates circa 1997. Patient's BP has been controlled at home.  Today's BP is at goal - 128/72. Patient denies any cardiac symptoms as chest pain, palpitations, shortness of breath, dizziness or ankle swelling.        Patient's hyperlipidemia is controlled with diet and Rosuvastatin. Patient denies myalgias or other medication SE's. Last lipids were at goal :  Lab Results  Component Value Date   CHOL 154 06/18/2022   HDL 60 06/18/2022   LDLCALC 77 06/18/2022   TRIG 85 06/18/2022   CHOLHDL 2.6 06/18/2022         Patient has hx/o prediabetes (A1c 6.0% /2010) and patient denies reactive hypoglycemic symptoms, visual blurring, diabetic polys or paresthesias. Last A1c was not  at goal :   Lab Results  Component Value Date   HGBA1C 6.2 (H) 06/18/2022         Finally, patient has history of Vitamin D Deficiency ("24" /2008) and last vitamin D was at goal :   Lab Results  Component Value Date   VD25OH 79 10/24/2021       Current Outpatient Medications on File Prior to Visit  Medication Sig   albuterol HFA inhaler Inhale 1 to 2 puffs 4 x day or every 4 hrs to rescue asthma   aspirin 81 MG tablet Take every  evening.    budesonide (PULMICORT) 0.5 MG/2ML  Take 2 mLs  by neb 2  times daily.   VITAMIN D 5000 units CAPS Take 10,000 Units by mouth daily.    diphenhydrAMINE  25 MG tablet Take 50 mg by mouth daily.    FLONASE  nasal spray Place 2 sprays into both nostrils daily.    formoterol (PERFOROMIST) 20 MC/2ML  USE 1 VIAL VIA NEBULIER 2 x / DAY   guaifenesin 400 MG TABS tablet Take 800 mg daily.    YUPELRI 175 MCG/3ML neb soln Take 3 mLs (175 mcg total) by nebdaily.   rosuvastatin 5 MG tablet Take 1 tab four days a week (WMFSat) in the evening for cholesterol.   traZODone 150 MG tablet TAKE 1 TABLET 1 HOUR BEFORE BEDTIME AS NEEDED FOR SLEEP   ASPERCREME 10 % cream Apply as needed for muscle pain.   zinc  50 MG tablet Take  daily.  Allergies  Allergen Reactions   Niacin And Related Other    Unknown Childhood reaction.     Past Medical History:  Diagnosis Date   COPD (chronic obstructive pulmonary disease) ([place])    GERD (gastroesophageal reflux disease)    History of elevated lipids    Hyperlipidemia    IBS (irritable bowel syndrome)    Labile hypertension    patient is not on meds for hypertension   Prediabetes    Secondary polycythemia 05/25/2018   Tubular adenoma of colon    Vitamin D deficiency      Health Maintenance  Topic Date Due   COVID-19 Vaccine (4 - Booster for Pfizer series) 08/14/2020   TETANUS/TDAP  04/22/2022 (Originally 09/02/2019)   Pneumonia Vaccine 24+ Years old  Completed   INFLUENZA VACCINE  Completed   Hepatitis C Screening  Completed   HPV VACCINES  Aged Out   Zoster Vaccines- Shingrix  Discontinued     Immunization History  Administered Date(s) Administered   Fluad Quad(high Dose ) 06/13/2020   Influenza Split 06/20/2013, 06/20/2014, 06/28/2015   Influenza, High Dose  10/[place]/2018, 05/24/2018, 06/14/2018, 06/29/2019   Influenza,inj,quad 07/21/2016   Influenza 10/[place]/2018   PFIZER SARS-COV-2 Vacc 09/26/2019, 10/17/2019, 06/19/2020   PPD Test  06/20/2014, 06/28/2015, 07/21/2016   Pneumococcal -13 06/20/2014   Pneumococcal -23 04/30/2009, 08/12/2017   Tdap 09/01/2009    Last Colon - 10/26/2018 - Dr Fuller Plan  - Recc repeat in 5 years due Feb 2025  Past Surgical History:  Procedure Laterality Date   BASAL CELL CARCINOMA EXCISION  2008   Left [place]   COLONOSCOPY WITH PROPOFOL N/A 10/26/2018   COLONOSCOPY WITH PROPOFOL;  Ladene Artist, MD   TESTICLE SURGERY     testicle removed     Family History  Problem Relation Age of Onset   Breast cancer Mother    Stomach cancer Mother      Social History   Tobacco Use   Smoking status: Former    Packs/day: 2.00    Years: 49.00    Pack years: 98.00    Types: Cigars, Cigarettes    Start date: 10/17/1968    Quit date: 08/18/2018    Years since quitting: 3.1   Smokeless tobacco: Never   Tobacco comments:    quit smoking cigarettes in Dec 2019 but do backslide occasional, but smokes 3-4 cigars a week. small sigar on occasion 10/13/18  Substance Use Topics   Alcohol use: No    Alcohol/week: 0.0 standard drinks   Drug use: No      ROS Constitutional: Denies fever, chills, weight loss/gain, headaches, insomnia,  night sweats or change in appetite. Does c/o fatigue. Eyes: Denies redness, blurred vision, diplopia, discharge, itchy or watery eyes.  ENT: Denies discharge, congestion, post nasal drip, epistaxis, sore throat, earache, hearing loss, dental pain, Tinnitus, Vertigo, Sinus pain or snoring.  Cardio: Denies chest pain, palpitations, irregular heartbeat, syncope, dyspnea, diaphoresis, orthopnea, PND, claudication or edema Respiratory: denies cough, dyspnea, DOE, pleurisy, hoarseness, laryngitis or wheezing.  Gastrointestinal: Denies dysphagia, heartburn, reflux, water brash, pain, cramps, nausea, vomiting, bloating, diarrhea, constipation, hematemesis, melena, hematochezia, jaundice or hemorrhoids Genitourinary: Denies dysuria, frequency, urgency, nocturia, hesitancy,  discharge, hematuria or flank pain Musculoskeletal: Denies arthralgia, myalgia, stiffness, Jt. Swelling, pain, limp or strain/sprain. Denies Falls. Skin: Denies puritis, rash, hives, warts, acne, eczema or change in skin lesion Neuro: No weakness, tremor, incoordination, spasms, paresthesia or pain Psychiatric: Denies confusion, memory loss or sensory loss. Denies Depression. Endocrine: Denies change in  weight, skin, hair change, nocturia, and paresthesia, diabetic polys, visual blurring or hyper / hypo glycemic episodes.  Heme/Lymph: No excessive bleeding, bruising or enlarged lymph nodes.   Physical Exam  BP 128/72   Pulse 96   Temp 98.1 F (36.7 C)   Ht '5\' 4"'$  (1.626 m)   Wt 137 lb 9.6 oz (62.4 kg)   SpO2 96%   BMI 23.62 kg/m   General Appearance: Well nourished and well groomed and in no apparent distress.  Eyes: PERRLA, EOMs, conjunctiva no swelling or erythema, normal fundi and vessels. Sinuses: No frontal/maxillary tenderness ENT/Mouth: EACs patent / TMs  nl. Nares clear without erythema, swelling, mucoid exudates. Oral hygiene is good. No erythema, swelling, but does have patches of oral candidiasis . Tongue normal, non-obstructing. Tonsils not swollen or erythematous. Hearing normal.  Neck: Supple, thyroid not palpable. No bruits, nodes or JVD. Respiratory: Respiratory effort normal.  BS  decreased, equal and clear bilateral without rales, rhonci, wheezing or stridor. Cardio: Heart sounds are normal with regular rate and rhythm and no murmurs, rubs or gallops. Peripheral pulses are normal and equal bilaterally without edema. No aortic or femoral bruits. Chest: symmetric with normal excursions and percussion.  Abdomen: Soft, with Nl bowel sounds. Nontender, no guarding, rebound, hernias, masses, or organomegaly.  Lymphatics: Non tender without lymphadenopathy.  Musculoskeletal: Full ROM all peripheral extremities, joint stability, 5/5 strength, and normal gait. Skin: Warm and  dry without rashes, lesions, cyanosis, clubbing or  ecchymosis.  Neuro: Cranial nerves intact, reflexes equal bilaterally. Normal muscle tone, no cerebellar symptoms. Sensation intact.  Pysch: Alert and oriented X 3 with normal affect, insight and judgment appropriate.   Assessment and Plan  1. Essential hypertension  - EKG 12-Lead - Microalbumin / creatinine urine ratio - CBC with Differential/Platelet - COMPLETE METABOLIC PANEL WITH GFR - Magnesium - TSH - Urinalysis, Routine w reflex microscopic  2. Hyperlipidemia, mixed  - EKG 12-Lead - Lipid panel - TSH  3. Abnormal glucose  - EKG 12-Lead - Hemoglobin A1c - Insulin, random  4. Vitamin D deficiency  - VITAMIN D 25 Hydroxy   5. 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[place])   6. Chronic hypoxemic respiratory failure ([facility])   7. Gastroesophageal reflux disease  - CBC with Differential/Platelet  8. Aortic atherosclerosis ([place]) by Chest CTscan on 01/09/2021  - EKG 12-Lead  9. BPH with obstruction/lower urinary tract symptoms  - PSA - Urinalysis, Routine w reflex microscopic  10. Prostate cancer screening  - PSA  11. Screening for colorectal cancer  - POC Hemoccult Bld/Stl  12. Screening for heart disease  - EKG 12-Lead  13. Former smoker  - EKG 12-Lead  14. Medication management  - Microalbumin / creatinine urine ratio - CBC with Differential/Platelet - COMPLETE METABOLIC PANEL WITH GFR - Magnesium - Lipid panel - TSH - Hemoglobin A1c - Insulin, random - VITAMIN D 25 Hydroxy  - Urinalysis, Routine w reflex microscopic         Patient was counseled in prudent diet, weight control to achieve/maintain BMI less than 25, BP monitoring, regular exercise and medications as discussed.  Discussed med effects and SE's. Routine screening labs and tests as requested with regular follow-up as recommended. Over 40 minutes of exam, counseling, chart review and high complex critical decision making was performed   Kirtland Bouchard, MD

## 2022-10-31 LAB — PSA: PSA: 0.59 ng/mL (ref <=4.00)

## 2022-10-31 LAB — CBC WITH DIFFERENTIAL/PLATELET
Absolute Monocytes: 904 cells/uL (ref 200–950)
Basophils Absolute: 23 cells/uL (ref 0–200)
Basophils Relative: 0.3 %
Eosinophils Absolute: 61 cells/uL (ref 15–500)
Eosinophils Relative: 0.8 %
HCT: 48.1 % (ref 38.5–50.0)
Hemoglobin: 16.2 g/dL (ref 13.2–17.1)
Lymphs Abs: 1490 cells/uL (ref 850–3900)
MCH: 30.6 pg (ref 27.0–33.0)
MCHC: 33.7 g/dL (ref 32.0–36.0)
MCV: 90.9 fL (ref 80.0–100.0)
MPV: 10.8 fL (ref 7.5–12.5)
Monocytes Relative: 11.9 %
Neutro Abs: 5122 cells/uL (ref 1500–7800)
Neutrophils Relative %: 67.4 %
Platelets: 196 10*3/uL (ref 140–400)
RBC: 5.29 10*6/uL (ref 4.20–5.80)
RDW: 11.9 % (ref 11.0–15.0)
Total Lymphocyte: 19.6 %
WBC: 7.6 10*3/uL (ref 3.8–10.8)

## 2022-10-31 LAB — COMPLETE METABOLIC PANEL WITH GFR
AG Ratio: 1.5 (calc) (ref 1.0–2.5)
ALT: 15 U/L (ref 9–46)
AST: 14 U/L (ref 10–35)
Albumin: 4.3 g/dL (ref 3.6–5.1)
Alkaline phosphatase (APISO): 82 U/L (ref 35–144)
BUN: 22 mg/dL (ref 7–25)
CO2: 26 mmol/L (ref 20–32)
Calcium: 9.4 mg/dL (ref 8.6–10.3)
Chloride: 101 mmol/L (ref 98–110)
Creat: 0.86 mg/dL (ref 0.70–1.28)
Globulin: 2.8 g/dL (calc) (ref 1.9–3.7)
Glucose, Bld: 99 mg/dL (ref 65–99)
Potassium: 4.6 mmol/L (ref 3.5–5.3)
Sodium: 139 mmol/L (ref 135–146)
Total Bilirubin: 0.5 mg/dL (ref 0.2–1.2)
Total Protein: 7.1 g/dL (ref 6.1–8.1)
eGFR: 93 mL/min/{1.73_m2} (ref >=60)

## 2022-10-31 LAB — VITAMIN D 25 HYDROXY (VIT D DEFICIENCY, FRACTURES): Vit D, 25-Hydroxy: 85 ng/mL (ref 30–100)

## 2022-10-31 LAB — MICROALBUMIN / CREATININE URINE RATIO
Creatinine, Urine: 83 mg/dL (ref 20–320)
Microalb Creat Ratio: 2 mcg/mg creat (ref <30)
Microalb, Ur: 0.2 mg/dL

## 2022-10-31 LAB — URINALYSIS, ROUTINE W REFLEX MICROSCOPIC
Bilirubin Urine: NEGATIVE
Glucose, UA: NEGATIVE
Hgb urine dipstick: NEGATIVE
Ketones, ur: NEGATIVE
Leukocytes,Ua: NEGATIVE
Nitrite: NEGATIVE
Protein, ur: NEGATIVE
Specific Gravity, Urine: 1.019 (ref 1.001–1.035)
pH: 6 (ref 5.0–8.0)

## 2022-10-31 LAB — LIPID PANEL
Cholesterol: 157 mg/dL (ref <200)
HDL: 67 mg/dL (ref >=40)
LDL Cholesterol (Calc): 75 mg/dL (calc)
Non-HDL Cholesterol (Calc): 90 mg/dL (calc) (ref <130)
Total CHOL/HDL Ratio: 2.3 (calc) (ref <5.0)
Triglycerides: 66 mg/dL (ref <150)

## 2022-10-31 LAB — MAGNESIUM: Magnesium: 2 mg/dL (ref 1.5–2.5)

## 2022-10-31 LAB — INSULIN, RANDOM: Insulin: 13 u[IU]/mL

## 2022-10-31 LAB — HEMOGLOBIN A1C
Hgb A1c MFr Bld: 6.2 % of total Hgb — ABNORMAL HIGH (ref <5.7)
Mean Plasma Glucose: 131 mg/dL
eAG (mmol/L): 7.3 mmol/L

## 2022-10-31 LAB — TSH: TSH: 1.79 mIU/L (ref 0.40–4.50)

## 2022-10-31 NOTE — Progress Notes (Signed)
<><><><><><><><><><><><><><><><><><><><><><><><><><><><><><><><><> <><><><><><><><><><><><><><><><><><><><><><><><><><><><><><><><><> - Test results slightly outside the reference range are not unusual. If there is anything important, I will review this with you,  otherwise it is considered normal test values.  If you have further questions,  please do not hesitate to contact me at the office or via My Chart.  <><><><><><><><><><><><><><><><><><><><><><><><><><><><><><><><><> <><><><><><><><><><><><><><><><><><><><><><><><><><><><><><><><><>  -  A1c = 6.2%  - Blood sugar and A1c are elevated in the borderline and                                                              early or pre-diabetes range which has the same   300% increased risk for heart attack, stroke, cancer and                                                 alzheimer- type vascular dementia as full blown diabetes  But the good news is that diet, exercise with weight loss can                                                                                   cure the early diabetes at this point <><><><><><><><><><><><><><><><><><><><><><><><><><><><><><><><><>  -  It is very important that you work harder with diet by                                     avoiding all foods that are white except chicken, fish & calliflower.  - Avoid white rice  (brown & wild rice is OK),   - Avoid white potatoes  (sweet potatoes in moderation is OK),   White bread or wheat bread or anything made out of   white flour like bagels, donuts, rolls, buns, biscuits, cakes,  - pastries, cookies, pizza crust, and pasta (made from white flour & egg whites)   - vegetarian pasta or spinach or wheat pasta is OK.  - Multigrain breads like Arnold's, Pepperidge Farm or                                             multigrain sandwich thins or high fiber breads like                                              [place] bread or "Dave's Killer" breads  that are  4 to 5 grams fiber per slice !  are best.    Diet, exercise and weight loss can reverse and cure                                                                         diabetes in the early stages.    - Diet, exercise and weight loss is very important in the   control and prevention of complications of diabetes which                        affects every system in your body- cancer and Alzheimers. <><><><><><><><><><><><><><><><><><><><><><><><><><><><><><><><><>  -  PSA - Very low - No Prostate Cancer  - Great!  <><><><><><><><><><><><><><><><><><><><><><><><><><><><><><><><><>  -  Total Chol = 157   &  LDL Chol = 75   - Both  Excellent   - Very low risk for Heart Attack  / Stroke <><><><><><><><><><><><><><><><><><><><><><><><><><><><><><><><><> <><><><><><><><><><><><><><><><><><><><><><><><><><><><><><><><><>  -  Vitamin D = 85 - Excellent  !  - Please keep dose same  <><><><><><><><><><><><><><><><><><><><><><><><><><><><><><><><><>  -  All Else - CBC -  Kidneys  -  U/A - Electrolytes - Liver - Magnesium & Thyroid    - all  Normal / OK <><><><><><><><><><><><><><><><><><><><><><><><><><><><><><><><><> <><><><><><><><><><><><><><><><><><><><><><><><><><><><><><><><><>

## 2022-11-17 ENCOUNTER — Other Ambulatory Visit: Payer: Self-pay

## 2022-11-17 DIAGNOSIS — Z1211 Encounter for screening for malignant neoplasm of colon: Secondary | ICD-10-CM

## 2022-11-17 LAB — POC HEMOCCULT BLD/STL (HOME/3-CARD/SCREEN)
Card #2 Fecal Occult Blod, POC: NEGATIVE
Card #3 Fecal Occult Blood, POC: NEGATIVE
Fecal Occult Blood, POC: NEGATIVE

## 2023-01-07 ENCOUNTER — Encounter: Payer: Self-pay | Admitting: Pulmonary Disease

## 2023-01-07 ENCOUNTER — Ambulatory Visit (INDEPENDENT_AMBULATORY_CARE_PROVIDER_SITE_OTHER): Payer: Medicare Other | Admitting: Pulmonary Disease

## 2023-01-07 VITALS — BP 100/60 | HR 90 | Ht 64.0 in | Wt 138.4 lb

## 2023-01-07 DIAGNOSIS — J479 Bronchiectasis, uncomplicated: Secondary | ICD-10-CM | POA: Diagnosis not present

## 2023-01-07 DIAGNOSIS — J449 Chronic obstructive pulmonary disease, unspecified: Secondary | ICD-10-CM | POA: Diagnosis not present

## 2023-01-07 DIAGNOSIS — J9611 Chronic respiratory failure with hypoxia: Secondary | ICD-10-CM

## 2023-01-07 MED ORDER — ALBUTEROL SULFATE HFA 108 (90 BASE) MCG/ACT IN AERS: INHALATION_SPRAY | RESPIRATORY_TRACT | 11 refills | Status: DC

## 2023-01-07 NOTE — Progress Notes (Signed)
Synopsis: Referred in September 2019 for severe COPD by Lucky Cowboy, MD  Subjective:   PATIENT ID: Douglas Barrera GENDER: male DOB: 04/02/1951, MRN: [id number]  Chief Complaint  Patient presents with   Follow-up    F/up     He was diagnosed with COPD about 3-4 years ago. He quit smoking a week ago after being placed on oxygen. He feels much better after being placed on O2. He has much more energy after start O2. He has never been hospitalized for an exacerbation. Smoked for 40 years, 1 ppd.  Patient denies weight loss, fevers, chills, night sweats.  He does have sputum production.  He is able to complete most of his activities of daily living.  He is a retired Engineer, production.  He used to travel around to local bakery's as well as a crossed many states to teach institutions and bakeries how to bake certain tasks.  He is able to go to the grocery store, pushes on buggy as well as mow his grass while riding a lawnmower.  OV 07/07/2018: Patient been doing well since last office visit.  He is trying to obtain POC.  Insurance has required requalification for O2 needs.  Presents today for walking in the office for that.  He has been using his new nebulized medications.  He is not really sure he seen much difference but he does think that his functional status is good.  Today he plans to go home and mow the grass on riding mower.  He is able to go to the grocery store.  He still is able to work in his kitchen and bake.  Overall doing well.  OV 10/13/2018: This is a 72 year old with very severe COPD.  Overall has been doing well for the past several months.  He had a low-dose lung cancer screening CT which revealed coronary calcifications and no evidence or/concern of a underlying malignancy.  This is being done by his primary care provider."  Radiology recommended a one-year follow-up.  He does have recent evaluation by cardiology that plans a CT a coronary evaluation.  Overall he has been doing well and has no  significant complaints today.  He enjoys using his nebulizers.  He plans to pick a cherry pie this weekend for his wife/Valentine's Day.  As for his current respiratory symptoms he maintains with some dyspnea on exertion.  He does have a planned colonoscopy for next week.  OV 06/30/2019: Patient doing very well today he has very severe COPD at baseline.  He was on nebulized LAMA LABA however currently only using Perforomist and as needed duo nebs.  He is stable on 3 L nasal cannula.  Able to complete most of his activities of daily living.  States that he mowed the grass last week with no trouble.  Trying to be as active as possible.  He is planning a trip to go across the country driving.  He would like to attempt to be approved for a POC concentrator that would help his travel significantly.  Having a car adapter would make this travel much simpler.  He will also plan to travel with his oxygen tanks.  He has a SpO2 device that he checks his sats regularly.  Otherwise no significant change in his respiratory symptoms baseline.  OV 12/21/19: Here for follow up regarding COPD.  Overall no complaints today.  He is at his standard baseline of 3 L.  O2 sat 89% today in the office.  He does check  his sats regularly at home and usually there stable in the high 80s to low 90s.  He is able to complete all of his activities of daily living.  He has 2 new kittens in the home.  He does have some seasonal allergies that has been taking over-the-counter antihistamines for.  He does have some questions about his oxygen needs from [facility].  They requested a recertification.  Work on a ensure that he has this completed today in the office with no issues from home health agency.  OV 06/13/2020: Patient here today for follow-up regarding severe COPD.  He is currently only using Perforomist.  Has not continued use of his other triple therapy inhalers.  He did have an exacerbation 2 months ago was placed on steroids by his  primary care provider.  He is trying to stay active as he could.  His primary care provider recommended talking to me today about increasing his exercise options.  We also discussed pulmonary rehabilitation referral.  He is worried about having to drive all the way to [facility] for this if he can have something done in [place] that would make life simpler.  He has still been baking.  Recently picked a batch of [place] bars for a family get together.  From a respiratory standpoint his dyspnea on exertion is still present.  He feels like he is a little setback from where he was after his exacerbation 2 months ago not quite back at baseline.  He would like to become a little bit more active.  He does have sputum production clear whitish at times.  No other discoloration.  No fevers chills night sweats weight loss.  OV 01/02/2021: Here today for COPD follow up. No complaints today. Breathing remains stable. He is able to complete his activities of daily living. Mowing the grass. Stable using his nebulizers and he prefers using nebs over inhalers.   OV 07/10/2021: using triple therapy inhaler.  Here today for COPD follow-up.  Able to do his activities of daily living.  He plans to mow his grass this afternoon.  Prefers using nebulizers of her inhalers.  Doing very well with his current nebulizer regimen.  OV 01/08/2022: Patient here today for 90-month follow-up of COPD management.  Currently using triple therapy nebulized treatments.  Doing really well with that.  Has no real symptoms at this time.  Is a is able to complete most of his activities of daily living.  His weight has been stable.  He has a skin lesion on his left anterior forearm that has been present for over a year.  I told him that he should probably follow-up with dermatology or his primary care provider regarding this as this could be the start of the skin cancer.  OV 07/03/2022: doing better after having covid. He feels much better now. This was  in the middle of September.  From respiratory standpoint doing okay.  Uses nebulizers regularly.  Able to complete most of his activities of daily living.  Plans for family to come visit this holiday season.  OV 01/07/2023: Here today for follow-up.  He has ongoing management needs for his COPD.  He currently on triple therapy nebulizer regimen.  Rarely uses albuterol but he does need a refill.  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  He feels like he is still able to complete his activities of daily living.     Past Medical History:  Diagnosis Date  COPD (chronic obstructive pulmonary disease) (HCC)    GERD (gastroesophageal reflux disease)    History of elevated lipids    Hyperlipidemia    IBS (irritable bowel syndrome)    Labile hypertension    patient is not on meds for hypertension   Prediabetes    Secondary polycythemia 05/25/2018   Tubular adenoma of colon    Vitamin D deficiency      Family History  Problem Relation Age of Onset   Breast cancer Mother    Stomach cancer Mother      Past Surgical History:  Procedure Laterality Date   BASAL CELL CARCINOMA EXCISION  2008   Left [place]   COLONOSCOPY WITH PROPOFOL N/A 10/26/2018   Procedure: COLONOSCOPY WITH PROPOFOL;  Surgeon: Meryl Dare, MD;  Location: [facility];  Service: Endoscopy;  Laterality: N/A;   TESTICLE SURGERY     testicle removed    Social History   Socioeconomic History   Marital status: Married    Spouse name: Not on file   Number of children: 0   Years of education: Not on file   Highest education level: Not on file  Occupational History   Occupation: retired  Tobacco Use   Smoking status: Former    Packs/day: 2.00    Years: 49.00    Additional pack years: 0.00    Total pack years: 98.00    Types: Cigars, Cigarettes    Start date: 10/17/1968    Quit date: 08/18/2018    Years since quitting: 4.3   Smokeless tobacco: Never   Tobacco comments:    quit  smoking cigarettes in Dec 2019 but do backslide occasional, but smokes 3-4 cigars a week. small sigar on occasion 10/13/18  Substance and Sexual Activity   Alcohol use: No    Alcohol/week: 0.0 standard drinks of alcohol   Drug use: No   Sexual activity: Not on file  Other Topics Concern   Not on file  Social History Narrative   Not on file   Social Determinants of Health   Financial Resource Strain: Not on file  Food Insecurity: Not on file  Transportation Needs: Not on file  Physical Activity: Not on file  Stress: Not on file  Social Connections: Not on file  Intimate Partner Violence: Not on file     Allergies  Allergen Reactions   Niacin And Related Other (See Comments)    Unknown Childhood reaction.     Outpatient Medications Prior to Visit  Medication Sig Dispense Refill   aspirin 81 MG tablet Take 81 mg by mouth every evening.      budesonide (PULMICORT) 0.5 MG/2ML nebulizer solution Take 2 mLs (0.5 mg total) by nebulization 2 (two) times daily. 120 mL 11   Cholecalciferol (VITAMIN D3) 5000 units CAPS Take 10,000 Units by mouth daily.      diphenhydrAMINE (BENADRYL) 25 MG tablet Take 50 mg by mouth daily.      fluconazole (DIFLUCAN) 150 MG tablet Take 1 tablet 2 x /week  if needed for yeast infection 24 tablet 3   fluticasone (FLONASE) 50 MCG/ACT nasal spray Place 2 sprays into both nostrils daily. (Patient taking differently: Place 2 sprays into both nostrils every evening.) 48 g 3   formoterol (PERFOROMIST) 20 MCG/2ML nebulizer solution USE 1 VIAL VIA NEBULIER TWO TIMES A DAY 360 mL 2   revefenacin (YUPELRI) 175 MCG/3ML nebulizer solution Take 3 mLs (175 mcg total) by nebulization daily. 90 mL 11   rosuvastatin (CRESTOR) 5 MG  tablet Take 1 tab four days a week (WMFSat) in the evening for cholesterol. 50 tablet 3   traZODone (DESYREL) 150 MG tablet TAKE 1 TABLET BY MOUTH 1 HOUR BEFORE BEDTIME AS NEEDED FOR SLEEP 90 tablet 3   trolamine salicylate (ASPERCREME) 10 % cream  Apply 1 application topically as needed for muscle pain.     zinc gluconate 50 MG tablet Take 50 mg by mouth daily.     albuterol (VENTOLIN HFA) 108 (90 Base) MCG/ACT inhaler Inhale 1 to 2 puffs 4 x day or every 4 hours to rescue asthma 18 g 1   No facility-administered medications prior to visit.    Review of Systems  Constitutional:  Negative for chills, fever, malaise/fatigue and weight loss.  HENT:  Negative for hearing loss, sore throat and tinnitus.   Eyes:  Negative for blurred vision and double vision.  Respiratory:  Positive for shortness of breath. Negative for cough, hemoptysis, sputum production, wheezing and stridor.   Cardiovascular:  Negative for chest pain, palpitations, orthopnea, leg swelling and PND.  Gastrointestinal:  Negative for abdominal pain, constipation, diarrhea, heartburn, nausea and vomiting.  Genitourinary:  Negative for dysuria, hematuria and urgency.  Musculoskeletal:  Negative for joint pain and myalgias.  Skin:  Negative for itching and rash.  Neurological:  Negative for dizziness, tingling, weakness and headaches.  Endo/Heme/Allergies:  Negative for environmental allergies. Does not bruise/bleed easily.  Psychiatric/Behavioral:  Negative for depression. The patient is not nervous/anxious and does not have insomnia.   All other systems reviewed and are negative.    Objective:  Physical Exam Vitals reviewed.  Constitutional:      General: He is not in acute distress.    Appearance: He is well-developed.  HENT:     Head: Normocephalic and atraumatic.     Mouth/Throat:     Pharynx: No oropharyngeal exudate.  Eyes:     Conjunctiva/sclera: Conjunctivae normal.     Pupils: Pupils are equal, round, and reactive to light.  Neck:     Vascular: No JVD.     Trachea: No tracheal deviation.     Comments: Loss of supraclavicular fat Cardiovascular:     Rate and Rhythm: Normal rate and regular rhythm.     Heart sounds: S1 normal and S2 normal.      Comments: Distant heart tones Pulmonary:     Effort: No tachypnea or accessory muscle usage.     Breath sounds: No stridor. Decreased breath sounds (throughout all lung fields) present. No wheezing, rhonchi or rales.     Comments: Diminished breath sounds bilaterally Abdominal:     General: Bowel sounds are normal. There is no distension.     Palpations: Abdomen is soft.     Tenderness: There is no abdominal tenderness.  Musculoskeletal:        General: Deformity (muscle wasting ) present.  Skin:    General: Skin is warm and dry.     Capillary Refill: Capillary refill takes less than 2 seconds.     Findings: No rash.  Neurological:     Mental Status: He is alert and oriented to person, place, and time.  Psychiatric:        Behavior: Behavior normal.      Vitals:   01/07/23 0826  BP: 100/60  Pulse: 90  SpO2: 93%  Weight: 138 lb 6.4 oz (62.8 kg)  Height: 5\' 4"  (1.626 m)   93% on 3 L nasal cannula BMI Readings from Last 3 Encounters:  01/07/23 23.76  kg/m  10/30/22 23.62 kg/m  07/03/22 23.76 kg/m   Wt Readings from Last 3 Encounters:  01/07/23 138 lb 6.4 oz (62.8 kg)  10/30/22 137 lb 9.6 oz (62.4 kg)  07/03/22 138 lb 6.4 oz (62.8 kg)     CBC    Component Value Date/Time   WBC 7.6 10/30/2022 1517   RBC 5.29 10/30/2022 1517   HGB 16.2 10/30/2022 1517   HGB 16.8 08/19/2018 1308   HCT 48.1 10/30/2022 1517   PLT 196 10/30/2022 1517   PLT 194 08/19/2018 1308   MCV 90.9 10/30/2022 1517   MCH 30.6 10/30/2022 1517   MCHC 33.7 10/30/2022 1517   RDW 11.9 10/30/2022 1517   LYMPHSABS 1,490 10/30/2022 1517   MONOABS 1.3 (H) 08/19/2018 1308   EOSABS 61 10/30/2022 1517   BASOSABS 23 10/30/2022 1517    Chest Imaging: 08/26/2017 CT chest lung cancer screening, LD CT Lung-RADS 1 radiology, recommending annual continued screening. Evidence of mild bronchial thickening, scattered areas of groundglass, areas of scarring. The patient's images have been independently  reviewed by me.   09/15/2018: LDCT -  LUNG RADS 2S, with coronary calcifications  The patient's images have been independently reviewed by me.    11/01/2019 LDCT: LUNG RAD 2, continuing annual follow up, small subcentimeter nodule 5mm  The patient's images have been independently reviewed by me.       May 2022: LDCT lung rads 2 The patient's images have been independently reviewed by me.    August 2023 LDCT, lung RADS 2 Areas of bronchiectasis and emphysema. The patient's images have been independently reviewed by me.    Pulmonary Functions Testing Results:    Latest Ref Rng & Units 05/31/2018    9:50 AM 03/24/2018   12:36 PM  PFT Results  FVC-Pre L 1.83  1.72   FVC-Predicted Pre % 48  45   FVC-Post L 1.89    FVC-Predicted Post % 50    Pre FEV1/FVC % % 46  36   Post FEV1/FCV % % 34    FEV1-Pre L 0.84  0.61   FEV1-Predicted Pre % 30  22   FEV1-Post L 0.65    DLCO uncorrected ml/min/mmHg 8.83  7.09   DLCO UNC% % 34  27   DLCO corrected ml/min/mmHg 7.96  6.28   DLCO COR %Predicted % 31  24   DLVA Predicted % 52  49   TLC L  5.85   TLC % Predicted %  97   RV % Predicted %  198     FeNO: None   Pathology: None   Echocardiogram: None   Heart Catheterization: None     Assessment & Plan:   Bronchiectasis without complication (HCC)  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HCC) - Plan: albuterol (VENTOLIN HFA) 108 (90 Base) MCG/ACT inhaler  Chronic hypoxemic respiratory failure (HCC)   Discussion:  This is a 72 year old gentleman severe COPD, FEV1 22% predicted, chronic hypoxemic respiratory failure with POC oxygen supplementation, mMRC functional class I able to complete his activities of daily living.  He is enrolled in lung cancer screening program.  His next scan is due for August 2024.  Plan: Continue triple therapy nebulizer regimen, Pulmicort Yupelri and Perforomist. Continue annual lung cancer screening Continue O2 supplementation to maintain sats above 88%, he is doing well with  this POC. Continue annual flu shot recommendations Follow-up with [place] for COPD management in 6 months, can see me or SG, NP.    Current Outpatient Medications:  aspirin 81 MG tablet, Take 81 mg by mouth every evening. , Disp: , Rfl:    budesonide (PULMICORT) 0.5 MG/2ML nebulizer solution, Take 2 mLs (0.5 mg total) by nebulization 2 (two) times daily., Disp: 120 mL, Rfl: 11   Cholecalciferol (VITAMIN D3) 5000 units CAPS, Take 10,000 Units by mouth daily. , Disp: , Rfl:    diphenhydrAMINE (BENADRYL) 25 MG tablet, Take 50 mg by mouth daily. , Disp: , Rfl:    fluconazole (DIFLUCAN) 150 MG tablet, Take 1 tablet 2 x /week  if needed for yeast infection, Disp: 24 tablet, Rfl: 3   fluticasone (FLONASE) 50 MCG/ACT nasal spray, Place 2 sprays into both nostrils daily. (Patient taking differently: Place 2 sprays into both nostrils every evening.), Disp: 48 g, Rfl: 3   formoterol (PERFOROMIST) 20 MCG/2ML nebulizer solution, USE 1 VIAL VIA NEBULIER TWO TIMES A DAY, Disp: 360 mL, Rfl: 2   revefenacin (YUPELRI) 175 MCG/3ML nebulizer solution, Take 3 mLs (175 mcg total) by nebulization daily., Disp: 90 mL, Rfl: 11   rosuvastatin (CRESTOR) 5 MG tablet, Take 1 tab four days a week (WMFSat) in the evening for cholesterol., Disp: 50 tablet, Rfl: 3   traZODone (DESYREL) 150 MG tablet, TAKE 1 TABLET BY MOUTH 1 HOUR BEFORE BEDTIME AS NEEDED FOR SLEEP, Disp: 90 tablet, Rfl: 3   trolamine salicylate (ASPERCREME) 10 % cream, Apply 1 application topically as needed for muscle pain., Disp: , Rfl:    zinc gluconate 50 MG tablet, Take 50 mg by mouth daily., Disp: , Rfl:    albuterol (VENTOLIN HFA) 108 (90 Base) MCG/ACT inhaler, Inhale 1 to 2 puffs 4 x day or every 4 hours to rescue asthma, Disp: 18 g, Rfl: 11   Josephine Igo, DO [facility] 01/07/2023 8:43 AM

## 2023-01-07 NOTE — Patient Instructions (Addendum)
Thank you for visiting Dr. Tonia Brooms at [facility]. Today we recommend the following:  Continue current nebulizer medications  LDCT screening in Aug 2024  Return in about 6 months (around 07/10/2023) for with Kandice Robinsons, NP, or Dr. Tonia Brooms.    Please do your part to reduce the spread of COVID-19.

## 2023-01-22 ENCOUNTER — Telehealth: Payer: Self-pay | Admitting: Pulmonary Disease

## 2023-01-22 DIAGNOSIS — J9611 Chronic respiratory failure with hypoxia: Secondary | ICD-10-CM

## 2023-01-22 DIAGNOSIS — J449 Chronic obstructive pulmonary disease, unspecified: Secondary | ICD-10-CM

## 2023-01-22 MED ORDER — BUDESONIDE 0.5 MG/2ML IN SUSP: 0.5000 mg | Freq: Two times a day (BID) | RESPIRATORY_TRACT | 11 refills | Status: AC

## 2023-01-22 MED ORDER — YUPELRI 175 MCG/3ML IN SOLN: 175.0000 ug | Freq: Every day | RESPIRATORY_TRACT | 11 refills | Status: AC

## 2023-01-22 MED ORDER — FORMOTEROL FUMARATE 20 MCG/2ML IN NEBU: INHALATION_SOLUTION | RESPIRATORY_TRACT | 11 refills | Status: AC

## 2023-01-22 NOTE — Telephone Encounter (Signed)
Brooke states patient needs refill for Nebulizer solutions. Pharmacy is [facility]. Brooke phone number is [phone].

## 2023-01-22 NOTE — Telephone Encounter (Signed)
Rx for pt's neb solutions have been sent to [facility]'s pharmacy for pt. Nothing further needed.

## 2023-01-27 ENCOUNTER — Encounter: Payer: Self-pay | Admitting: Internal Medicine

## 2023-01-28 ENCOUNTER — Telehealth: Payer: Self-pay

## 2023-01-28 NOTE — Telephone Encounter (Signed)
Douglas Barrera, from [employer], called [place] saying that [employer] will not cover a new nebulizer. I called patient to let him know. Dr. Oneta Rack suggested he look for one on [employer]. Patient will let us know if he has any questions when looking for one.

## 2023-02-03 DIAGNOSIS — H43813 Vitreous degeneration, bilateral: Secondary | ICD-10-CM | POA: Diagnosis not present

## 2023-02-09 NOTE — Progress Notes (Unsigned)
3 MONTH FOLLOW UP Assessment:    Diagnoses and all orders for this visit:  Essential hypertension Controlled off of medications Monitor blood pressure at home; call if consistently over 130/80 Continue DASH diet.   Reminder to go to the ER if any CP, SOB, nausea, dizziness, severe HA, changes vision/speech, left arm numbness and tingling and jaw pain.  Coronary atherosclerosis due to calcified coronary lesion Per CT, has seen Dr. Allyson Sabal but declined further workup Continue ASA, discussed LDL goal <70, titrate statin  Denies angina sx Control blood pressure, cholesterol, glucose, increase exercise.   Aortic atherosclerosis (HCC) Per numerous CTs Control blood pressure, cholesterol, glucose, increase exercise.   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; continue neb/inhalers; O2; counseled to stop smoking  Chronic hypoxemic respiratory failure (HCC) On O2; monitor   Vitamin D deficiency At goal at recent check; continue to recommend supplementation for goal of 60-100 Defer vitamin D level  Smoker Rare, encouraged cessation; pulm following lung cancer screening   Abnormal glucose (hx of prediabetes) Recent A1Cs at goal Discussed diet/exercise, weight management  Defer A1C; check CMP  Medication management Each visit   Hyperlipidemia CAD by imaging; LDL goal <70, titrate statin as needed - anticipate pending lab results increasing rosuvastatin 5 mg from 4 days a week to daily  Continue low cholesterol diet and exercise.  Check lipid panel.   History of adenomatous polyp of colon UTD on colonoscopy; due 2025        Over 30 minutes of exam, counseling, chart review, and critical decision making was performed  Future Appointments  Date Time Provider Department Center  02/10/2023  9:45 AM Raynelle Dick, NP [facility] None  04/27/2023 11:00 AM [facility] 1 [facility]  06/19/2023 10:00 AM Raynelle Dick, NP [facility] None  09/21/2023  9:30 AM  Lucky Cowboy, MD [facility] None  12/21/2023 10:00 AM Lucky Cowboy, MD [facility] None    Subjective:  Douglas Barrera is a 72 y.o. male who presents for 3 month follow up for HTN, hyperlipidemia, glucose management, and vitamin D Def.   Patient has severe COPD consequent of years of smoking, controlled with managed by triple therapy nebulized meds (perforomist, yupelri, budesonide) and guaifenesin.   He is followed by Dr. Wendall Mola (Pulmonary), on continuous Oxygen therapy.  He moitors O2 sats and regulate his O2 flow rate between 2 - 3 l/min to keep his O2 sats in the low 90's range. He does admit still occasionally "sneaking " a cigar (outside away from his O2). He is on neb med.   Ct lung in 01/09/2022 showed suspicious 6.8 mm RUL nodule, pulm has 3 month follow up CT planned.   Trazodone 150 mg daily at night for sleep and endorses restorative sleep.   BMI is There is no height or weight on file to calculate BMI., he has been working on diet, pushes fresh vegetables and fruits, avoids processed foods, cooks at home most of the time.  Walks 15-20, 3-4 days a week.  Wt Readings from Last 3 Encounters:  01/07/23 138 lb 6.4 oz (62.8 kg)  10/30/22 137 lb 9.6 oz (62.4 kg)  07/03/22 138 lb 6.4 oz (62.8 kg)   He has known 3 vessel CAD and severe aortic atherosclerosis per numerous CTs. He was evaluated by Dr. Allyson Sabal in 2020 for this who had planned coronary calcium study but was having problems with insurance, then deferred due to pandemic. Denies sx, declines referral back.   His  blood pressure has been controlled at home, today their BP is    He does workout. He denies chest pain, shortness of breath (none if he stays on O2), dizziness.   He is on cholesterol medication (rosuvastatin 4 days a week) and denies myalgias (denies hx of statin intolerance). His cholesterol is not at goal LDL <70. The cholesterol last visit was:   Lab Results  Component Value Date   CHOL 157 10/30/2022    HDL 67 10/30/2022   LDLCALC 75 10/30/2022   TRIG 66 10/30/2022   CHOLHDL 2.3 10/30/2022   He has not been working on diet and exercise for hx of prediabetes, and denies increased appetite, nausea, paresthesia of the feet, polydipsia, polyuria and visual disturbances. Last A1C in the office was:  Lab Results  Component Value Date   HGBA1C 6.2 (H) 10/30/2022   Last GFR Lab Results  Component Value Date   EGFR 93 10/30/2022    Patient is on Vitamin D supplement and at goal at last check:    Lab Results  Component Value Date   VD25OH 85 10/30/2022      Medication Review:   Current Outpatient Medications (Cardiovascular):    rosuvastatin (CRESTOR) 5 MG tablet, Take 1 tab four days a week (WMFSat) in the evening for cholesterol.  Current Outpatient Medications (Respiratory):    albuterol (VENTOLIN HFA) 108 (90 Base) MCG/ACT inhaler, Inhale 1 to 2 puffs 4 x day or every 4 hours to rescue asthma   budesonide (PULMICORT) 0.5 MG/2ML nebulizer solution, Take 2 mLs (0.5 mg total) by nebulization 2 (two) times daily.   diphenhydrAMINE (BENADRYL) 25 MG tablet, Take 50 mg by mouth daily.    fluticasone (FLONASE) 50 MCG/ACT nasal spray, Place 2 sprays into both nostrils daily. (Patient taking differently: Place 2 sprays into both nostrils every evening.)   formoterol (PERFOROMIST) 20 MCG/2ML nebulizer solution, USE 1 VIAL VIA NEBULIER TWO TIMES A DAY   revefenacin (YUPELRI) 175 MCG/3ML nebulizer solution, Take 3 mLs (175 mcg total) by nebulization daily.  Current Outpatient Medications (Analgesics):    aspirin 81 MG tablet, Take 81 mg by mouth every evening.    Current Outpatient Medications (Other):    Cholecalciferol (VITAMIN D3) 5000 units CAPS, Take 10,000 Units by mouth daily.    fluconazole (DIFLUCAN) 150 MG tablet, Take 1 tablet 2 x /week  if needed for yeast infection   traZODone (DESYREL) 150 MG tablet, TAKE 1 TABLET BY MOUTH 1 HOUR BEFORE BEDTIME AS NEEDED FOR SLEEP   trolamine  salicylate (ASPERCREME) 10 % cream, Apply 1 application topically as needed for muscle pain.   zinc gluconate 50 MG tablet, Take 50 mg by mouth daily.  Allergies: Allergies  Allergen Reactions   Niacin And Related Other (See Comments)    Unknown Childhood reaction.    Current Problems (verified) has 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HCC); Labile hypertension; Hyperlipidemia; GERD (gastroesophageal reflux disease); Other abnormal glucose (prediabetes); IBS (irritable bowel syndrome); Vitamin D deficiency; Medication management; Smoker; Aortic atherosclerosis (HCC) by Chest CTscan on 01/09/2021; Coronary atherosclerosis by Chest CT scan on 11/01/2019; Chronic hypoxemic respiratory failure (HCC); History of adenomatous polyp of colon; Right upper lobe pulmonary nodule; Skin lesion of right leg; and Skin lesion of left arm on their problem list.  Surgical: He  has a past surgical history that includes Testicle surgery; Excision basal cell carcinoma (2008); and Colonoscopy with propofol (N/A, 10/26/2018). Family His family history includes Breast cancer in his mother; Stomach cancer in his mother. Social  history  He reports that he quit smoking about 4 years ago. His smoking use included cigars and cigarettes. He started smoking about 54 years ago. He has a 98.00 pack-year smoking history. He has never used smokeless tobacco. He reports that he does not drink alcohol and does not use drugs.   Review of Systems  Constitutional:  Negative for malaise/fatigue and weight loss.  HENT:  Negative for hearing loss and tinnitus.   Eyes:  Negative for blurred vision and double vision.  Respiratory:  Positive for shortness of breath (exertional, chronic, stable). Negative for cough, sputum production and wheezing.   Cardiovascular:  Negative for chest pain, palpitations, orthopnea, claudication and leg swelling.  Gastrointestinal:  Negative for abdominal pain, blood in stool, constipation, diarrhea, heartburn, melena,  nausea and vomiting.  Genitourinary: Negative.   Musculoskeletal:  Negative for joint pain and myalgias.  Skin:  Negative for rash.  Neurological:  Negative for dizziness, tingling, sensory change, weakness and headaches.  Endo/Heme/Allergies:  Negative for polydipsia.  Psychiatric/Behavioral: Negative.    All other systems reviewed and are negative.     Objective:   There were no vitals filed for this visit.    There is no height or weight on file to calculate BMI.  General Appearance:  in no apparent distress. Eyes: PERRLA, EOMs, conjunctiva no swelling or erythema Sinuses: No Frontal/maxillary tenderness ENT/Mouth: Ext aud canals clear, TMs without erythema, bulging. No erythema, swelling, or exudate on post pharynx. Hearing normal.  Neck: Supple, thyroid normal.  Respiratory: Respiratory effort normal, Decreased breath sounds throughout without rales, rhonchi, wheezing or stridor. Patient on [place] with 3 L. Cardio: RRR with no MRGs, distant heart sounds. Brisk peripheral pulses with mild edema bilaterally Abdomen: Soft, + BS.  Non tender, no guarding, rebound, hernias, masses. Lymphatics: Non tender without lymphadenopathy.  Musculoskeletal: Full ROM, 4/5 strength diffuse decreased muscle tone, Normal gait Skin: Warm, dry without rashes, lesions, ecchymosis.  Neuro: Cranial nerves intact. No cerebellar symptoms.  Psych: Awake and oriented X 3, normal affect, Insight and Judgment appropriate.  Skin: warm/dry; intact; fragile; scattered small ecchymoses to upper extremities. Left forearm with keratotic lesion, raised, approx 0.8 cm area with erythematous base; R shin with 1 cm scabbed, faintly erythematous borders.         Raynelle Dick, NP   02/09/2023

## 2023-02-10 ENCOUNTER — Encounter: Payer: Self-pay | Admitting: Nurse Practitioner

## 2023-02-10 ENCOUNTER — Ambulatory Visit (INDEPENDENT_AMBULATORY_CARE_PROVIDER_SITE_OTHER): Payer: Medicare Other | Admitting: Nurse Practitioner

## 2023-02-10 VITALS — BP 112/68 | HR 86 | Temp 97.7°F | Ht 64.0 in | Wt 138.6 lb

## 2023-02-10 DIAGNOSIS — I2584 Coronary atherosclerosis due to calcified coronary lesion: Secondary | ICD-10-CM

## 2023-02-10 DIAGNOSIS — R7309 Other abnormal glucose: Secondary | ICD-10-CM

## 2023-02-10 DIAGNOSIS — I700 Atherosclerosis of aorta: Secondary | ICD-10-CM

## 2023-02-10 DIAGNOSIS — I2510 Atherosclerotic heart disease of native coronary artery without angina pectoris: Secondary | ICD-10-CM

## 2023-02-10 DIAGNOSIS — J9611 Chronic respiratory failure with hypoxia: Secondary | ICD-10-CM

## 2023-02-10 DIAGNOSIS — E559 Vitamin D deficiency, unspecified: Secondary | ICD-10-CM | POA: Diagnosis not present

## 2023-02-10 DIAGNOSIS — Z79899 Other long term (current) drug therapy: Secondary | ICD-10-CM

## 2023-02-10 DIAGNOSIS — F17200 Nicotine dependence, unspecified, uncomplicated: Secondary | ICD-10-CM

## 2023-02-10 DIAGNOSIS — J301 Allergic rhinitis due to pollen: Secondary | ICD-10-CM

## 2023-02-10 DIAGNOSIS — E782 Mixed hyperlipidemia: Secondary | ICD-10-CM

## 2023-02-10 DIAGNOSIS — I10 Essential (primary) hypertension: Secondary | ICD-10-CM | POA: Diagnosis not present

## 2023-02-10 DIAGNOSIS — L03114 Cellulitis of left upper limb: Secondary | ICD-10-CM | POA: Diagnosis not present

## 2023-02-10 DIAGNOSIS — J449 Chronic obstructive pulmonary disease, unspecified: Secondary | ICD-10-CM | POA: Diagnosis not present

## 2023-02-10 LAB — CBC WITH DIFFERENTIAL/PLATELET
Basophils Absolute: 30 cells/uL (ref 0–200)
Basophils Relative: 0.4 %
Eosinophils Absolute: 37 cells/uL (ref 15–500)
HCT: 45.1 % (ref 38.5–50.0)
Lymphs Abs: 1199 cells/uL (ref 850–3900)
MPV: 10.1 fL (ref 7.5–12.5)
Neutro Abs: 5446 cells/uL (ref 1500–7800)
Total Lymphocyte: 16.2 %

## 2023-02-10 MED ORDER — DOXYCYCLINE HYCLATE 100 MG PO CAPS
ORAL_CAPSULE | ORAL | 0 refills | Status: DC
Start: 2023-02-10 — End: 2023-08-20

## 2023-02-10 MED ORDER — DEXAMETHASONE 1 MG PO TABS
ORAL_TABLET | ORAL | 0 refills | Status: DC
Start: 2023-02-10 — End: 2023-03-23

## 2023-02-10 NOTE — Patient Instructions (Signed)
Insect Bite, Adult An insect bite can make your skin red, itchy, and swollen. Some insects can spread disease to people with a bite. However, most insect bites do not lead to disease, and most are not serious. What are the causes? Insects may bite for many reasons, including: Hunger. To defend themselves. Insects that bite include: Spiders. Mosquitoes. Flies. Ticks and fleas. Ants. Kissing bugs. Chiggers. What are the signs or symptoms? Symptoms often last for 2-4 days. However, itching can last up to 10 days. Symptoms include: Itching or pain in the bite area. Redness and swelling in the bite area. An open wound. In rare cases, a person may have a very bad allergic reaction (anaphylactic reaction) to a bite. Symptoms of an anaphylactic reaction may include: Feeling warm in the face (flushed). Your face may turn red. Itchy, red, swollen areas of skin (hives). Swelling of the eyes, lips, face, mouth, tongue, or throat. Trouble with breathing, talking, or swallowing. High-pitched whistling sounds, most often when breathing out (wheezing). Feeling dizzy or light-headed. Fainting. Pain or cramps in your belly (abdomen). Vomiting. Watery poop (diarrhea). How is this treated? Most insect bites are not serious. Symptoms often go away on their own. When treatment is advised, it may include: Putting ice on the bite area. Putting a cream or lotion, like calamine lotion, on the bite area. This helps with itching. Using medicines called antihistamines. You may also need: A tetanus shot if you are not up to date. An antibiotic cream or medicine. This treatment is needed if the bite area gets infected. Follow these instructions at home: Bite area care  Do not scratch the bite area. It may help to cover the bite area with a bandage or close-fitting clothing. Keep the bite area clean and dry. Check the bite area every day for signs of infection. Check for: More redness, swelling, or  pain. Fluid or blood. Warmth. Pus or a bad smell. Wash your hands often. Managing pain, itching, and swelling  You may put any of these on the bite area as told by your doctor: A paste made of baking soda and water. Cortisone cream. Calamine lotion. If told, put ice on the bite area. To do this: Put ice in a plastic bag. Place a towel between your skin and the bag. Leave the ice on for 20 minutes, 2-3 times a day. If your skin turns bright red, take off the ice right away to prevent skin damage. The risk of skin damage is higher if you cannot feel pain, heat, or cold. General instructions Apply or take over-the-counter and prescription medicines only as told by your doctor. If you were prescribed antibiotics, take or apply them as told by your doctor. Do not stop using them even if you start to feel better. How is this prevented? To help you have a lower risk of insect bites: When you are outside, wear clothes that cover your arms and legs. Use insect repellent. The best insect repellents contain one of these: DEET. Picaridin. Oil of lemon eucalyptus (OLE). IR3535. Consider spraying your clothing with a pesticide called permethrin. Permethrin helps prevent insect bites. It works for several weeks and for up to 5-6 clothing washes. Do not apply permethrin directly to the skin. If your home windows do not have screens, think about putting some in. If you will be sleeping in an area where there are mosquitoes, consider covering your sleeping area with a mosquito net. Contact a doctor if: You have redness, swelling, or pain   in the bite area. You have fluid or blood coming from the bite area. The bite area feels warm to the touch. You have pus or a bad smell coming from the bite area. You have a fever. Get help right away if: You have joint pain. You have a rash. You feel weak or more tired than you normally do. You have neck pain or a headache. You have signs of an anaphylactic  reaction. Signs may include: Swelling of your eyes, lips, face, mouth, tongue, or throat. Feeling warm in the face. Itchy, red, swollen areas of skin. Trouble with breathing, talking, or swallowing. Wheezing. Feeling dizzy or light-headed. Fainting. Pain or cramps in your belly. Vomiting or watery poop. These symptoms may be an emergency. Get help right away. Call 911. Do not wait to see if symptoms will go away. Do not drive yourself to the hospital. Summary An insect bite can make your skin red, itchy, and swollen. Treatment is usually not needed. Symptoms often go away on their own. Do not scratch the bite area. Keep it clean and dry. Use insect repellent to help prevent insect bites. Contact a doctor if you have signs of infection. This information is not intended to replace advice given to you by your health care provider. Make sure you discuss any questions you have with your health care provider. Document Revised: 11/12/2021 Document Reviewed: 11/12/2021 Elsevier Patient Education  2024 [employer]  

## 2023-02-11 LAB — COMPLETE METABOLIC PANEL WITH GFR
AG Ratio: 1.6 (calc) (ref 1.0–2.5)
ALT: 11 U/L (ref 9–46)
AST: 15 U/L (ref 10–35)
Albumin: 4.3 g/dL (ref 3.6–5.1)
Alkaline phosphatase (APISO): 70 U/L (ref 35–144)
BUN: 20 mg/dL (ref 7–25)
CO2: 37 mmol/L — ABNORMAL HIGH (ref 20–32)
Calcium: 9.9 mg/dL (ref 8.6–10.3)
Chloride: 96 mmol/L — ABNORMAL LOW (ref 98–110)
Creat: 0.92 mg/dL (ref 0.70–1.28)
Globulin: 2.7 g/dL (calc) (ref 1.9–3.7)
Glucose, Bld: 104 mg/dL — ABNORMAL HIGH (ref 65–99)
Potassium: 5 mmol/L (ref 3.5–5.3)
Sodium: 138 mmol/L (ref 135–146)
Total Bilirubin: 0.4 mg/dL (ref 0.2–1.2)
Total Protein: 7 g/dL (ref 6.1–8.1)
eGFR: 89 mL/min/{1.73_m2} (ref >=60)

## 2023-02-11 LAB — CBC WITH DIFFERENTIAL/PLATELET
Absolute Monocytes: 688 cells/uL (ref 200–950)
Eosinophils Relative: 0.5 %
Hemoglobin: 15.2 g/dL (ref 13.2–17.1)
MCH: 31.3 pg (ref 27.0–33.0)
MCHC: 33.7 g/dL (ref 32.0–36.0)
MCV: 93 fL (ref 80.0–100.0)
Monocytes Relative: 9.3 %
Neutrophils Relative %: 73.6 %
Platelets: 215 10*3/uL (ref 140–400)
RBC: 4.85 10*6/uL (ref 4.20–5.80)
RDW: 11.6 % (ref 11.0–15.0)
WBC: 7.4 10*3/uL (ref 3.8–10.8)

## 2023-02-11 LAB — LIPID PANEL
Cholesterol: 154 mg/dL (ref <200)
HDL: 62 mg/dL (ref >=40)
LDL Cholesterol (Calc): 76 mg/dL (calc)
Non-HDL Cholesterol (Calc): 92 mg/dL (calc) (ref <130)
Total CHOL/HDL Ratio: 2.5 (calc) (ref <5.0)
Triglycerides: 84 mg/dL (ref <150)

## 2023-02-11 LAB — MAGNESIUM: Magnesium: 2 mg/dL (ref 1.5–2.5)

## 2023-02-27 ENCOUNTER — Other Ambulatory Visit: Payer: Self-pay | Admitting: Internal Medicine

## 2023-02-27 ENCOUNTER — Encounter: Payer: Self-pay | Admitting: Internal Medicine

## 2023-02-27 DIAGNOSIS — J449 Chronic obstructive pulmonary disease, unspecified: Secondary | ICD-10-CM

## 2023-02-27 MED ORDER — ALBUTEROL SULFATE HFA 108 (90 BASE) MCG/ACT IN AERS
INHALATION_SPRAY | RESPIRATORY_TRACT | 3 refills | Status: DC
Start: 2023-02-27 — End: 2023-10-09

## 2023-03-23 ENCOUNTER — Encounter: Payer: Self-pay | Admitting: Internal Medicine

## 2023-03-23 ENCOUNTER — Other Ambulatory Visit: Payer: Self-pay | Admitting: Internal Medicine

## 2023-03-23 MED ORDER — DEXAMETHASONE 4 MG PO TABS
ORAL_TABLET | ORAL | 0 refills | Status: DC
Start: 2023-03-23 — End: 2023-06-19

## 2023-04-04 ENCOUNTER — Other Ambulatory Visit: Payer: Self-pay | Admitting: Internal Medicine

## 2023-04-15 ENCOUNTER — Telehealth: Payer: Self-pay | Admitting: Pulmonary Disease

## 2023-04-15 NOTE — Telephone Encounter (Signed)
Called and spoke to pt. MyChart video visit has been scheduled with Dr. Tonia Brooms for 8/15 at 2:45. Pt verbalized understanding and denied any further questions or concerns at this time.

## 2023-04-15 NOTE — Telephone Encounter (Signed)
Please advise on patient's request to see [facility].   Thank you!

## 2023-04-15 NOTE — Telephone Encounter (Signed)
PT struggling w/his COPD. Dr. Catalina Pizza him to call us if he had issues. No avail appts w/I[facility], NP's but PT insisting on Icard anyway. States he can try to get in w/his primary care but would like to know if [facility] can fit him in, even a video visit.  Please call him either way so he knows if he needs to see his PCP or not. He will wait for call. [phone]

## 2023-04-16 ENCOUNTER — Telehealth (INDEPENDENT_AMBULATORY_CARE_PROVIDER_SITE_OTHER): Payer: Medicare Other | Admitting: Pulmonary Disease

## 2023-04-16 DIAGNOSIS — J9611 Chronic respiratory failure with hypoxia: Secondary | ICD-10-CM

## 2023-04-16 DIAGNOSIS — J4489 Other specified chronic obstructive pulmonary disease: Secondary | ICD-10-CM | POA: Diagnosis not present

## 2023-04-16 DIAGNOSIS — J449 Chronic obstructive pulmonary disease, unspecified: Secondary | ICD-10-CM

## 2023-04-16 MED ORDER — DOXYCYCLINE HYCLATE 100 MG PO TABS: 100.0000 mg | ORAL_TABLET | Freq: Two times a day (BID) | ORAL | 0 refills | Status: DC

## 2023-04-16 MED ORDER — PREDNISONE 5 MG PO TABS: 5.0000 mg | ORAL_TABLET | Freq: Every day | ORAL | 1 refills | Status: DC

## 2023-04-16 NOTE — Progress Notes (Addendum)
Virtual Visit via Video Note  I connected with Douglas Barrera on 04/16/23 at  2:45 PM EDT by telephone and verified that I am speaking with the correct person using two identifiers.  Location: Patient: Home  Provider: Office   I discussed the limitations, risks, security and privacy concerns of performing an evaluation and management service by video and the availability of in person appointments. I also discussed with the patient that there may be a patient responsible charge related to this service. The patient expressed understanding and agreed to proceed.  Unfortunately we were unable to connect his video side.  We were able to connect audio.   History of Present Illness:  This is a 72 year old gentleman known well to me currently on triple therapy inhaler regimen with Pulmicort Yupelri Perforomist.  Undergoing annual low-dose lung cancer screening CT.  Has a appointment a couple weeks ago with his primary care provider for COPD exacerbation was treated with antibiotics and dexamethasone.  He started to improve however about 5 days after stopping the prednisone he had a significant decline again.  Over the past week has had increased fatigue shortness of breath.  Difficulty getting him off the couch and walking across the room.  This is very different from his normal baseline.  He does have advanced disease with an FEV1 on last PFTs of 22%.  He is oxygen dependent.  Otherwise been thankful that he has not had any recent hospitalizations.  He does not have any more antibiotics rescue packs at home at this time.  Past Medical History:  Diagnosis Date   COPD (chronic obstructive pulmonary disease) (HCC)    GERD (gastroesophageal reflux disease)    History of elevated lipids    Hyperlipidemia    IBS (irritable bowel syndrome)    Labile hypertension    patient is not on meds for hypertension   Prediabetes    Secondary polycythemia 05/25/2018   Tubular adenoma of colon    Vitamin D deficiency        Observations/Objective:  Elderly gentleman resting comfortably.  Labored breath at times with long sentences  Assessment and Plan:  Severe COPD Chronic hypoxemic respiratory failure Worsening dyspnea on exertion denies sputum production or fevers.  Plan: Continue triple therapy nebulizer regimen Continue annual low-dose lung cancer screening CT Continue oxygen supplementation goal to maintain sats above 88%. He is on continue to monitor his O2 sats if these continue to fall with exertion he will need to seek medical care or consider ED evaluation. I am going to start him on 5 mg prednisone daily. We talked about the pros cons risk benefits and alternatives to consideration of chronic low-dose prednisone. We does feel that it is needed at this time. We talked about the severity of his illness at baseline before. He would like to go ahead and give this a try to see if this is going to help him stay out of the hospital and have more energy. Return to clinic to see [place] in approximately 8 to 12 weeks.   Follow Up Instructions:    I discussed the assessment and treatment plan with the patient. The patient was provided an opportunity to ask questions and all were answered. The patient agreed with the plan and demonstrated an understanding of the instructions.   The patient was advised to call back or seek an in-person evaluation if the symptoms worsen or if the condition fails to improve as anticipated.  15 mins time spent   [employer]  L Forrestine Lecrone, DO

## 2023-04-27 ENCOUNTER — Ambulatory Visit (INDEPENDENT_AMBULATORY_CARE_PROVIDER_SITE_OTHER): Payer: Medicare Other

## 2023-04-27 DIAGNOSIS — Z87891 Personal history of nicotine dependence: Secondary | ICD-10-CM

## 2023-04-27 DIAGNOSIS — Z122 Encounter for screening for malignant neoplasm of respiratory organs: Secondary | ICD-10-CM | POA: Diagnosis not present

## 2023-04-27 DIAGNOSIS — F17210 Nicotine dependence, cigarettes, uncomplicated: Secondary | ICD-10-CM

## 2023-04-30 DIAGNOSIS — H25043 Posterior subcapsular polar age-related cataract, bilateral: Secondary | ICD-10-CM | POA: Diagnosis not present

## 2023-04-30 DIAGNOSIS — H2511 Age-related nuclear cataract, right eye: Secondary | ICD-10-CM | POA: Diagnosis not present

## 2023-04-30 DIAGNOSIS — H353132 Nonexudative age-related macular degeneration, bilateral, intermediate dry stage: Secondary | ICD-10-CM | POA: Diagnosis not present

## 2023-04-30 DIAGNOSIS — H18413 Arcus senilis, bilateral: Secondary | ICD-10-CM | POA: Diagnosis not present

## 2023-04-30 DIAGNOSIS — H2513 Age-related nuclear cataract, bilateral: Secondary | ICD-10-CM | POA: Diagnosis not present

## 2023-05-04 ENCOUNTER — Encounter: Payer: Self-pay | Admitting: Pulmonary Disease

## 2023-05-05 ENCOUNTER — Other Ambulatory Visit: Payer: Self-pay | Admitting: Acute Care

## 2023-05-05 DIAGNOSIS — Z87891 Personal history of nicotine dependence: Secondary | ICD-10-CM

## 2023-05-05 DIAGNOSIS — F17210 Nicotine dependence, cigarettes, uncomplicated: Secondary | ICD-10-CM

## 2023-05-05 DIAGNOSIS — Z122 Encounter for screening for malignant neoplasm of respiratory organs: Secondary | ICD-10-CM

## 2023-05-06 ENCOUNTER — Other Ambulatory Visit: Payer: Self-pay | Admitting: Nurse Practitioner

## 2023-05-06 DIAGNOSIS — G4700 Insomnia, unspecified: Secondary | ICD-10-CM

## 2023-06-16 DIAGNOSIS — Z23 Encounter for immunization: Secondary | ICD-10-CM | POA: Diagnosis not present

## 2023-06-18 NOTE — Progress Notes (Signed)
MEDICARE ANNUAL WELLNESS VISIT AND FOLLOW UP Assessment:   Douglas Barrera was seen today for follow-up and medicare wellness.  Diagnoses and all orders for this visit:  Annual Medicare Wellness Visit Due annually  Health maintenance reviewed  Labile hypertension Controlled off of medications Monitor blood pressure at home; call if consistently over 130/80 Continue DASH diet.   Reminder to go to the ER if any CP, SOB, nausea, dizziness, severe HA, changes vision/speech, left arm numbness and tingling and jaw pain.  Coronary atherosclerosis due to calcified coronary lesion Per CT, has seen Dr. Allyson Sabal but declined further workup Continue ASA, discussed LDL goal <70 Denies angina sx Control blood pressure, cholesterol, glucose, increase exercise.   Aortic atherosclerosis (HCC) Per numerous CTs Control blood pressure, cholesterol, glucose, increase exercise.   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; continue neb/inhalers; O2; counseled to stop smoking  Chronic hypoxemic respiratory failure (HCC) On O2 3 L continuous; monitor   Irritable bowel syndrome, unspecified type Add soluble fiber; monitor; recently fairly managed  Gastroesophageal reflux disease, esophagitis presence not specified Well managed on current medications Discussed diet, avoiding triggers and other lifestyle changes  Vitamin D deficiency At goal at recent check; continue to recommend supplementation for goal of 60-100 Defer vitamin D level  Smoker Rare, encouraged cessation; neg CT screening 12/2020  Other abnormal glucose (prediabetes) Discussed disease and risks Discussed diet/exercise, weight management    Medication management - CBC - CMP - TSH  Hyperlipidemia CAD by imaging; LDL goal <70, titrate statin as needed Continue low cholesterol diet and exercise.  - lipid panel.   History of adenomatous polyp of colon UTD on colonoscopy; due 2025       Over 30 minutes of exam,  counseling, chart review, and critical decision making was performed  Future Appointments  Date Time Provider Department Center  09/21/2023  9:30 AM Lucky Cowboy, MD [facility] None  12/21/2023 10:00 AM Lucky Cowboy, MD [facility] None  06/20/2024 10:00 AM Raynelle Dick, NP [facility] None     Plan:   During the course of the visit the patient was educated and counseled about appropriate screening and preventive services including:   Pneumococcal vaccine  Influenza vaccine Prevnar 13 Td vaccine Screening electrocardiogram Colorectal cancer screening Diabetes screening Glaucoma screening Nutrition counseling    Subjective:  Douglas Barrera is a 72 y.o. male who presents for Medicare Annual Wellness Visit and 3 month follow up for HTN, hyperlipidemia, prediabetes, and vitamin D Def.    Patient has severe COPD consequent of years of smoking, controlled with MDI  & nebulized meds (perforomist) and guaifenesin. He is followed by Dr Wendall Mola (Pulmonary), on continuous Oxygen therapy.  He moitors O2 sats and regulate his O2 flow rate  3 l/min to keep his O2 sats in the low 90's range. No longer sneaking a cigar. He is on neb med.  Ct lung in 04/23/22 1. Lung-RADS 2, benign appearance or behavior. Continue annual screening with low-dose chest CT without contrast in 12 months. 2. 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 3. Three-vessel coronary atherosclerosis. 4. Aortic Atherosclerosis (ICD10-I70.0) and Emphysema (ICD10-J43.9).  Trazodone 150 mg daily at night for sleep and endorses restorative sleep.   BMI is Body mass index is 24.55 kg/m., he has been working on diet, pushes fresh vegetables and fruits, avoids processed foods, cooks at home most of  the time.  Walks 15-20, 3-4 days a week. He  was wearing coat and sweatshirt today compared to summer clothes Wt Readings from Last 3 Encounters:  06/19/23 143 lb (64.9 kg)  04/27/23 138 lb (62.6 kg)  02/10/23 138 lb 9.6 oz (62.9 kg)   Previous CTs have shown 3 vessel CAD. He was evaluated by Dr. Allyson Sabal in 2020 for this who had planned coronary calcium study but was having problems with insurance, then deferred due to pandemic. Denies sx, declines referral back.   His blood pressure has been controlled at home, today their BP is BP: 120/76  BP Readings from Last 3 Encounters:  06/19/23 120/76  02/10/23 112/68  01/07/23 100/60  He does workout. He denies chest pain, shortness of breath (none if he stays on O2) dizziness.   He is on cholesterol medication (rosuvastatin MWFSat) and denies myalgias. His cholesterol is not at goal LDL <70. The cholesterol last visit was:   Lab Results  Component Value Date   CHOL 154 02/10/2023   HDL 62 02/10/2023   LDLCALC 76 02/10/2023   TRIG 84 02/10/2023   CHOLHDL 2.5 02/10/2023   He has not been working on diet and exercise for prediabetes, and denies increased appetite, nausea, paresthesia of the feet, polydipsia, polyuria and visual disturbances. Last A1C in the office was:  Lab Results  Component Value Date   HGBA1C 6.2 (H) 10/30/2022   He is drinking lots of water. Last GFR Lab Results  Component Value Date   EGFR 89 02/10/2023     Patient is on Vitamin D supplement and at goal at last check:    Lab Results  Component Value Date   VD25OH 85 10/30/2022      Medication Review:  Current Outpatient Medications (Endocrine & Metabolic):    predniSONE (DELTASONE) 5 MG tablet, Take 1 tablet (5 mg total) by mouth daily with breakfast.   dexamethasone (DECADRON) 4 MG tablet, Take 1 tab 3 x day - 3 days, then 2 x day - 3 days, then 1 tab daily  Current Outpatient Medications (Cardiovascular):    rosuvastatin (CRESTOR) 5 MG tablet, Take 1 tab   4 x / week (MWF&Sat)  for  Cholesterol.  Current Outpatient Medications (Respiratory):    albuterol (VENTOLIN HFA) 108 (90 Base) MCG/ACT inhaler, Inhale  1 to 2 puffs (15 minutes apart)     4 x day or every 4 hours to rescue asthma   budesonide (PULMICORT) 0.5 MG/2ML nebulizer solution, Take 2 mLs (0.5 mg total) by nebulization 2 (two) times daily.   diphenhydrAMINE (BENADRYL) 25 MG tablet, Take 50 mg by mouth daily.    fluticasone (FLONASE) 50 MCG/ACT nasal spray, Place 2 sprays into both nostrils daily. (Patient taking differently: Place 2 sprays into both nostrils every evening.)   formoterol (PERFOROMIST) 20 MCG/2ML nebulizer solution, USE 1 VIAL VIA NEBULIER TWO TIMES A DAY   revefenacin (YUPELRI) 175 MCG/3ML nebulizer solution, Take 3 mLs (175 mcg total) by nebulization daily.  Current Outpatient Medications (Analgesics):    aspirin 81 MG tablet, Take 81 mg by mouth every evening.    Current Outpatient Medications (Other):    Cholecalciferol (VITAMIN D3) 5000 units CAPS, Take 10,000 Units by mouth daily.    doxycycline (VIBRA-TABS) 100 MG tablet, Take 1 tablet (100 mg total) by mouth 2 (two) times daily.   doxycycline (VIBRAMYCIN) 100 MG capsule, Take 1 capsule twice daily with food   traZODone (DESYREL) 150 MG tablet, TAKE 1 TABLET BY MOUTH  1 HOUR BEFORE BEDTIME AS NEEDED FOR SLEEP   trolamine salicylate (ASPERCREME) 10 % cream, Apply 1 application topically as needed for muscle pain.   zinc gluconate 50 MG tablet, Take 50 mg by mouth daily.  Allergies: Allergies  Allergen Reactions   Niacin And Related Other (See Comments)    Unknown Childhood reaction.    Current Problems (verified) has 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HCC); Labile hypertension; Hyperlipidemia; GERD (gastroesophageal reflux disease); Other abnormal glucose (prediabetes); IBS (irritable bowel syndrome); Vitamin D deficiency; Medication management; Smoker; Aortic atherosclerosis (HCC) by Chest CTscan on 01/09/2021; Coronary atherosclerosis by Chest CT  scan on 11/01/2019; Chronic hypoxemic respiratory failure (HCC); History of adenomatous polyp of colon; Right upper lobe pulmonary nodule; Skin lesion of right leg; and Skin lesion of left arm on their problem list.  Screening Tests Immunization History  Administered Date(s) Administered   Fluad Quad(high Dose 65+) 06/13/2020   Influenza Split 06/20/2013, 06/20/2014, 06/28/2015   Influenza, High Dose Seasonal PF 06/17/2017, 05/24/2018, 06/14/2018, 06/29/2019, 06/18/2022, 06/15/2023   Influenza,inj,quad, With Preservative 07/21/2016   Influenza-Unspecified 06/17/2017   PFIZER Comirnaty(Gray Top)Covid-19 Tri-Sucrose Vaccine 06/15/2023   PFIZER(Purple Top)SARS-COV-2 Vaccination 09/26/2019, 10/17/2019, 06/19/2020   PPD Test 06/20/2014, 06/28/2015, 07/21/2016   Pfizer(Comirnaty)Fall Seasonal Vaccine 12 years and older 08/13/2022   Pneumococcal Conjugate-13 06/20/2014   Pneumococcal Polysaccharide-23 04/30/2009, 08/12/2017   Respiratory Syncytial Virus Vaccine,Recomb Aduvanted(Arexvy) 08/13/2022   Tdap 09/01/2009   Zoster Recombinant(Shingrix) 01/14/2022, 03/20/2022   Health Maintenance  Topic Date Due   DTaP/Tdap/Td (2 - Td or Tdap) 06/18/2024 (Originally 09/02/2019)   COVID-19 Vaccine (6 - 2023-24 season) 08/10/2023   Colonoscopy  10/27/2023   Lung Cancer Screening  04/26/2024   Medicare Annual Wellness (AWV)  06/18/2024   Pneumonia Vaccine 60+ Years old  Completed   INFLUENZA VACCINE  Completed   Hepatitis C Screening  Completed   HPV VACCINES  Aged Out   Zoster Vaccines- Shingrix  Discontinued     Names of Other Physician/Practitioners you currently use: 1. [facility] here for primary care 2. [facility], Dr. Lawerance Bach, eye doctor, last visit 04/2023 3.  dentist, last visit remotely, has full dentures  Patient Care Team: Lucky Cowboy, MD as PCP - General (Internal Medicine) Meryl Dare, MD as Consulting Physician  (Gastroenterology) Josephine Igo, DO as Consulting Physician (Pulmonary Disease)  Surgical: He  has a past surgical history that includes Testicle surgery; Excision basal cell carcinoma (2008); and Colonoscopy with propofol (N/A, 10/26/2018). Family His family history includes Breast cancer in his mother; Stomach cancer in his mother. Social history  He reports that he quit smoking about 4 years ago. His smoking use included cigars and cigarettes. He started smoking about 54 years ago. He has a 99.7 pack-year smoking history. He has never used smokeless tobacco. He reports that he does not drink alcohol and does not use drugs.  MEDICARE WELLNESS OBJECTIVES: Physical activity:  sedentary Cardiac risk factors: Cardiac Risk Factors include: advanced age (>6men, >56 women);dyslipidemia;smoking/ tobacco exposure;sedentary lifestyle;male gender Depression/mood screen:      06/19/2023   10:07 AM  Depression screen PHQ 2/9  Decreased Interest 0  Down, Depressed, Hopeless 0  PHQ - 2 Score 0    ADLs:     06/19/2023   10:05 AM 11/02/2022   12:49 AM  In your present state of health, do you have any difficulty performing the following activities:  Hearing? 0 0  Vision? 0 0  Difficulty concentrating or making decisions? 0  0  Walking or climbing stairs? 0 0  Dressing or bathing? 0 0  Doing errands, shopping? 0 0     Cognitive Testing  Alert? Yes  Normal Appearance?Yes  Oriented to person? Yes  Place? Yes   Time? Yes  Recall of three objects?  Yes  Can perform simple calculations? Yes  Displays appropriate judgment?Yes  Can read the correct time from a watch face?Yes  EOL planning: Does Patient Have a Medical Advance Directive?: No Would patient like information on creating a medical advance directive?: No - Patient declined   Objective:   Today's Vitals   06/19/23 0953  BP: 120/76  Pulse: 76  Resp: 18  Temp: 97.9 F (36.6 C)  SpO2: 94%  Weight: 143 lb (64.9 kg)  Height:  5\' 4"  (1.626 m)      Body mass index is 24.55 kg/m.  General Appearance:  Pleasant thin male with O2 [place] in no apparent distress. Eyes: PERRLA, EOMs, conjunctiva no swelling or erythema Sinuses: No Frontal/maxillary tenderness ENT/Mouth: Ext aud canals clear, TMs without erythema, bulging. No erythema, swelling, or exudate on post pharynx. Hearing normal.  Neck: Supple, thyroid normal.  Respiratory: Respiratory effort normal, Decreased breath sounds throughout without rales, rhonchi, wheezing or stridor. Patient on [place] with 3 L via [place]. Cardio: RRR with no MRGs, distant heart sounds. Brisk peripheral pulses with mild edema bilaterally Abdomen: Soft, + BS.   Lymphatics: Non tender without lymphadenopathy.  Musculoskeletal: Full ROM, 4/5 strength diffuse decreased muscle tone, Normal gait Skin: Warm, dry without rashes, lesions, ecchymosis.  Neuro: Cranial nerves intact. No cerebellar symptoms.  Psych: Awake and oriented X 3, normal affect, Insight and Judgment appropriate.  Skin: warm/dry; intact; fragile; scattered small ecchymoses to upper extremities   Medicare Attestation I have personally reviewed: The patient's medical and social history Their use of alcohol, tobacco or illicit drugs Their current medications and supplements The patient's functional ability including ADLs,fall risks, home safety risks, cognitive, and hearing and visual impairment Diet and physical activities Evidence for depression or mood disorders  The patient's weight, height, BMI, and visual acuity have been recorded in the chart.  I have made referrals, counseling, and provided education to the patient based on review of the above and I have provided the patient with a written personalized care plan for preventive services.     Raynelle Dick, NP   06/19/2023

## 2023-06-19 ENCOUNTER — Encounter: Payer: Self-pay | Admitting: Nurse Practitioner

## 2023-06-19 ENCOUNTER — Ambulatory Visit (INDEPENDENT_AMBULATORY_CARE_PROVIDER_SITE_OTHER): Payer: Medicare Other | Admitting: Nurse Practitioner

## 2023-06-19 VITALS — BP 120/76 | HR 76 | Temp 97.9°F | Resp 18 | Ht 64.0 in | Wt 143.0 lb

## 2023-06-19 DIAGNOSIS — J301 Allergic rhinitis due to pollen: Secondary | ICD-10-CM | POA: Diagnosis not present

## 2023-06-19 DIAGNOSIS — R6889 Other general symptoms and signs: Secondary | ICD-10-CM

## 2023-06-19 DIAGNOSIS — Z79899 Other long term (current) drug therapy: Secondary | ICD-10-CM | POA: Diagnosis not present

## 2023-06-19 DIAGNOSIS — K219 Gastro-esophageal reflux disease without esophagitis: Secondary | ICD-10-CM

## 2023-06-19 DIAGNOSIS — I2584 Coronary atherosclerosis due to calcified coronary lesion: Secondary | ICD-10-CM

## 2023-06-19 DIAGNOSIS — R7309 Other abnormal glucose: Secondary | ICD-10-CM | POA: Diagnosis not present

## 2023-06-19 DIAGNOSIS — J9611 Chronic respiratory failure with hypoxia: Secondary | ICD-10-CM | POA: Diagnosis not present

## 2023-06-19 DIAGNOSIS — Z0001 Encounter for general adult medical examination with abnormal findings: Secondary | ICD-10-CM | POA: Diagnosis not present

## 2023-06-19 DIAGNOSIS — I700 Atherosclerosis of aorta: Secondary | ICD-10-CM

## 2023-06-19 DIAGNOSIS — I10 Essential (primary) hypertension: Secondary | ICD-10-CM | POA: Diagnosis not present

## 2023-06-19 DIAGNOSIS — E559 Vitamin D deficiency, unspecified: Secondary | ICD-10-CM

## 2023-06-19 DIAGNOSIS — F17200 Nicotine dependence, unspecified, uncomplicated: Secondary | ICD-10-CM

## 2023-06-19 DIAGNOSIS — J449 Chronic obstructive pulmonary disease, unspecified: Secondary | ICD-10-CM

## 2023-06-19 DIAGNOSIS — K589 Irritable bowel syndrome without diarrhea: Secondary | ICD-10-CM | POA: Diagnosis not present

## 2023-06-19 DIAGNOSIS — Z23 Encounter for immunization: Secondary | ICD-10-CM

## 2023-06-19 DIAGNOSIS — Z860101 Personal history of adenomatous and serrated colon polyps: Secondary | ICD-10-CM | POA: Diagnosis not present

## 2023-06-19 DIAGNOSIS — E782 Mixed hyperlipidemia: Secondary | ICD-10-CM | POA: Diagnosis not present

## 2023-06-19 DIAGNOSIS — I2510 Atherosclerotic heart disease of native coronary artery without angina pectoris: Secondary | ICD-10-CM | POA: Diagnosis not present

## 2023-06-19 NOTE — Patient Instructions (Signed)

## 2023-06-20 LAB — COMPLETE METABOLIC PANEL WITH GFR
AG Ratio: 1.6 (calc) (ref 1.0–2.5)
ALT: 17 U/L (ref 9–46)
AST: 17 U/L (ref 10–35)
Albumin: 4.2 g/dL (ref 3.6–5.1)
Alkaline phosphatase (APISO): 75 U/L (ref 35–144)
BUN: 19 mg/dL (ref 7–25)
CO2: 36 mmol/L — ABNORMAL HIGH (ref 20–32)
Calcium: 9.7 mg/dL (ref 8.6–10.3)
Chloride: 96 mmol/L — ABNORMAL LOW (ref 98–110)
Creat: 1.08 mg/dL (ref 0.70–1.28)
Globulin: 2.7 g/dL (ref 1.9–3.7)
Glucose, Bld: 93 mg/dL (ref 65–99)
Potassium: 4.9 mmol/L (ref 3.5–5.3)
Sodium: 136 mmol/L (ref 135–146)
Total Bilirubin: 0.4 mg/dL (ref 0.2–1.2)
Total Protein: 6.9 g/dL (ref 6.1–8.1)
eGFR: 73 mL/min/{1.73_m2} (ref >=60)

## 2023-06-20 LAB — CBC WITH DIFFERENTIAL/PLATELET
Absolute Lymphocytes: 1295 {cells}/uL (ref 850–3900)
Absolute Monocytes: 940 {cells}/uL (ref 200–950)
Basophils Absolute: 37 {cells}/uL (ref 0–200)
Basophils Relative: 0.5 %
Eosinophils Absolute: 141 {cells}/uL (ref 15–500)
Eosinophils Relative: 1.9 %
HCT: 41.9 % (ref 38.5–50.0)
Hemoglobin: 13.6 g/dL (ref 13.2–17.1)
MCH: 30.2 pg (ref 27.0–33.0)
MCHC: 32.5 g/dL (ref 32.0–36.0)
MCV: 92.9 fL (ref 80.0–100.0)
MPV: 10 fL (ref 7.5–12.5)
Monocytes Relative: 12.7 %
Neutro Abs: 4988 {cells}/uL (ref 1500–7800)
Neutrophils Relative %: 67.4 %
Platelets: 228 10*3/uL (ref 140–400)
RBC: 4.51 10*6/uL (ref 4.20–5.80)
RDW: 11.1 % (ref 11.0–15.0)
Total Lymphocyte: 17.5 %
WBC: 7.4 10*3/uL (ref 3.8–10.8)

## 2023-06-20 LAB — LIPID PANEL
Cholesterol: 166 mg/dL (ref <200)
HDL: 73 mg/dL (ref >=40)
LDL Cholesterol (Calc): 75 mg/dL
Non-HDL Cholesterol (Calc): 93 mg/dL (ref <130)
Total CHOL/HDL Ratio: 2.3 (calc) (ref <5.0)
Triglycerides: 97 mg/dL (ref <150)

## 2023-06-20 LAB — TSH: TSH: 1.31 m[IU]/L (ref 0.40–4.50)

## 2023-06-24 DIAGNOSIS — H2511 Age-related nuclear cataract, right eye: Secondary | ICD-10-CM | POA: Diagnosis not present

## 2023-06-25 DIAGNOSIS — H2512 Age-related nuclear cataract, left eye: Secondary | ICD-10-CM | POA: Diagnosis not present

## 2023-07-15 DIAGNOSIS — H2512 Age-related nuclear cataract, left eye: Secondary | ICD-10-CM | POA: Diagnosis not present

## 2023-08-19 ENCOUNTER — Encounter: Payer: Self-pay | Admitting: Internal Medicine

## 2023-08-19 NOTE — Progress Notes (Unsigned)
Assessment and Plan:  There are no diagnoses linked to this encounter.    Further disposition pending results of labs. Discussed med's effects and SE's.   Over 30 minutes of exam, counseling, chart review, and critical decision making was performed.   Future Appointments  Date Time Provider Department Center  08/20/2023 10:30 AM Raynelle Dick, NP [facility] None  09/21/2023  9:30 AM Lucky Cowboy, MD [facility] None  12/21/2023 10:00 AM Lucky Cowboy, MD [facility] None  06/20/2024 10:00 AM Raynelle Dick, NP [facility] None    ------------------------------------------------------------------------------------------------------------------   HPI There were no vitals taken for this visit. 72 y.o.male presents for  BP well controlled without medication BP Readings from Last 3 Encounters:  06/19/23 120/76  02/10/23 112/68  01/07/23 100/60  Denies headaches, chest pain, shortness of breath and dizziness   BMI is There is no height or weight on file to calculate BMI., he {HAS HAS [id number]} been working on diet and exercise. Wt Readings from Last 3 Encounters:  06/19/23 143 lb (64.9 kg)  04/27/23 138 lb (62.6 kg)  02/10/23 138 lb 9.6 oz (62.9 kg)     Past Medical History:  Diagnosis Date   COPD (chronic obstructive pulmonary disease) (HCC)    GERD (gastroesophageal reflux disease)    History of elevated lipids    Hyperlipidemia    IBS (irritable bowel syndrome)    Labile hypertension    patient is not on meds for hypertension   Prediabetes    Secondary polycythemia 05/25/2018   Tubular adenoma of colon    Vitamin D deficiency      Allergies  Allergen Reactions   Niacin And Related Other (See Comments)    Unknown Childhood reaction.    Current Outpatient Medications on File Prior to Visit  Medication Sig   albuterol (VENTOLIN HFA) 108 (90 Base) MCG/ACT inhaler Inhale  1 to 2 puffs (15 minutes apart)     4 x day or every 4 hours to rescue asthma    aspirin 81 MG tablet Take 81 mg by mouth every evening.    budesonide (PULMICORT) 0.5 MG/2ML nebulizer solution Take 2 mLs (0.5 mg total) by nebulization 2 (two) times daily.   Cholecalciferol (VITAMIN D3) 5000 units CAPS Take 10,000 Units by mouth daily.    diphenhydrAMINE (BENADRYL) 25 MG tablet Take 50 mg by mouth daily.    doxycycline (VIBRA-TABS) 100 MG tablet Take 1 tablet (100 mg total) by mouth 2 (two) times daily.   doxycycline (VIBRAMYCIN) 100 MG capsule Take 1 capsule twice daily with food   fluticasone (FLONASE) 50 MCG/ACT nasal spray Place 2 sprays into both nostrils daily. (Patient taking differently: Place 2 sprays into both nostrils every evening.)   formoterol (PERFOROMIST) 20 MCG/2ML nebulizer solution USE 1 VIAL VIA NEBULIER TWO TIMES A DAY   predniSONE (DELTASONE) 5 MG tablet Take 1 tablet (5 mg total) by mouth daily with breakfast.   revefenacin (YUPELRI) 175 MCG/3ML nebulizer solution Take 3 mLs (175 mcg total) by nebulization daily.   rosuvastatin (CRESTOR) 5 MG tablet Take 1 tab   4 x / week (MWF&Sat)  for Cholesterol.   traZODone (DESYREL) 150 MG tablet TAKE 1 TABLET BY MOUTH 1 HOUR BEFORE BEDTIME AS NEEDED FOR SLEEP   trolamine salicylate (ASPERCREME) 10 % cream Apply 1 application topically as needed for muscle pain.   zinc gluconate 50 MG tablet Take 50 mg by mouth daily.   No current facility-administered medications on file prior to visit.  ROS: all negative except above.   Physical Exam:  There were no vitals taken for this visit.  General Appearance: Well nourished, in no apparent distress. Eyes: PERRLA, EOMs, conjunctiva no swelling or erythema Sinuses: No Frontal/maxillary tenderness ENT/Mouth: Ext aud canals clear, TMs without erythema, bulging. No erythema, swelling, or exudate on post pharynx.  Tonsils not swollen or erythematous. Hearing normal.  Neck: Supple, thyroid normal.  Respiratory: Respiratory effort normal, BS equal bilaterally without  rales, rhonchi, wheezing or stridor.  Cardio: RRR with no MRGs. Brisk peripheral pulses without edema.  Abdomen: Soft, + BS.  Non tender, no guarding, rebound, hernias, masses. Lymphatics: Non tender without lymphadenopathy.  Musculoskeletal: Full ROM, 5/5 strength, normal gait.  Skin: Warm, dry without rashes, lesions, ecchymosis.  Neuro: Cranial nerves intact. Normal muscle tone, no cerebellar symptoms. Sensation intact.  Psych: Awake and oriented X 3, normal affect, Insight and Judgment appropriate.     Raynelle Dick, NP 2:52 PM [facility]

## 2023-08-20 ENCOUNTER — Encounter: Payer: Self-pay | Admitting: Nurse Practitioner

## 2023-08-20 ENCOUNTER — Ambulatory Visit: Payer: Medicare Other

## 2023-08-20 ENCOUNTER — Encounter: Payer: Self-pay | Admitting: Internal Medicine

## 2023-08-20 ENCOUNTER — Ambulatory Visit (INDEPENDENT_AMBULATORY_CARE_PROVIDER_SITE_OTHER): Payer: Medicare Other | Admitting: Nurse Practitioner

## 2023-08-20 VITALS — BP 104/60 | HR 98 | Temp 97.9°F | Ht 64.0 in | Wt 149.0 lb

## 2023-08-20 DIAGNOSIS — K565 Intestinal adhesions [bands], unspecified as to partial versus complete obstruction: Secondary | ICD-10-CM | POA: Diagnosis not present

## 2023-08-20 DIAGNOSIS — Z9981 Dependence on supplemental oxygen: Secondary | ICD-10-CM | POA: Diagnosis not present

## 2023-08-20 DIAGNOSIS — I10 Essential (primary) hypertension: Secondary | ICD-10-CM | POA: Diagnosis not present

## 2023-08-20 DIAGNOSIS — K3580 Unspecified acute appendicitis: Secondary | ICD-10-CM

## 2023-08-20 DIAGNOSIS — R1031 Right lower quadrant pain: Secondary | ICD-10-CM | POA: Diagnosis not present

## 2023-08-20 DIAGNOSIS — K5730 Diverticulosis of large intestine without perforation or abscess without bleeding: Secondary | ICD-10-CM | POA: Diagnosis not present

## 2023-08-20 DIAGNOSIS — E785 Hyperlipidemia, unspecified: Secondary | ICD-10-CM | POA: Diagnosis not present

## 2023-08-20 DIAGNOSIS — F17210 Nicotine dependence, cigarettes, uncomplicated: Secondary | ICD-10-CM | POA: Diagnosis not present

## 2023-08-20 DIAGNOSIS — K4090 Unilateral inguinal hernia, without obstruction or gangrene, not specified as recurrent: Secondary | ICD-10-CM | POA: Diagnosis not present

## 2023-08-20 DIAGNOSIS — J449 Chronic obstructive pulmonary disease, unspecified: Secondary | ICD-10-CM | POA: Diagnosis not present

## 2023-08-20 NOTE — Patient Instructions (Signed)
Will get stat abdominal CT and treat pending results If pain worsens, run a fever go to ER for evaluation Abdominal Pain, Adult  Pain in the abdomen (abdominal pain) can be caused by many things. In most cases, it gets better with no treatment or by being treated at home. But in some cases, it can be serious. Your health care provider will ask questions about your medical history and do a physical exam to try to figure out what is causing your pain. Follow these instructions at home: Medicines Take over-the-counter and prescription medicines only as told by your provider. Do not take medicines that help you poop (laxatives) unless told by your provider. General instructions Watch your condition for any changes. Drink enough fluid to keep your pee (urine) pale yellow. Contact a health care provider if: Your pain changes, gets worse, or lasts longer than expected. You have severe cramping or bloating in your abdomen, or you vomit. Your pain gets worse with meals, after eating, or with certain foods. You are constipated or have diarrhea for more than 2-3 days. You are not hungry, or you lose weight without trying. You have signs of dehydration. These may include: Dark pee, very little pee, or no pee. Cracked lips or dry mouth. Sleepiness or weakness. You have pain when you pee (urinate) or poop. Your abdominal pain wakes you up at night. You have blood in your pee. You have a fever. Get help right away if: You cannot stop vomiting. Your pain is only in one part of the abdomen. Pain on the right side could be caused by appendicitis. You have bloody or black poop (stool), or poop that looks like tar. You have trouble breathing. You have chest pain. These symptoms may be an emergency. Get help right away. Call 911. Do not wait to see if the symptoms will go away. Do not drive yourself to the hospital. This information is not intended to replace advice given to you by your health care  provider. Make sure you discuss any questions you have with your health care provider. Document Revised: 06/04/2022 Document Reviewed: 06/04/2022 Elsevier Patient Education  2024 [employer].

## 2023-08-21 DIAGNOSIS — K35201 Acute appendicitis with generalized peritonitis, with perforation, without abscess: Secondary | ICD-10-CM | POA: Diagnosis not present

## 2023-08-21 DIAGNOSIS — K3580 Unspecified acute appendicitis: Secondary | ICD-10-CM | POA: Diagnosis not present

## 2023-08-21 DIAGNOSIS — K660 Peritoneal adhesions (postprocedural) (postinfection): Secondary | ICD-10-CM | POA: Diagnosis not present

## 2023-08-21 DIAGNOSIS — J449 Chronic obstructive pulmonary disease, unspecified: Secondary | ICD-10-CM | POA: Diagnosis not present

## 2023-08-22 ENCOUNTER — Encounter: Payer: Self-pay | Admitting: Internal Medicine

## 2023-08-24 DIAGNOSIS — K565 Intestinal adhesions [bands], unspecified as to partial versus complete obstruction: Secondary | ICD-10-CM | POA: Diagnosis not present

## 2023-08-24 DIAGNOSIS — K3580 Unspecified acute appendicitis: Secondary | ICD-10-CM | POA: Diagnosis not present

## 2023-08-24 DIAGNOSIS — J449 Chronic obstructive pulmonary disease, unspecified: Secondary | ICD-10-CM | POA: Diagnosis not present

## 2023-08-24 DIAGNOSIS — E785 Hyperlipidemia, unspecified: Secondary | ICD-10-CM | POA: Diagnosis not present

## 2023-09-03 ENCOUNTER — Inpatient Hospital Stay: Payer: Medicare Other | Admitting: Nurse Practitioner

## 2023-09-03 NOTE — Progress Notes (Signed)
 [place]      ADULT   &   ADOLESCENT      INTERNAL MEDICINE  Elsie Richards, M.D.          Lonell Rous, ANP        Bascom Necessary, FNP  [facility] [address]  [place], [place]. [address] Telephone [phone] Telefax [phone]  Future Appointments  Date Time Provider Department  09/04/2023                hosp f/u  9:30 AM Richards Elsie, MD [facility]  09/21/2023                9 mo ov   9:30 AM Richards Elsie, MD [facility]  12/21/2023                cpe 10:00 AM Richards Elsie, MD [facility]  06/20/2024                wellness 10:00 AM Wilkinson, Dana E, NP [facility]    Post- Hospital Follow-Up     This very nice 73 y.o. MWM  was admitted to the hospital on  08/20/23  and patient was discharged from the hospital on   12/21  . The patient now presents for 13  day follow up for transition from recent hospitalization .  The day after discharge  our clinical staff contacted the patient to assure stability and schedule a follow up appointment. The discharge summary, medications and diagnostic test results were reviewed before meeting with the patient. The patient was admitted  to [facility] for acute Appendicitis and had emergent Laparoscopic Appendectomy on 08/21/23 and was discharged home the following day on 08/22/2023.  Since returning home from hospital, patient has been gradually increasing activity to previous baseline activity and also to avoid any heavy lifting, straining or strenuous activity. Oral intake has been adequate And BMs have been regular.                Dx -     Acute appendicitis with generalized peritonitis without gangrene, perforation, or abscess    Hospitalization discharge instructions and medications are reconciled with the patient.      Patient is also followed with Hypertension, Hyperlipidemia, Pre-Diabetes, Vitamin D  Deficiency & O2 Dependent severe COPD.       Patient is treated for HTN   (1997) & BP has been controlled at home. Today's  . Patient has had no complaints of any cardiac type chest pain, palpitations, dyspnea/orthopnea/PND, dizziness, claudication, or dependent edema.      Hyperlipidemia is controlled with diet & Rosuvastatin  .  Patient denies myalgias or other med SE's. Last Lipids were at goal :  Lab Results  Component Value Date   CHOL 166 06/19/2023   HDL 73 06/19/2023   LDLCALC 75 06/19/2023   TRIG 97 06/19/2023   CHOLHDL 2.3 06/19/2023       Also, the patient has history of  PreDiabetes (A1c 6.0% /2010) and has had no symptoms of reactive hypoglycemia, diabetic polys, paresthesias or visual blurring.  Last A1c was not at goal :  Lab Results  Component Value Date   HGBA1C 6.2 (H) 10/30/2022       Further, the patient also has history of Vitamin D  Deficiency (24 /2008)  and supplements vitamin D  without any suspected side-effects. Last vitamin D  was  at goal :  Lab Results  Component Value Date   VD25OH 85 10/30/2022   Current  Outpatient Medications on File Prior to Visit  Medication Sig   albuterol  HFA MDI  inhaler  1 to 2 puffs  4 x day or every 4 hours to rescue asthma   aspirin 81 MG tablet Take  every evening.    PULMICORT  0.5 MG/2ML neb  soln Take 2 mLs  by neb 2 times daily.   VITAMIN  5000 units  Take 10,000 Units  daily.    diphenhydrAMINE  25 MG  Take 50 mg daily.    FLONASE  nasal spray 2 sprays into both nostrils every evening   PERFOROMIST    20 MCG/2ML neb soln USE 1 VIAL VIA NEB   TWO TIMES A DAY   predniSONE   5 MG tablet Take 1 tablet daily with breakfast.   YUPELRI  175 MCG/3ML neb soln Take 3 mLs by neb daily.   rosuvastatin   5 MG tablet Take 1 tab   4 x / week (MWF&Sat)     traZODone  150 MG tablet TAKE 1 TABLET  1 HOUR BEFORE BEDTIME    ASPERCREME 10 % cream Apply as needed for muscle pain.   zinc g50 MG tablet Take  daily.    Allergies  Allergen Reactions   Niacin And Related Other (See Comments)    Unknown Childhood  reaction.     PMHx:   Past Medical History:  Diagnosis Date   COPD (chronic obstructive pulmonary disease) (HCC)    GERD (gastroesophageal reflux disease)    History of elevated lipids    Hyperlipidemia    IBS (irritable bowel syndrome)    Labile hypertension    patient is not on meds for hypertension   Prediabetes    Secondary polycythemia 05/25/2018   Tubular adenoma of colon    Vitamin D  deficiency    Immunization History  Administered Date(s) Administered   Fluad Quad(high Dose 65+) 06/13/2020   Influenza Split 06/20/2013, 06/20/2014, 06/28/2015   Influenza, High Dose Seasonal PF 06/17/2017, 05/24/2018, 06/14/2018, 06/29/2019, 06/18/2022, 06/15/2023   Influenza,inj,quad, With Preservative 07/21/2016   Influenza-Unspecified 06/17/2017   PFIZER Comirnaty(Gray Top)Covid-19 Tri-Sucrose Vaccine 06/15/2023   PFIZER(Purple Top)SARS-COV-2 Vaccination 09/26/2019, 10/17/2019, 06/19/2020   PPD Test 06/20/2014, 06/28/2015, 07/21/2016   Pfizer(Comirnaty)Fall Seasonal Vaccine 12 years and older 08/13/2022   Pneumococcal Conjugate-13 06/20/2014   Pneumococcal Polysaccharide-23 04/30/2009, 08/12/2017   Respiratory Syncytial Virus Vaccine,Recomb Aduvanted(Arexvy) 08/13/2022   Tdap 09/01/2009   Zoster Recombinant(Shingrix) 01/14/2022, 03/20/2022   Past Surgical History:  Procedure Laterality Date   BASAL CELL CARCINOMA EXCISION  2008   Left Reineck   COLONOSCOPY WITH PROPOFOL  N/A 10/26/2018   Procedure: COLONOSCOPY WITH PROPOFOL ;  Surgeon: Aneita Gwendlyn DASEN, MD;  Location: [facility];  Service: Endoscopy;  Laterality: N/A;   TESTICLE SURGERY     testicle removed   FHx:    Reviewed / unchanged  SHx:    Reviewed / unchanged  Systems Review:  Constitutional: Denies fever, chills, wt changes, headaches, insomnia, fatigue, night sweats, change in appetite. Eyes: Denies redness, blurred vision, diplopia, discharge, itchy, watery eyes.  ENT: Denies discharge, congestion, post nasal drip,  epistaxis, sore throat, earache, hearing loss, dental pain, tinnitus, vertigo, sinus pain, snoring.  CV: Denies chest pain, palpitations, irregular heartbeat, syncope, dyspnea, diaphoresis, orthopnea, PND, claudication or edema. Respiratory: denies cough, dyspnea, DOE, pleurisy, hoarseness, laryngitis, wheezing.  Gastrointestinal: Denies dysphagia, odynophagia, heartburn, reflux, water brash, abdominal pain or cramps, nausea, vomiting, bloating, diarrhea, constipation, hematemesis, melena, hematochezia  or hemorrhoids. Genitourinary: Denies dysuria, frequency, urgency, nocturia, hesitancy, discharge, hematuria or flank pain. Musculoskeletal:  Denies arthralgias, myalgias, stiffness, jt. swelling, pain, limping or strain/sprain.  Skin: Denies pruritus, rash, hives, warts, acne, eczema or change in skin lesion(s). Neuro: No weakness, tremor, incoordination, spasms, paresthesia or pain. Psychiatric: Denies confusion, memory loss or sensory loss. Endo: Denies change in weight, skin or hair change.  Heme/Lymph: No excessive bleeding, bruising or enlarged lymph nodes.  Physical Exam  There were no vitals taken for this visit.  Appears well nourished, well groomed  and in no distress. With nasal Oxygen  .   Eyes: PERRLA, EOMs, conjunctiva no swelling or erythema. Sinuses: No frontal/maxillary tenderness ENT/Mouth: EAC's clear, TM's nl w/o erythema, bulging. Nares clear w/o erythema, swelling, exudates. Oropharynx clear without erythema or exudates. Oral hygiene is good. Tongue normal, non obstructing. Hearing intact.  Neck: Supple. Thyroid  nl. Car 2+/2+ without bruits, nodes or JVD. Chest: Respirations nl with BS decreased  clear& equal w/o rales, rhonchi, wheezing or stridor.  Cor: Heart sounds normal w/ regular rate and rhythm without sig. murmurs, gallops, clicks or rubs. Peripheral pulses normal and equal  without edema.  Abdomen: Soft & bowel sounds normal. Non-tender w/o guarding, rebound,  hernias, masses or organomegaly.  Lymphatics: Unremarkable.  Musculoskeletal: Full ROM all peripheral extremities, joint stability, 5/5 strength and normal gait.  Skin: Warm, dry without exposed rashes, lesions or ecchymosis apparent.  Neuro: Cranial nerves intact, reflexes equal bilaterally. Sensory-motor testing grossly intact. Tendon reflexes grossly intact.  Pysch: Alert & oriented x 3.  Insight and judgement nl & appropriate. No ideations.  Assessment and Plan:  1. Acute appendicitis with generalized peritonitis without gangrene, perforation, or abscess (Primary)  - CBC with Differential/Platelet   2. Hyperlipidemia, mixed  - Continue diet/meds, exercise,& lifestyle modifications.  - Continue monitor periodic cholesterol/liver & renal functions     3. Essential hypertension  - Continue medication, monitor blood pressure at home.  - Continue DASH diet.  Reminder to go to the ER if any CP,  SOB, nausea, dizziness, severe HA, changes vision/speech.    - CBC with Differential/Platelet - COMPLETE METABOLIC PANEL WITH GFR - Magnesium - TSH   4. Abnormal glucose  - Continue diet, exercise, lifestyle modifications.  - Monitor appropriate labs.   - Hemoglobin A1c - Insulin , random   5. Vitamin D  deficiency  - Continue supplementation.  - VITAMIN D  25 Hydroxy    6. 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HCC)   7. Chronic hypoxemic respiratory failure (HCC)   8. Medication management  - CBC with Differential/Platelet - COMPLETE METABOLIC PANEL WITH GFR - Magnesium - TSH - Hemoglobin A1c - Insulin , random - VITAMIN D  25 Hydroxy        Discussed  regular exercise, BP monitoring, weight control to achieve/maintain BMI less than 25 and discussed meds and SE's. Recommended labs to assess and monitor clinical status with further disposition pending results of labs. Over 30 minutes of exam, counseling, chart review was performed.   Elsie JONETTA Richards, MD

## 2023-09-03 NOTE — Progress Notes (Deleted)
 Hospital follow up  Assessment and Plan: Hospital visit follow up for:      All medications were reviewed with patient and family and fully reconciled. All questions answered fully, and patient and family members were encouraged to call the office with any further questions or concerns. Discussed goal to avoid readmission related to this diagnosis.  There are no discontinued medications.  CAN NOT DO FOR BCBS REGULAR OR MEDICARE***  Over 40 minutes of exam, counseling, chart review, and complex, high/moderate level critical decision making was performed this visit.   Future Appointments  Date Time Provider Department Center  09/03/2023 11:30 AM Laurice President, NP [facility] None  09/21/2023  9:30 AM Tonita Fallow, MD [facility] None  12/21/2023 10:00 AM Tonita Fallow, MD [facility] None  06/20/2024 10:00 AM Jude Lonell BRAVO, NP [facility] None     HPI 73 y.o.male presents for follow up for transition from recent hospitalization or SNIF stay. Admit date to the hospital was 10/26/18, patient was discharged from the hospital on 10/26/18 and our clinical staff contacted the office the day after discharge to set up a follow up appointment. The discharge summary, medications, and diagnostic test results were reviewed before meeting with the patient. The patient was admitted for:       Home health {ACTION; IS/IS WNU:[id number]} involved.   Images while in the hospital: No results found.   Current Outpatient Medications (Endocrine & Metabolic):    predniSONE  (DELTASONE ) 5 MG tablet, Take 1 tablet (5 mg total) by mouth daily with breakfast.  Current Outpatient Medications (Cardiovascular):    rosuvastatin  (CRESTOR ) 5 MG tablet, Take 1 tab   4 x / week (MWF&Sat)  for Cholesterol.  Current Outpatient Medications (Respiratory):    albuterol  (VENTOLIN  HFA) 108 (90 Base) MCG/ACT inhaler, Inhale  1 to 2 puffs (15 minutes apart)     4 x day or every 4 hours to rescue asthma   budesonide   (PULMICORT ) 0.5 MG/2ML nebulizer solution, Take 2 mLs (0.5 mg total) by nebulization 2 (two) times daily.   diphenhydrAMINE (BENADRYL) 25 MG tablet, Take 50 mg by mouth daily.    fluticasone  (FLONASE ) 50 MCG/ACT nasal spray, Place 2 sprays into both nostrils daily. (Patient taking differently: Place 2 sprays into both nostrils every evening.)   formoterol  (PERFOROMIST ) 20 MCG/2ML nebulizer solution, USE 1 VIAL VIA NEBULIER TWO TIMES A DAY   revefenacin  (YUPELRI ) 175 MCG/3ML nebulizer solution, Take 3 mLs (175 mcg total) by nebulization daily.  Current Outpatient Medications (Analgesics):    aspirin 81 MG tablet, Take 81 mg by mouth every evening.    Current Outpatient Medications (Other):    Cholecalciferol (VITAMIN D3) 5000 units CAPS, Take 10,000 Units by mouth daily.    traZODone  (DESYREL ) 150 MG tablet, TAKE 1 TABLET BY MOUTH 1 HOUR BEFORE BEDTIME AS NEEDED FOR SLEEP   trolamine salicylate (ASPERCREME) 10 % cream, Apply 1 application topically as needed for muscle pain.   zinc gluconate 50 MG tablet, Take 50 mg by mouth daily.  Past Medical History:  Diagnosis Date   COPD (chronic obstructive pulmonary disease) (HCC)    GERD (gastroesophageal reflux disease)    History of elevated lipids    Hyperlipidemia    IBS (irritable bowel syndrome)    Labile hypertension    patient is not on meds for hypertension   Prediabetes    Secondary polycythemia 05/25/2018   Tubular adenoma of colon    Vitamin D  deficiency      Allergies  Allergen Reactions  Niacin And Related Other (See Comments)    Unknown Childhood reaction.    ROS: all negative except above.   Physical Exam: There were no vitals filed for this visit. There were no vitals taken for this visit. General Appearance: Well nourished, in no apparent distress. Eyes: PERRLA, EOMs, conjunctiva no swelling or erythema Sinuses: No Frontal/maxillary tenderness ENT/Mouth: Ext aud canals clear, TMs without erythema, bulging. No  erythema, swelling, or exudate on post pharynx.  Tonsils not swollen or erythematous. Hearing normal.  Neck: Supple, thyroid  normal.  Respiratory: Respiratory effort normal, BS equal bilaterally without rales, rhonchi, wheezing or stridor.  Cardio: RRR with no MRGs. Brisk peripheral pulses without edema.  Abdomen: Soft, + BS.  Non tender, no guarding, rebound, hernias, masses. Lymphatics: Non tender without lymphadenopathy.  Musculoskeletal: Full ROM, 5/5 strength, normal gait.  Skin: Warm, dry without rashes, lesions, ecchymosis.  Neuro: Cranial nerves intact. Normal muscle tone, no cerebellar symptoms. Sensation intact.  Psych: Awake and oriented X 3, normal affect, Insight and Judgment appropriate.     BASCOM NECESSARY, NP 9:07 AM [facility]

## 2023-09-04 ENCOUNTER — Ambulatory Visit (INDEPENDENT_AMBULATORY_CARE_PROVIDER_SITE_OTHER): Payer: Medicare Other | Admitting: Internal Medicine

## 2023-09-04 VITALS — BP 106/74 | HR 81 | Temp 97.9°F | Resp 16 | Ht 64.0 in | Wt 147.6 lb

## 2023-09-04 DIAGNOSIS — E559 Vitamin D deficiency, unspecified: Secondary | ICD-10-CM | POA: Diagnosis not present

## 2023-09-04 DIAGNOSIS — J9611 Chronic respiratory failure with hypoxia: Secondary | ICD-10-CM | POA: Diagnosis not present

## 2023-09-04 DIAGNOSIS — K352 Acute appendicitis with generalized peritonitis, without perforation or abscess: Secondary | ICD-10-CM | POA: Diagnosis not present

## 2023-09-04 DIAGNOSIS — R7309 Other abnormal glucose: Secondary | ICD-10-CM | POA: Diagnosis not present

## 2023-09-04 DIAGNOSIS — E782 Mixed hyperlipidemia: Secondary | ICD-10-CM | POA: Diagnosis not present

## 2023-09-04 DIAGNOSIS — Z79899 Other long term (current) drug therapy: Secondary | ICD-10-CM | POA: Diagnosis not present

## 2023-09-04 DIAGNOSIS — J449 Chronic obstructive pulmonary disease, unspecified: Secondary | ICD-10-CM

## 2023-09-04 DIAGNOSIS — Z1330 Encounter for screening examination for mental health and behavioral disorders, unspecified: Secondary | ICD-10-CM | POA: Diagnosis not present

## 2023-09-04 DIAGNOSIS — K3580 Unspecified acute appendicitis: Secondary | ICD-10-CM | POA: Diagnosis not present

## 2023-09-04 DIAGNOSIS — I10 Essential (primary) hypertension: Secondary | ICD-10-CM

## 2023-09-05 ENCOUNTER — Encounter: Payer: Self-pay | Admitting: Internal Medicine

## 2023-09-05 ENCOUNTER — Other Ambulatory Visit: Payer: Self-pay | Admitting: Internal Medicine

## 2023-09-05 MED ORDER — DEXAMETHASONE 4 MG PO TABS: ORAL_TABLET | ORAL | 1 refills | Status: DC

## 2023-09-05 NOTE — Progress Notes (Signed)
[][][][][][][][][][][][][][][][][][][][][][][][][][][][][][][][][][][][][][][][][]][][][][][][][][][][][][][][][][][][][][][][][[][][][][]  [][][][][][][][][][][][][][][][][][][][][][][][][][][][][][][][][][][][][][][][][]][][][][][][][][][][][][][][][][][][][][][][][[][][][][]  -  Test results slightly outside the reference range are not unusual. If there is anything important, I will review this with you,  otherwise it is considered normal test values.  If you have further questions,  please do not hesitate to contact me at the office or via My Chart.   [] [] [] [] [] [] [] [] [] [] [] [] [] [] [] [] [] [] [] [] [] [] [] [] [] [] [] [] [] [] [] [] [] [] [] [] [] [] [] [] [] ][] [] [] [] [] [] [] [] [] [] [] [] [] [] [] [] [] [] [] [] [] [] [[] [] [] [] []   [] [] [] [] [] [] [] [] [] [] [] [] [] [] [] [] [] [] [] [] [] [] [] [] [] [] [] [] [] [] [] [] [] [] [] [] [] [] [] [] [] ][] [] [] [] [] [] [] [] [] [] [] [] [] [] [] [] [] [] [] [] [] [] [[] [] [] [] []   -  CBC - Normal - OK - Red cell county is Normal   - WBC only very slightly elevated which is a result of being on Steroids .    [] [] [] [] [] [] [] [] [] [] [] [] [] [] [] [] [] [] [] [] [] [] [] [] [] [] [] [] [] [] [] [] [] [] [] [] [] [] [] [] [] ][] [] [] [] [] [] [] [] [] [] [] [] [] [] [] [] [] [] [] [] [] [] [[] [] [] [] []   -  Potassium ( KCL) is slightly elevated which may be due to some hemolysis of   the blood during the venipuncture   - But  suggest for now that you avoid Salt Substitutes ) with KCL in it   [] [] [] [] [] [] [] [] [] [] [] [] [] [] [] [] [] [] [] [] [] [] [] [] [] [] [] [] [] [] [] [] [] [] [] [] [] [] [] [] [] ][] [] [] [] [] [] [] [] [] [] [] [] [] [] [] [] [] [] [] [] [] [] [[] [] [] [] []   -  A1c = 6.4%  is slightly elevated in the PreDiabetic range as it has been for                                                                                                        the last 3 months, So   - Avoid Sweets, Candy & White Stuff   - White [place], White [place], White Flour  - Breads &  Pasta  [] [] [] [] [] [] [] [] [] [] [] [] [] [] [] [] [] [] [] [] [] [] [] [] [] [] [] [] [] [] [] [] [] [] [] [] [] [] [] [] [] ][] [] [] [] [] [] [] [] [] [] [] [] [] [] [] [] [] [] [] [] [] [] [[] [] [] [] []   -  Thyroid  - Normal &  OK   - Vitamin D  = 71 - Excellent   [] [] [] [] [] [] [] [] [] [] [] [] [] [] [] [] [] [] [] [] [] [] [] [] [] [] [] [] [] [] [] [] [] [] [] [] [] [] [] [] [] ][] [] [] [] [] [] [] [] [] [] [] [] [] [] [] [] [] [] [] [] [] [] [[] [] [] [] []

## 2023-09-06 ENCOUNTER — Encounter: Payer: Self-pay | Admitting: Internal Medicine

## 2023-09-07 LAB — COMPLETE METABOLIC PANEL WITH GFR
AG Ratio: 1.4 (calc) (ref 1.0–2.5)
ALT: 23 U/L (ref 9–46)
AST: 19 U/L (ref 10–35)
Albumin: 4.2 g/dL (ref 3.6–5.1)
Alkaline phosphatase (APISO): 88 U/L (ref 35–144)
BUN: 17 mg/dL (ref 7–25)
CO2: 32 mmol/L (ref 20–32)
Calcium: 9.7 mg/dL (ref 8.6–10.3)
Chloride: 95 mmol/L — ABNORMAL LOW (ref 98–110)
Creat: 0.87 mg/dL (ref 0.70–1.28)
Globulin: 3.1 g/dL (ref 1.9–3.7)
Glucose, Bld: 108 mg/dL — ABNORMAL HIGH (ref 65–99)
Potassium: 5.4 mmol/L — ABNORMAL HIGH (ref 3.5–5.3)
Sodium: 136 mmol/L (ref 135–146)
Total Bilirubin: 0.3 mg/dL (ref 0.2–1.2)
Total Protein: 7.3 g/dL (ref 6.1–8.1)
eGFR: 92 mL/min/{1.73_m2} (ref >=60)

## 2023-09-07 LAB — TSH: TSH: 2.08 m[IU]/L (ref 0.40–4.50)

## 2023-09-07 LAB — CBC WITH DIFFERENTIAL/PLATELET
Absolute Lymphocytes: 1154 {cells}/uL (ref 850–3900)
Absolute Monocytes: 788 {cells}/uL (ref 200–950)
Basophils Absolute: 44 {cells}/uL (ref 0–200)
Basophils Relative: 0.4 %
Eosinophils Absolute: 89 {cells}/uL (ref 15–500)
Eosinophils Relative: 0.8 %
HCT: 42.4 % (ref 38.5–50.0)
Hemoglobin: 13.8 g/dL (ref 13.2–17.1)
MCH: 28.5 pg (ref 27.0–33.0)
MCHC: 32.5 g/dL (ref 32.0–36.0)
MCV: 87.6 fL (ref 80.0–100.0)
MPV: 10 fL (ref 7.5–12.5)
Monocytes Relative: 7.1 %
Neutro Abs: 9024 {cells}/uL — ABNORMAL HIGH (ref 1500–7800)
Neutrophils Relative %: 81.3 %
Platelets: 371 10*3/uL (ref 140–400)
RBC: 4.84 10*6/uL (ref 4.20–5.80)
RDW: 11.7 % (ref 11.0–15.0)
Total Lymphocyte: 10.4 %
WBC: 11.1 10*3/uL — ABNORMAL HIGH (ref 3.8–10.8)

## 2023-09-07 LAB — HEMOGLOBIN A1C
Hgb A1c MFr Bld: 6.4 %{Hb} — ABNORMAL HIGH (ref <5.7)
Mean Plasma Glucose: 137 mg/dL
eAG (mmol/L): 7.6 mmol/L

## 2023-09-07 LAB — MAGNESIUM: Magnesium: 2.1 mg/dL (ref 1.5–2.5)

## 2023-09-07 LAB — VITAMIN D 25 HYDROXY (VIT D DEFICIENCY, FRACTURES): Vit D, 25-Hydroxy: 71 ng/mL (ref 30–100)

## 2023-09-07 LAB — INSULIN, RANDOM: Insulin: 18.4 u[IU]/mL

## 2023-09-21 ENCOUNTER — Ambulatory Visit: Payer: Medicare Other | Admitting: Internal Medicine

## 2023-09-29 ENCOUNTER — Encounter: Payer: Self-pay | Admitting: Internal Medicine

## 2023-10-09 ENCOUNTER — Encounter: Payer: Self-pay | Admitting: Internal Medicine

## 2023-10-09 ENCOUNTER — Other Ambulatory Visit: Payer: Self-pay | Admitting: Internal Medicine

## 2023-10-09 DIAGNOSIS — J449 Chronic obstructive pulmonary disease, unspecified: Secondary | ICD-10-CM

## 2023-10-12 ENCOUNTER — Ambulatory Visit: Payer: Medicare Other | Admitting: Family Medicine

## 2023-10-12 ENCOUNTER — Encounter: Payer: Self-pay | Admitting: Family Medicine

## 2023-10-12 ENCOUNTER — Ambulatory Visit (INDEPENDENT_AMBULATORY_CARE_PROVIDER_SITE_OTHER): Payer: Medicare Other

## 2023-10-12 VITALS — BP 114/58 | HR 114 | Temp 99.3°F | Resp 20 | Ht 64.0 in | Wt 143.0 lb

## 2023-10-12 DIAGNOSIS — R0902 Hypoxemia: Secondary | ICD-10-CM

## 2023-10-12 DIAGNOSIS — L03114 Cellulitis of left upper limb: Secondary | ICD-10-CM

## 2023-10-12 DIAGNOSIS — J441 Chronic obstructive pulmonary disease with (acute) exacerbation: Secondary | ICD-10-CM

## 2023-10-12 DIAGNOSIS — B338 Other specified viral diseases: Secondary | ICD-10-CM

## 2023-10-12 DIAGNOSIS — R918 Other nonspecific abnormal finding of lung field: Secondary | ICD-10-CM | POA: Diagnosis not present

## 2023-10-12 DIAGNOSIS — R059 Cough, unspecified: Secondary | ICD-10-CM | POA: Diagnosis not present

## 2023-10-12 DIAGNOSIS — J189 Pneumonia, unspecified organism: Secondary | ICD-10-CM | POA: Diagnosis not present

## 2023-10-12 DIAGNOSIS — R509 Fever, unspecified: Secondary | ICD-10-CM | POA: Diagnosis not present

## 2023-10-12 LAB — POCT RESPIRATORY SYNCYTIAL VIRUS: RSV Rapid Ag: POSITIVE

## 2023-10-12 LAB — POC COVID19 BINAXNOW: SARS Coronavirus 2 Ag: NEGATIVE

## 2023-10-12 LAB — POCT INFLUENZA A/B
Influenza A, POC: NEGATIVE
Influenza B, POC: NEGATIVE

## 2023-10-12 MED ORDER — PREDNISONE 20 MG PO TABS: 40.0000 mg | ORAL_TABLET | Freq: Every day | ORAL | 0 refills | Status: DC

## 2023-10-12 MED ORDER — AZITHROMYCIN 250 MG PO TABS: ORAL_TABLET | ORAL | 0 refills | Status: AC

## 2023-10-12 MED ORDER — CEPHALEXIN 500 MG PO CAPS: 500.0000 mg | ORAL_CAPSULE | Freq: Two times a day (BID) | ORAL | 0 refills | Status: AC

## 2023-10-12 MED ORDER — ALBUTEROL SULFATE HFA 108 (90 BASE) MCG/ACT IN AERS: 1.0000 | INHALATION_SPRAY | RESPIRATORY_TRACT | 1 refills | Status: AC | PRN

## 2023-10-12 NOTE — Progress Notes (Signed)
 Assessment & Plan:  1-3. COPD with acute exacerbation (HCC) (Primary)/RSV Infection/Hypoxia COPD exacerbation due to RSV infection.  Strongly encouraged patient to go to the emergency department due to oxygen  saturation of 73%.  Patient has no desire to go to the emergency department and wishes to be treated as an outpatient.  Education provided on COPD exacerbations and hypoxia.  Encouraged regular use of his albuterol  inhaler.  Discussed signs and symptoms that should prompt him to go to the emergency room. - albuterol  (VENTOLIN  HFA) 108 (90 Base) MCG/ACT inhaler; Inhale 1-2 puffs into the lungs every 4 (four) hours as needed for wheezing or shortness of breath.  Dispense: 18 each; Refill: 1 - azithromycin  (ZITHROMAX ) 250 MG tablet; Take 2 tablets (500 mg total) by mouth daily for 1 day, THEN 1 tablet (250 mg total) daily for 4 days.  Dispense: 6 each; Refill: 0 - cephALEXin  (KEFLEX ) 500 MG capsule; Take 1 capsule (500 mg total) by mouth 2 (two) times daily for 10 days.  Dispense: 20 capsule; Refill: 0 - predniSONE  (DELTASONE ) 20 MG tablet; Take 2 tablets (40 mg total) by mouth daily for 5 days.  Dispense: 10 tablet; Refill: 0 - DG Chest 2 View; Future - POC COVID-19 BinaxNow - POCT Influenza A/B - POCT respiratory syncytial virus - DG Chest 2 View; Future  Results for orders placed or performed in visit on 10/12/23  POC COVID-19 BinaxNow  Result Value Ref Range   SARS Coronavirus 2 Ag Negative Negative  POCT Influenza A/B  Result Value Ref Range   Influenza A, POC Negative Negative   Influenza B, POC Negative Negative  POCT respiratory syncytial virus  Result Value Ref Range   RSV Rapid Ag pos    3. Cellulitis of left elbow Education provided on cellulitis. - cephALEXin  (KEFLEX ) 500 MG capsule; Take 1 capsule (500 mg total) by mouth 2 (two) times daily for 10 days.  Dispense: 20 capsule; Refill: 0   Follow up plan: Return in about 3 days (around 10/15/2023) for follow-up of low  O2.  Hershel Los, MSN, APRN, FNP-C  Subjective:  HPI: Douglas Barrera is a 73 y.o. male presenting on 10/12/2023 for Elbow Pain (Possible infection left elbow  - x 2days - red, swelling, heat and possible infection )  Patient is accompanied by his wife, who he is okay with being present.  He is concerned about an infection in his left elbow that started a week ago. He denies any injury. Reports redness, swelling, tenderness when touched, and heat which has been worsening. He has been soaking it in Epsom salt and applying Neosporin.  Upon questioning patient reports a productive cough with green sputum and an increase in shortness of breath.  He has a cough and shortness of breath at baseline, however his cough is normally not productive.  He monitors his oxygen  saturation at home as he is oxygen  dependent on 5 L via nasal cannula and states he is normally in the 90s.  He did note dropping to the upper 80s a few times this morning.   ROS: Negative unless specifically indicated above in HPI.   Relevant past medical history reviewed and updated as indicated.   Allergies and medications reviewed and updated.   Current Outpatient Medications:    albuterol  (VENTOLIN  HFA) 108 (90 Base) MCG/ACT inhaler, INHALE 1 TO 2 PUFFS (15 MINUTES APART) 4 X DAY OR EVERY 4 HOURS TO RESCUE ASTHMA, Disp: 18 each, Rfl: 11   aspirin 81 MG tablet,  Take 81 mg by mouth every evening. , Disp: , Rfl:    aspirin EC 81 MG tablet, Take 81 mg by mouth daily., Disp: , Rfl:    budesonide  (PULMICORT ) 0.5 MG/2ML nebulizer solution, Take 2 mLs (0.5 mg total) by nebulization 2 (two) times daily., Disp: 120 mL, Rfl: 11   Cholecalciferol (VITAMIN D3) 5000 units CAPS, Take 10,000 Units by mouth daily. , Disp: , Rfl:    diphenhydrAMINE (BENADRYL) 25 MG tablet, Take 50 mg by mouth daily. , Disp: , Rfl:    fluticasone  (FLONASE ) 50 MCG/ACT nasal spray, Place 2 sprays into both nostrils daily. (Patient taking differently: Place 2 sprays  into both nostrils every evening.), Disp: 48 g, Rfl: 3   formoterol  (PERFOROMIST ) 20 MCG/2ML nebulizer solution, USE 1 VIAL VIA NEBULIER TWO TIMES A DAY, Disp: 360 mL, Rfl: 11   predniSONE  (DELTASONE ) 5 MG tablet, Take 5 mg by mouth daily with breakfast., Disp: , Rfl:    revefenacin  (YUPELRI ) 175 MCG/3ML nebulizer solution, Take 3 mLs (175 mcg total) by nebulization daily., Disp: 90 mL, Rfl: 11   rosuvastatin  (CRESTOR ) 5 MG tablet, Take 1 tab   4 x / week (MWF&Sat)  for Cholesterol., Disp: 52 tablet, Rfl: 3   traZODone  (DESYREL ) 150 MG tablet, TAKE 1 TABLET BY MOUTH 1 HOUR BEFORE BEDTIME AS NEEDED FOR SLEEP, Disp: 90 tablet, Rfl: 3   trolamine salicylate (ASPERCREME) 10 % cream, Apply 1 application topically as needed for muscle pain., Disp: , Rfl:    zinc gluconate 50 MG tablet, Take 50 mg by mouth daily., Disp: , Rfl:   Allergies  Allergen Reactions   Niacin And Related Other (See Comments)    Unknown Childhood reaction.    Objective:   BP (!) 114/58   Pulse (!) 114   Temp 99.3 F (37.4 C)   Resp 20   Ht 5\' 4"  (1.626 m)   Wt 143 lb (64.9 kg)   SpO2 (!) 73% Comment: on 5 liters oxygen   BMI 24.55 kg/m    Physical Exam Vitals reviewed.  Constitutional:      General: He is not in acute distress.    Appearance: Normal appearance. He is not ill-appearing, toxic-appearing or diaphoretic.  HENT:     Head: Normocephalic and atraumatic.  Eyes:     General: No scleral icterus.       Right eye: No discharge.        Left eye: No discharge.     Conjunctiva/sclera: Conjunctivae normal.  Cardiovascular:     Rate and Rhythm: Regular rhythm. Tachycardia present.     Heart sounds: Normal heart sounds. No murmur heard.    No friction rub. No gallop.  Pulmonary:     Effort: Tachypnea present. No respiratory distress.     Breath sounds: Normal breath sounds. No stridor. No wheezing, rhonchi or rales.  Musculoskeletal:        General: Normal range of motion.     Cervical back: Normal  range of motion.  Skin:    General: Skin is warm and dry.     Comments: Left elbow with erythema, warmth, pockets of pus, and mild swelling (pictured below).  Neurological:     Mental Status: He is alert and oriented to person, place, and time. Mental status is at baseline.  Psychiatric:        Mood and Affect: Mood normal.        Behavior: Behavior normal.        Thought Content: Thought  content normal.        Judgment: Judgment normal.

## 2023-10-12 NOTE — Patient Instructions (Signed)
 Casimer Clear, FNP at [facility]

## 2023-10-14 NOTE — Progress Notes (Unsigned)
   Established Patient Office Visit  Subjective   Patient ID: Douglas Barrera, male    DOB: April 24, 1951  Age: 73 y.o. MRN: [id number]  No chief complaint on file.   HPI Patient presents today for follow up of RSV, hypoxia, cellulitis. Was seen by Deliah Boston, NP on 10/12/23. Treated with albuterol inhaler, Keflex, prednisone, azithromycin.  Reports compliance with medication regimen. Reports that he is still having shortness of breath and fatigue with activity.  Reports that oxygen saturation at home when resting is around 92-95.  When he gets up and goes to the bathroom and comes back, oxygen is around 85-90.  He is on continuous oxygen at home. Reports that he is feeling much better than he was. Reports that the infection on the left elbow is healing also. Denies any new fever, chills, upset stomach, other symptoms. Denies other concerns. Medical history as outlined below.  ROS Per HPI    Objective:     BP 110/60 (BP Location: Left Arm, Patient Position: Sitting)   Pulse (!) 116   Temp 98.5 F (36.9 C) (Oral)   Ht 5\' 4"  (1.626 m)   Wt 143 lb (64.9 kg)   SpO2 (!) 88% Comment: 5 liters of oxygen  BMI 24.55 kg/m   Physical Exam Vitals and nursing note reviewed.  Constitutional:      Appearance: He is ill-appearing.     Comments: Appears fatigued, in w/c for visit   HENT:     Head: Normocephalic and atraumatic.     Nose: No congestion.     Mouth/Throat:     Mouth: Mucous membranes are moist.     Pharynx: Oropharynx is clear. No oropharyngeal exudate or posterior oropharyngeal erythema.  Eyes:     Extraocular Movements: Extraocular movements intact.  Cardiovascular:     Rate and Rhythm: Regular rhythm. Tachycardia present.  Pulmonary:     Effort: Pulmonary effort is normal. No respiratory distress.     Breath sounds: No stridor. Wheezing and rhonchi present. No rales.     Comments: On pulse O2 via [place] Musculoskeletal:     Cervical back: Normal range of motion.   Lymphadenopathy:     Cervical: No cervical adenopathy.  Skin:    Comments: L elbow mildly erythematous, pus pockets have resolved, non tender, no heat noted, no bleeding, no discharge from the area  Neurological:     Mental Status: He is alert.  Psychiatric:        Mood and Affect: Mood normal.      No results found for any visits on 10/16/23.   The 10-year ASCVD risk score (Arnett DK, et al., 2019) is: 11.9%    Assessment & Plan:   RSV infection -     predniSONE; Take 2 tablets (40 mg total) by mouth daily for 5 days.  Dispense: 10 tablet; Refill: 0  Hypoxia - Continue O2 at home Cellulitis of left elbow - Continue Keflex COPD with acute exacerbation (HCC) -     predniSONE; Take 2 tablets (40 mg total) by mouth daily for 5 days.  Dispense: 10 tablet; Refill: 0   Return if symptoms worsen or fail to improve.    Moshe Cipro, FNP

## 2023-10-16 ENCOUNTER — Ambulatory Visit (INDEPENDENT_AMBULATORY_CARE_PROVIDER_SITE_OTHER): Payer: Medicare Other | Admitting: Family Medicine

## 2023-10-16 ENCOUNTER — Encounter: Payer: Self-pay | Admitting: Family Medicine

## 2023-10-16 VITALS — BP 110/60 | HR 116 | Temp 98.5°F | Ht 64.0 in | Wt 143.0 lb

## 2023-10-16 DIAGNOSIS — L03114 Cellulitis of left upper limb: Secondary | ICD-10-CM | POA: Diagnosis not present

## 2023-10-16 DIAGNOSIS — B338 Other specified viral diseases: Secondary | ICD-10-CM

## 2023-10-16 DIAGNOSIS — R0902 Hypoxemia: Secondary | ICD-10-CM

## 2023-10-16 DIAGNOSIS — J441 Chronic obstructive pulmonary disease with (acute) exacerbation: Secondary | ICD-10-CM | POA: Diagnosis not present

## 2023-10-16 MED ORDER — PREDNISONE 20 MG PO TABS: 40.0000 mg | ORAL_TABLET | Freq: Every day | ORAL | 0 refills | Status: AC

## 2023-11-03 ENCOUNTER — Encounter: Payer: Medicare Other | Admitting: Internal Medicine

## 2023-11-11 DIAGNOSIS — R7303 Prediabetes: Secondary | ICD-10-CM | POA: Diagnosis not present

## 2023-11-11 DIAGNOSIS — Z1211 Encounter for screening for malignant neoplasm of colon: Secondary | ICD-10-CM | POA: Diagnosis not present

## 2023-11-11 DIAGNOSIS — Z23 Encounter for immunization: Secondary | ICD-10-CM | POA: Diagnosis not present

## 2023-11-11 DIAGNOSIS — J9611 Chronic respiratory failure with hypoxia: Secondary | ICD-10-CM | POA: Diagnosis not present

## 2023-11-11 DIAGNOSIS — J410 Simple chronic bronchitis: Secondary | ICD-10-CM | POA: Diagnosis not present

## 2023-11-11 DIAGNOSIS — D492 Neoplasm of unspecified behavior of bone, soft tissue, and skin: Secondary | ICD-10-CM | POA: Diagnosis not present

## 2023-11-11 DIAGNOSIS — Z136 Encounter for screening for cardiovascular disorders: Secondary | ICD-10-CM | POA: Diagnosis not present

## 2023-11-11 DIAGNOSIS — Z87891 Personal history of nicotine dependence: Secondary | ICD-10-CM | POA: Diagnosis not present

## 2023-11-11 DIAGNOSIS — E875 Hyperkalemia: Secondary | ICD-10-CM | POA: Diagnosis not present

## 2023-11-11 DIAGNOSIS — Z1212 Encounter for screening for malignant neoplasm of rectum: Secondary | ICD-10-CM | POA: Diagnosis not present

## 2023-11-11 DIAGNOSIS — Z79899 Other long term (current) drug therapy: Secondary | ICD-10-CM | POA: Diagnosis not present

## 2023-11-24 DIAGNOSIS — D649 Anemia, unspecified: Secondary | ICD-10-CM | POA: Diagnosis present

## 2023-11-24 DIAGNOSIS — R739 Hyperglycemia, unspecified: Secondary | ICD-10-CM | POA: Diagnosis not present

## 2023-11-24 DIAGNOSIS — Z87891 Personal history of nicotine dependence: Secondary | ICD-10-CM | POA: Diagnosis not present

## 2023-11-24 DIAGNOSIS — R0902 Hypoxemia: Secondary | ICD-10-CM | POA: Diagnosis not present

## 2023-11-24 DIAGNOSIS — Z9981 Dependence on supplemental oxygen: Secondary | ICD-10-CM | POA: Diagnosis not present

## 2023-11-24 DIAGNOSIS — E873 Alkalosis: Secondary | ICD-10-CM | POA: Diagnosis not present

## 2023-11-24 DIAGNOSIS — J9811 Atelectasis: Secondary | ICD-10-CM | POA: Diagnosis not present

## 2023-11-24 DIAGNOSIS — R0602 Shortness of breath: Secondary | ICD-10-CM | POA: Diagnosis not present

## 2023-11-24 DIAGNOSIS — Z1152 Encounter for screening for COVID-19: Secondary | ICD-10-CM | POA: Diagnosis not present

## 2023-11-24 DIAGNOSIS — Z7951 Long term (current) use of inhaled steroids: Secondary | ICD-10-CM | POA: Diagnosis not present

## 2023-11-24 DIAGNOSIS — Z9049 Acquired absence of other specified parts of digestive tract: Secondary | ICD-10-CM | POA: Diagnosis not present

## 2023-11-24 DIAGNOSIS — R54 Age-related physical debility: Secondary | ICD-10-CM | POA: Diagnosis present

## 2023-11-24 DIAGNOSIS — R918 Other nonspecific abnormal finding of lung field: Secondary | ICD-10-CM | POA: Diagnosis not present

## 2023-11-24 DIAGNOSIS — I082 Rheumatic disorders of both aortic and tricuspid valves: Secondary | ICD-10-CM | POA: Diagnosis not present

## 2023-11-24 DIAGNOSIS — R42 Dizziness and giddiness: Secondary | ICD-10-CM | POA: Diagnosis not present

## 2023-11-24 DIAGNOSIS — J9602 Acute respiratory failure with hypercapnia: Secondary | ICD-10-CM | POA: Diagnosis not present

## 2023-11-24 DIAGNOSIS — Z79899 Other long term (current) drug therapy: Secondary | ICD-10-CM | POA: Diagnosis not present

## 2023-11-24 DIAGNOSIS — J9601 Acute respiratory failure with hypoxia: Secondary | ICD-10-CM | POA: Diagnosis not present

## 2023-11-24 DIAGNOSIS — R069 Unspecified abnormalities of breathing: Secondary | ICD-10-CM | POA: Diagnosis not present

## 2023-11-24 DIAGNOSIS — R0689 Other abnormalities of breathing: Secondary | ICD-10-CM | POA: Diagnosis not present

## 2023-11-24 DIAGNOSIS — J441 Chronic obstructive pulmonary disease with (acute) exacerbation: Secondary | ICD-10-CM | POA: Diagnosis not present

## 2023-11-24 DIAGNOSIS — E7800 Pure hypercholesterolemia, unspecified: Secondary | ICD-10-CM | POA: Diagnosis not present

## 2023-11-24 DIAGNOSIS — J189 Pneumonia, unspecified organism: Secondary | ICD-10-CM | POA: Diagnosis not present

## 2023-11-25 ENCOUNTER — Telehealth: Payer: Self-pay | Admitting: Pulmonary Disease

## 2023-11-25 NOTE — Telephone Encounter (Signed)
 CMN received from [facility] 11/25/23

## 2023-11-25 NOTE — Telephone Encounter (Signed)
 CMN signed Kandice Robinsons NP) and faxed back to [facility] 11/25/23

## 2023-12-07 DIAGNOSIS — J9611 Chronic respiratory failure with hypoxia: Secondary | ICD-10-CM | POA: Diagnosis not present

## 2023-12-07 DIAGNOSIS — J449 Chronic obstructive pulmonary disease, unspecified: Secondary | ICD-10-CM | POA: Diagnosis not present

## 2023-12-07 DIAGNOSIS — J209 Acute bronchitis, unspecified: Secondary | ICD-10-CM | POA: Diagnosis not present

## 2023-12-09 DIAGNOSIS — J441 Chronic obstructive pulmonary disease with (acute) exacerbation: Secondary | ICD-10-CM | POA: Diagnosis not present

## 2023-12-09 DIAGNOSIS — J9601 Acute respiratory failure with hypoxia: Secondary | ICD-10-CM | POA: Diagnosis not present

## 2023-12-09 DIAGNOSIS — E785 Hyperlipidemia, unspecified: Secondary | ICD-10-CM | POA: Diagnosis not present

## 2023-12-09 DIAGNOSIS — Z87891 Personal history of nicotine dependence: Secondary | ICD-10-CM | POA: Diagnosis not present

## 2023-12-09 DIAGNOSIS — Z7952 Long term (current) use of systemic steroids: Secondary | ICD-10-CM | POA: Diagnosis not present

## 2023-12-09 DIAGNOSIS — Z9981 Dependence on supplemental oxygen: Secondary | ICD-10-CM | POA: Diagnosis not present

## 2023-12-09 DIAGNOSIS — J9602 Acute respiratory failure with hypercapnia: Secondary | ICD-10-CM | POA: Diagnosis not present

## 2023-12-11 DIAGNOSIS — J9602 Acute respiratory failure with hypercapnia: Secondary | ICD-10-CM | POA: Diagnosis not present

## 2023-12-11 DIAGNOSIS — Z9981 Dependence on supplemental oxygen: Secondary | ICD-10-CM | POA: Diagnosis not present

## 2023-12-11 DIAGNOSIS — J441 Chronic obstructive pulmonary disease with (acute) exacerbation: Secondary | ICD-10-CM | POA: Diagnosis not present

## 2023-12-11 DIAGNOSIS — E785 Hyperlipidemia, unspecified: Secondary | ICD-10-CM | POA: Diagnosis not present

## 2023-12-11 DIAGNOSIS — J9601 Acute respiratory failure with hypoxia: Secondary | ICD-10-CM | POA: Diagnosis not present

## 2023-12-11 DIAGNOSIS — Z87891 Personal history of nicotine dependence: Secondary | ICD-10-CM | POA: Diagnosis not present

## 2023-12-15 DIAGNOSIS — J9602 Acute respiratory failure with hypercapnia: Secondary | ICD-10-CM | POA: Diagnosis not present

## 2023-12-15 DIAGNOSIS — J441 Chronic obstructive pulmonary disease with (acute) exacerbation: Secondary | ICD-10-CM | POA: Diagnosis not present

## 2023-12-15 DIAGNOSIS — E785 Hyperlipidemia, unspecified: Secondary | ICD-10-CM | POA: Diagnosis not present

## 2023-12-15 DIAGNOSIS — Z87891 Personal history of nicotine dependence: Secondary | ICD-10-CM | POA: Diagnosis not present

## 2023-12-15 DIAGNOSIS — J449 Chronic obstructive pulmonary disease, unspecified: Secondary | ICD-10-CM | POA: Diagnosis not present

## 2023-12-15 DIAGNOSIS — J9601 Acute respiratory failure with hypoxia: Secondary | ICD-10-CM | POA: Diagnosis not present

## 2023-12-15 DIAGNOSIS — Z9981 Dependence on supplemental oxygen: Secondary | ICD-10-CM | POA: Diagnosis not present

## 2023-12-16 DIAGNOSIS — Z5320 Procedure and treatment not carried out because of patient's decision for unspecified reasons: Secondary | ICD-10-CM | POA: Diagnosis not present

## 2023-12-16 DIAGNOSIS — R7309 Other abnormal glucose: Secondary | ICD-10-CM | POA: Diagnosis not present

## 2023-12-16 DIAGNOSIS — Z1211 Encounter for screening for malignant neoplasm of colon: Secondary | ICD-10-CM | POA: Diagnosis not present

## 2023-12-16 DIAGNOSIS — Z23 Encounter for immunization: Secondary | ICD-10-CM | POA: Diagnosis not present

## 2023-12-16 DIAGNOSIS — Z1212 Encounter for screening for malignant neoplasm of rectum: Secondary | ICD-10-CM | POA: Diagnosis not present

## 2023-12-16 DIAGNOSIS — Z136 Encounter for screening for cardiovascular disorders: Secondary | ICD-10-CM | POA: Diagnosis not present

## 2023-12-16 DIAGNOSIS — J410 Simple chronic bronchitis: Secondary | ICD-10-CM | POA: Diagnosis not present

## 2023-12-16 DIAGNOSIS — Z0000 Encounter for general adult medical examination without abnormal findings: Secondary | ICD-10-CM | POA: Diagnosis not present

## 2023-12-18 DIAGNOSIS — E785 Hyperlipidemia, unspecified: Secondary | ICD-10-CM | POA: Diagnosis not present

## 2023-12-18 DIAGNOSIS — J9601 Acute respiratory failure with hypoxia: Secondary | ICD-10-CM | POA: Diagnosis not present

## 2023-12-18 DIAGNOSIS — J9602 Acute respiratory failure with hypercapnia: Secondary | ICD-10-CM | POA: Diagnosis not present

## 2023-12-18 DIAGNOSIS — J441 Chronic obstructive pulmonary disease with (acute) exacerbation: Secondary | ICD-10-CM | POA: Diagnosis not present

## 2023-12-18 DIAGNOSIS — Z9981 Dependence on supplemental oxygen: Secondary | ICD-10-CM | POA: Diagnosis not present

## 2023-12-18 DIAGNOSIS — Z87891 Personal history of nicotine dependence: Secondary | ICD-10-CM | POA: Diagnosis not present

## 2023-12-21 ENCOUNTER — Encounter: Payer: Medicare Other | Admitting: Internal Medicine

## 2023-12-22 DIAGNOSIS — Z87891 Personal history of nicotine dependence: Secondary | ICD-10-CM | POA: Diagnosis not present

## 2023-12-22 DIAGNOSIS — J9601 Acute respiratory failure with hypoxia: Secondary | ICD-10-CM | POA: Diagnosis not present

## 2023-12-22 DIAGNOSIS — J441 Chronic obstructive pulmonary disease with (acute) exacerbation: Secondary | ICD-10-CM | POA: Diagnosis not present

## 2023-12-22 DIAGNOSIS — Z9981 Dependence on supplemental oxygen: Secondary | ICD-10-CM | POA: Diagnosis not present

## 2023-12-22 DIAGNOSIS — J9602 Acute respiratory failure with hypercapnia: Secondary | ICD-10-CM | POA: Diagnosis not present

## 2023-12-22 DIAGNOSIS — E785 Hyperlipidemia, unspecified: Secondary | ICD-10-CM | POA: Diagnosis not present

## 2023-12-24 DIAGNOSIS — J9601 Acute respiratory failure with hypoxia: Secondary | ICD-10-CM | POA: Diagnosis not present

## 2023-12-24 DIAGNOSIS — J9602 Acute respiratory failure with hypercapnia: Secondary | ICD-10-CM | POA: Diagnosis not present

## 2023-12-24 DIAGNOSIS — Z9981 Dependence on supplemental oxygen: Secondary | ICD-10-CM | POA: Diagnosis not present

## 2023-12-24 DIAGNOSIS — J441 Chronic obstructive pulmonary disease with (acute) exacerbation: Secondary | ICD-10-CM | POA: Diagnosis not present

## 2023-12-24 DIAGNOSIS — E785 Hyperlipidemia, unspecified: Secondary | ICD-10-CM | POA: Diagnosis not present

## 2023-12-24 DIAGNOSIS — Z87891 Personal history of nicotine dependence: Secondary | ICD-10-CM | POA: Diagnosis not present

## 2023-12-26 DIAGNOSIS — J449 Chronic obstructive pulmonary disease, unspecified: Secondary | ICD-10-CM | POA: Diagnosis not present

## 2023-12-28 DIAGNOSIS — J449 Chronic obstructive pulmonary disease, unspecified: Secondary | ICD-10-CM | POA: Diagnosis not present

## 2023-12-29 DIAGNOSIS — Z9981 Dependence on supplemental oxygen: Secondary | ICD-10-CM | POA: Diagnosis not present

## 2023-12-29 DIAGNOSIS — J9602 Acute respiratory failure with hypercapnia: Secondary | ICD-10-CM | POA: Diagnosis not present

## 2023-12-29 DIAGNOSIS — E785 Hyperlipidemia, unspecified: Secondary | ICD-10-CM | POA: Diagnosis not present

## 2023-12-29 DIAGNOSIS — J441 Chronic obstructive pulmonary disease with (acute) exacerbation: Secondary | ICD-10-CM | POA: Diagnosis not present

## 2023-12-29 DIAGNOSIS — Z87891 Personal history of nicotine dependence: Secondary | ICD-10-CM | POA: Diagnosis not present

## 2023-12-29 DIAGNOSIS — J9601 Acute respiratory failure with hypoxia: Secondary | ICD-10-CM | POA: Diagnosis not present

## 2023-12-30 DIAGNOSIS — J9602 Acute respiratory failure with hypercapnia: Secondary | ICD-10-CM | POA: Diagnosis not present

## 2023-12-30 DIAGNOSIS — E785 Hyperlipidemia, unspecified: Secondary | ICD-10-CM | POA: Diagnosis not present

## 2023-12-30 DIAGNOSIS — Z87891 Personal history of nicotine dependence: Secondary | ICD-10-CM | POA: Diagnosis not present

## 2023-12-30 DIAGNOSIS — J9601 Acute respiratory failure with hypoxia: Secondary | ICD-10-CM | POA: Diagnosis not present

## 2023-12-30 DIAGNOSIS — J441 Chronic obstructive pulmonary disease with (acute) exacerbation: Secondary | ICD-10-CM | POA: Diagnosis not present

## 2023-12-30 DIAGNOSIS — Z9981 Dependence on supplemental oxygen: Secondary | ICD-10-CM | POA: Diagnosis not present

## 2024-01-06 DIAGNOSIS — J449 Chronic obstructive pulmonary disease, unspecified: Secondary | ICD-10-CM | POA: Diagnosis not present

## 2024-01-08 DIAGNOSIS — J9602 Acute respiratory failure with hypercapnia: Secondary | ICD-10-CM | POA: Diagnosis not present

## 2024-01-08 DIAGNOSIS — E785 Hyperlipidemia, unspecified: Secondary | ICD-10-CM | POA: Diagnosis not present

## 2024-01-08 DIAGNOSIS — Z9981 Dependence on supplemental oxygen: Secondary | ICD-10-CM | POA: Diagnosis not present

## 2024-01-08 DIAGNOSIS — J441 Chronic obstructive pulmonary disease with (acute) exacerbation: Secondary | ICD-10-CM | POA: Diagnosis not present

## 2024-01-08 DIAGNOSIS — J9601 Acute respiratory failure with hypoxia: Secondary | ICD-10-CM | POA: Diagnosis not present

## 2024-01-08 DIAGNOSIS — Z87891 Personal history of nicotine dependence: Secondary | ICD-10-CM | POA: Diagnosis not present

## 2024-01-08 DIAGNOSIS — Z7952 Long term (current) use of systemic steroids: Secondary | ICD-10-CM | POA: Diagnosis not present

## 2024-01-11 DIAGNOSIS — J449 Chronic obstructive pulmonary disease, unspecified: Secondary | ICD-10-CM | POA: Diagnosis not present

## 2024-01-12 DIAGNOSIS — Z9981 Dependence on supplemental oxygen: Secondary | ICD-10-CM | POA: Diagnosis not present

## 2024-01-12 DIAGNOSIS — J441 Chronic obstructive pulmonary disease with (acute) exacerbation: Secondary | ICD-10-CM | POA: Diagnosis not present

## 2024-01-12 DIAGNOSIS — J9602 Acute respiratory failure with hypercapnia: Secondary | ICD-10-CM | POA: Diagnosis not present

## 2024-01-12 DIAGNOSIS — J9601 Acute respiratory failure with hypoxia: Secondary | ICD-10-CM | POA: Diagnosis not present

## 2024-01-12 DIAGNOSIS — Z87891 Personal history of nicotine dependence: Secondary | ICD-10-CM | POA: Diagnosis not present

## 2024-01-12 DIAGNOSIS — E785 Hyperlipidemia, unspecified: Secondary | ICD-10-CM | POA: Diagnosis not present

## 2024-01-13 DIAGNOSIS — J449 Chronic obstructive pulmonary disease, unspecified: Secondary | ICD-10-CM | POA: Diagnosis not present

## 2024-01-18 DIAGNOSIS — J449 Chronic obstructive pulmonary disease, unspecified: Secondary | ICD-10-CM | POA: Diagnosis not present

## 2024-01-20 DIAGNOSIS — J449 Chronic obstructive pulmonary disease, unspecified: Secondary | ICD-10-CM | POA: Diagnosis not present

## 2024-01-22 DIAGNOSIS — E785 Hyperlipidemia, unspecified: Secondary | ICD-10-CM | POA: Diagnosis not present

## 2024-01-22 DIAGNOSIS — J9602 Acute respiratory failure with hypercapnia: Secondary | ICD-10-CM | POA: Diagnosis not present

## 2024-01-22 DIAGNOSIS — Z9981 Dependence on supplemental oxygen: Secondary | ICD-10-CM | POA: Diagnosis not present

## 2024-01-22 DIAGNOSIS — J441 Chronic obstructive pulmonary disease with (acute) exacerbation: Secondary | ICD-10-CM | POA: Diagnosis not present

## 2024-01-22 DIAGNOSIS — J9601 Acute respiratory failure with hypoxia: Secondary | ICD-10-CM | POA: Diagnosis not present

## 2024-01-22 DIAGNOSIS — Z87891 Personal history of nicotine dependence: Secondary | ICD-10-CM | POA: Diagnosis not present

## 2024-01-27 DIAGNOSIS — J449 Chronic obstructive pulmonary disease, unspecified: Secondary | ICD-10-CM | POA: Diagnosis not present

## 2024-01-28 DIAGNOSIS — Z9981 Dependence on supplemental oxygen: Secondary | ICD-10-CM | POA: Diagnosis not present

## 2024-01-28 DIAGNOSIS — J441 Chronic obstructive pulmonary disease with (acute) exacerbation: Secondary | ICD-10-CM | POA: Diagnosis not present

## 2024-01-28 DIAGNOSIS — J9602 Acute respiratory failure with hypercapnia: Secondary | ICD-10-CM | POA: Diagnosis not present

## 2024-01-28 DIAGNOSIS — E785 Hyperlipidemia, unspecified: Secondary | ICD-10-CM | POA: Diagnosis not present

## 2024-01-28 DIAGNOSIS — J9601 Acute respiratory failure with hypoxia: Secondary | ICD-10-CM | POA: Diagnosis not present

## 2024-01-28 DIAGNOSIS — Z87891 Personal history of nicotine dependence: Secondary | ICD-10-CM | POA: Diagnosis not present

## 2024-02-01 DIAGNOSIS — J449 Chronic obstructive pulmonary disease, unspecified: Secondary | ICD-10-CM | POA: Diagnosis not present

## 2024-02-03 DIAGNOSIS — J449 Chronic obstructive pulmonary disease, unspecified: Secondary | ICD-10-CM | POA: Diagnosis not present

## 2024-02-05 DIAGNOSIS — Z9981 Dependence on supplemental oxygen: Secondary | ICD-10-CM | POA: Diagnosis not present

## 2024-02-05 DIAGNOSIS — E785 Hyperlipidemia, unspecified: Secondary | ICD-10-CM | POA: Diagnosis not present

## 2024-02-05 DIAGNOSIS — J441 Chronic obstructive pulmonary disease with (acute) exacerbation: Secondary | ICD-10-CM | POA: Diagnosis not present

## 2024-02-05 DIAGNOSIS — J9602 Acute respiratory failure with hypercapnia: Secondary | ICD-10-CM | POA: Diagnosis not present

## 2024-02-05 DIAGNOSIS — J9601 Acute respiratory failure with hypoxia: Secondary | ICD-10-CM | POA: Diagnosis not present

## 2024-02-05 DIAGNOSIS — Z87891 Personal history of nicotine dependence: Secondary | ICD-10-CM | POA: Diagnosis not present

## 2024-02-08 DIAGNOSIS — J449 Chronic obstructive pulmonary disease, unspecified: Secondary | ICD-10-CM | POA: Diagnosis not present

## 2024-02-10 DIAGNOSIS — J449 Chronic obstructive pulmonary disease, unspecified: Secondary | ICD-10-CM | POA: Diagnosis not present

## 2024-02-15 DIAGNOSIS — J449 Chronic obstructive pulmonary disease, unspecified: Secondary | ICD-10-CM | POA: Diagnosis not present

## 2024-02-17 DIAGNOSIS — J449 Chronic obstructive pulmonary disease, unspecified: Secondary | ICD-10-CM | POA: Diagnosis not present

## 2024-02-22 DIAGNOSIS — J449 Chronic obstructive pulmonary disease, unspecified: Secondary | ICD-10-CM | POA: Diagnosis not present

## 2024-02-24 DIAGNOSIS — J449 Chronic obstructive pulmonary disease, unspecified: Secondary | ICD-10-CM | POA: Diagnosis not present

## 2024-02-29 DIAGNOSIS — J449 Chronic obstructive pulmonary disease, unspecified: Secondary | ICD-10-CM | POA: Diagnosis not present

## 2024-03-02 DIAGNOSIS — J449 Chronic obstructive pulmonary disease, unspecified: Secondary | ICD-10-CM | POA: Diagnosis not present

## 2024-03-07 DIAGNOSIS — J449 Chronic obstructive pulmonary disease, unspecified: Secondary | ICD-10-CM | POA: Diagnosis not present

## 2024-03-09 DIAGNOSIS — J449 Chronic obstructive pulmonary disease, unspecified: Secondary | ICD-10-CM | POA: Diagnosis not present

## 2024-03-09 DIAGNOSIS — J9611 Chronic respiratory failure with hypoxia: Secondary | ICD-10-CM | POA: Diagnosis not present

## 2024-03-17 DIAGNOSIS — J9611 Chronic respiratory failure with hypoxia: Secondary | ICD-10-CM | POA: Diagnosis not present

## 2024-03-17 DIAGNOSIS — J449 Chronic obstructive pulmonary disease, unspecified: Secondary | ICD-10-CM | POA: Diagnosis not present

## 2024-03-23 ENCOUNTER — Telehealth: Payer: Self-pay | Admitting: Acute Care

## 2024-03-23 NOTE — Telephone Encounter (Signed)
 Spoke with patient to schedule LDCT. Patient declined stating he has changed pulmonary providers after Dr. Brenna left and he will have other scans with that provider. Order cancelled

## 2024-04-19 DIAGNOSIS — J449 Chronic obstructive pulmonary disease, unspecified: Secondary | ICD-10-CM | POA: Diagnosis not present

## 2024-04-19 DIAGNOSIS — J9612 Chronic respiratory failure with hypercapnia: Secondary | ICD-10-CM | POA: Diagnosis not present

## 2024-06-16 DIAGNOSIS — R7309 Other abnormal glucose: Secondary | ICD-10-CM | POA: Diagnosis not present

## 2024-06-16 DIAGNOSIS — E785 Hyperlipidemia, unspecified: Secondary | ICD-10-CM | POA: Diagnosis not present

## 2024-06-16 DIAGNOSIS — Z23 Encounter for immunization: Secondary | ICD-10-CM | POA: Diagnosis not present

## 2024-06-20 ENCOUNTER — Ambulatory Visit: Payer: Medicare Other | Admitting: Nurse Practitioner

## 2024-07-20 DIAGNOSIS — J9611 Chronic respiratory failure with hypoxia: Secondary | ICD-10-CM | POA: Diagnosis not present

## 2024-07-20 DIAGNOSIS — J449 Chronic obstructive pulmonary disease, unspecified: Secondary | ICD-10-CM | POA: Diagnosis not present
